# Patient Record
Sex: Female | Born: 1945
Health system: Southern US, Community
[De-identification: ages and names within clinical notes are randomized; demographics above are authoritative.]

## PROBLEM LIST (undated history)

## (undated) DIAGNOSIS — Z9889 Other specified postprocedural states: Secondary | ICD-10-CM

## (undated) DIAGNOSIS — I10 Essential (primary) hypertension: Secondary | ICD-10-CM

## (undated) DIAGNOSIS — M545 Low back pain, unspecified: Secondary | ICD-10-CM

## (undated) DIAGNOSIS — E7800 Pure hypercholesterolemia, unspecified: Secondary | ICD-10-CM

## (undated) DIAGNOSIS — D649 Anemia, unspecified: Secondary | ICD-10-CM

## (undated) DIAGNOSIS — T7840XA Allergy, unspecified, initial encounter: Secondary | ICD-10-CM

## (undated) DIAGNOSIS — R112 Nausea with vomiting, unspecified: Secondary | ICD-10-CM

## (undated) DIAGNOSIS — K635 Polyp of colon: Secondary | ICD-10-CM

## (undated) DIAGNOSIS — G8929 Other chronic pain: Secondary | ICD-10-CM

## (undated) DIAGNOSIS — M1990 Unspecified osteoarthritis, unspecified site: Secondary | ICD-10-CM

## (undated) DIAGNOSIS — W010XXA Fall on same level from slipping, tripping and stumbling without subsequent striking against object, initial encounter: Secondary | ICD-10-CM

## (undated) DIAGNOSIS — K219 Gastro-esophageal reflux disease without esophagitis: Secondary | ICD-10-CM

## (undated) DIAGNOSIS — Z8719 Personal history of other diseases of the digestive system: Secondary | ICD-10-CM

## (undated) HISTORY — DX: Allergy, unspecified, initial encounter: T78.40XA

## (undated) HISTORY — PX: RADIOFREQUENCY ABLATION NERVES: SUR1070

## (undated) HISTORY — PX: JOINT REPLACEMENT: SHX530

## (undated) HISTORY — DX: Essential (primary) hypertension: I10

## (undated) HISTORY — DX: Unspecified osteoarthritis, unspecified site: M19.90

## (undated) HISTORY — DX: Polyp of colon: K63.5

## (undated) HISTORY — PX: FRACTURE SURGERY: SHX138

---

## 1983-03-16 HISTORY — PX: ANKLE FRACTURE SURGERY: SHX122

## 1984-03-15 HISTORY — PX: ANKLE HARDWARE REMOVAL: SHX1149

## 1984-03-15 HISTORY — PX: FOOT FRACTURE SURGERY: SHX645

## 1997-03-15 HISTORY — PX: ABDOMINAL HYSTERECTOMY: SHX81

## 1997-05-08 ENCOUNTER — Inpatient Hospital Stay (HOSPITAL_COMMUNITY): Admission: RE | Admit: 1997-05-08 | Discharge: 1997-05-10 | Payer: Self-pay | Admitting: Gynecology

## 1997-06-07 ENCOUNTER — Ambulatory Visit (HOSPITAL_COMMUNITY): Admission: RE | Admit: 1997-06-07 | Discharge: 1997-06-07 | Payer: Self-pay | Admitting: Gynecology

## 1997-10-30 ENCOUNTER — Ambulatory Visit (HOSPITAL_COMMUNITY): Admission: RE | Admit: 1997-10-30 | Discharge: 1997-10-30 | Payer: Self-pay | Admitting: Gastroenterology

## 1998-06-13 ENCOUNTER — Other Ambulatory Visit: Admission: RE | Admit: 1998-06-13 | Discharge: 1998-06-13 | Payer: Self-pay | Admitting: Gynecology

## 1998-06-18 ENCOUNTER — Encounter: Payer: Self-pay | Admitting: Gynecology

## 1998-06-18 ENCOUNTER — Ambulatory Visit (HOSPITAL_COMMUNITY): Admission: RE | Admit: 1998-06-18 | Discharge: 1998-06-18 | Payer: Self-pay | Admitting: Gynecology

## 1999-03-16 HISTORY — PX: KNEE ARTHROSCOPY: SHX127

## 1999-11-03 ENCOUNTER — Ambulatory Visit (HOSPITAL_COMMUNITY): Admission: RE | Admit: 1999-11-03 | Discharge: 1999-11-03 | Payer: Self-pay | Admitting: Gynecology

## 1999-11-03 ENCOUNTER — Encounter: Payer: Self-pay | Admitting: Gynecology

## 2000-03-15 HISTORY — PX: HAMMER TOE SURGERY: SHX385

## 2000-11-15 ENCOUNTER — Encounter: Payer: Self-pay | Admitting: Gynecology

## 2000-11-15 ENCOUNTER — Ambulatory Visit (HOSPITAL_COMMUNITY): Admission: RE | Admit: 2000-11-15 | Discharge: 2000-11-15 | Payer: Self-pay | Admitting: Gynecology

## 2002-01-30 ENCOUNTER — Encounter: Payer: Self-pay | Admitting: Gynecology

## 2002-01-30 ENCOUNTER — Ambulatory Visit (HOSPITAL_COMMUNITY): Admission: RE | Admit: 2002-01-30 | Discharge: 2002-01-30 | Payer: Self-pay | Admitting: Gynecology

## 2002-10-05 ENCOUNTER — Other Ambulatory Visit: Admission: RE | Admit: 2002-10-05 | Discharge: 2002-10-05 | Payer: Self-pay | Admitting: Gynecology

## 2003-02-12 ENCOUNTER — Ambulatory Visit (HOSPITAL_COMMUNITY): Admission: RE | Admit: 2003-02-12 | Discharge: 2003-02-12 | Payer: Self-pay | Admitting: Gynecology

## 2004-02-18 ENCOUNTER — Ambulatory Visit (HOSPITAL_COMMUNITY): Admission: RE | Admit: 2004-02-18 | Discharge: 2004-02-18 | Payer: Self-pay | Admitting: Gynecology

## 2005-03-30 ENCOUNTER — Ambulatory Visit (HOSPITAL_COMMUNITY): Admission: RE | Admit: 2005-03-30 | Discharge: 2005-03-30 | Payer: Self-pay | Admitting: Gynecology

## 2006-04-25 ENCOUNTER — Ambulatory Visit (HOSPITAL_COMMUNITY): Admission: RE | Admit: 2006-04-25 | Discharge: 2006-04-25 | Payer: Self-pay | Admitting: Gynecology

## 2006-06-06 ENCOUNTER — Other Ambulatory Visit: Admission: RE | Admit: 2006-06-06 | Discharge: 2006-06-06 | Payer: Self-pay | Admitting: Gynecology

## 2007-05-02 ENCOUNTER — Ambulatory Visit (HOSPITAL_COMMUNITY): Admission: RE | Admit: 2007-05-02 | Discharge: 2007-05-02 | Payer: Self-pay | Admitting: Gynecology

## 2007-08-14 HISTORY — PX: COLONOSCOPY: SHX174

## 2008-05-16 ENCOUNTER — Ambulatory Visit (HOSPITAL_COMMUNITY): Admission: RE | Admit: 2008-05-16 | Discharge: 2008-05-16 | Payer: Self-pay | Admitting: Gynecology

## 2008-05-20 ENCOUNTER — Ambulatory Visit: Payer: Self-pay | Admitting: Gynecology

## 2008-05-20 ENCOUNTER — Other Ambulatory Visit: Admission: RE | Admit: 2008-05-20 | Discharge: 2008-05-20 | Payer: Self-pay | Admitting: Gynecology

## 2008-05-20 ENCOUNTER — Encounter: Payer: Self-pay | Admitting: Gynecology

## 2008-08-02 ENCOUNTER — Ambulatory Visit: Payer: Self-pay | Admitting: Gynecology

## 2009-06-12 ENCOUNTER — Ambulatory Visit (HOSPITAL_COMMUNITY): Admission: RE | Admit: 2009-06-12 | Discharge: 2009-06-12 | Payer: Self-pay | Admitting: Gynecology

## 2009-07-15 ENCOUNTER — Encounter: Admission: RE | Admit: 2009-07-15 | Discharge: 2009-07-15 | Payer: Self-pay | Admitting: Orthopedic Surgery

## 2009-08-06 ENCOUNTER — Encounter: Admission: RE | Admit: 2009-08-06 | Discharge: 2009-08-06 | Payer: Self-pay | Admitting: Orthopedic Surgery

## 2009-09-25 ENCOUNTER — Encounter: Admission: RE | Admit: 2009-09-25 | Discharge: 2009-09-25 | Payer: Self-pay | Admitting: Orthopedic Surgery

## 2009-10-13 DIAGNOSIS — K635 Polyp of colon: Secondary | ICD-10-CM | POA: Insufficient documentation

## 2009-10-13 HISTORY — DX: Polyp of colon: K63.5

## 2010-01-19 ENCOUNTER — Encounter: Admission: RE | Admit: 2010-01-19 | Discharge: 2010-01-19 | Payer: Self-pay | Admitting: Neurosurgery

## 2010-03-23 ENCOUNTER — Encounter
Admission: RE | Admit: 2010-03-23 | Discharge: 2010-03-23 | Payer: Self-pay | Source: Home / Self Care | Attending: Neurosurgery | Admitting: Neurosurgery

## 2010-06-05 ENCOUNTER — Other Ambulatory Visit: Payer: Self-pay | Admitting: Gynecology

## 2010-06-05 DIAGNOSIS — Z1231 Encounter for screening mammogram for malignant neoplasm of breast: Secondary | ICD-10-CM

## 2010-06-15 ENCOUNTER — Ambulatory Visit (HOSPITAL_COMMUNITY)
Admission: RE | Admit: 2010-06-15 | Discharge: 2010-06-15 | Disposition: A | Discharge status: Home / Self Care | Payer: Medicare Other | Source: Ambulatory Visit | Attending: Gynecology | Admitting: Gynecology

## 2010-06-15 DIAGNOSIS — Z1231 Encounter for screening mammogram for malignant neoplasm of breast: Secondary | ICD-10-CM

## 2010-06-17 ENCOUNTER — Other Ambulatory Visit: Payer: Self-pay | Admitting: Gynecology

## 2010-06-17 DIAGNOSIS — R928 Other abnormal and inconclusive findings on diagnostic imaging of breast: Secondary | ICD-10-CM

## 2010-06-19 ENCOUNTER — Ambulatory Visit
Admission: RE | Admit: 2010-06-19 | Discharge: 2010-06-19 | Disposition: A | Discharge status: Home / Self Care | Payer: Medicare Other | Source: Ambulatory Visit | Attending: Gynecology | Admitting: Gynecology

## 2010-06-19 DIAGNOSIS — R928 Other abnormal and inconclusive findings on diagnostic imaging of breast: Secondary | ICD-10-CM

## 2010-06-23 ENCOUNTER — Other Ambulatory Visit: Payer: Self-pay | Admitting: Orthopedic Surgery

## 2010-06-23 DIAGNOSIS — IMO0002 Reserved for concepts with insufficient information to code with codable children: Secondary | ICD-10-CM

## 2010-06-25 ENCOUNTER — Ambulatory Visit
Admission: RE | Admit: 2010-06-25 | Discharge: 2010-06-25 | Disposition: A | Discharge status: Home / Self Care | Payer: Medicare Other | Source: Ambulatory Visit | Attending: Orthopedic Surgery | Admitting: Orthopedic Surgery

## 2010-06-25 DIAGNOSIS — IMO0002 Reserved for concepts with insufficient information to code with codable children: Secondary | ICD-10-CM

## 2010-07-16 ENCOUNTER — Other Ambulatory Visit: Payer: Self-pay | Admitting: Orthopedic Surgery

## 2010-07-16 DIAGNOSIS — IMO0002 Reserved for concepts with insufficient information to code with codable children: Secondary | ICD-10-CM

## 2010-07-17 ENCOUNTER — Other Ambulatory Visit: Payer: Self-pay | Admitting: Orthopedic Surgery

## 2010-07-17 ENCOUNTER — Ambulatory Visit
Admission: RE | Admit: 2010-07-17 | Discharge: 2010-07-17 | Disposition: A | Discharge status: Home / Self Care | Payer: PRIVATE HEALTH INSURANCE | Source: Ambulatory Visit | Attending: Orthopedic Surgery | Admitting: Orthopedic Surgery

## 2010-07-17 DIAGNOSIS — M47816 Spondylosis without myelopathy or radiculopathy, lumbar region: Secondary | ICD-10-CM

## 2010-07-17 DIAGNOSIS — IMO0002 Reserved for concepts with insufficient information to code with codable children: Secondary | ICD-10-CM

## 2011-03-16 HISTORY — PX: POSTERIOR LUMBAR FUSION: SHX6036

## 2011-03-22 DIAGNOSIS — M545 Low back pain, unspecified: Secondary | ICD-10-CM | POA: Diagnosis not present

## 2011-03-22 DIAGNOSIS — IMO0002 Reserved for concepts with insufficient information to code with codable children: Secondary | ICD-10-CM | POA: Diagnosis not present

## 2011-03-23 ENCOUNTER — Ambulatory Visit (INDEPENDENT_AMBULATORY_CARE_PROVIDER_SITE_OTHER): Payer: Medicare Other

## 2011-03-23 DIAGNOSIS — L241 Irritant contact dermatitis due to oils and greases: Secondary | ICD-10-CM

## 2011-04-12 DIAGNOSIS — M545 Low back pain, unspecified: Secondary | ICD-10-CM | POA: Diagnosis not present

## 2011-04-12 DIAGNOSIS — IMO0002 Reserved for concepts with insufficient information to code with codable children: Secondary | ICD-10-CM | POA: Diagnosis not present

## 2011-05-03 DIAGNOSIS — M25569 Pain in unspecified knee: Secondary | ICD-10-CM | POA: Diagnosis not present

## 2011-05-03 DIAGNOSIS — M1710 Unilateral primary osteoarthritis, unspecified knee: Secondary | ICD-10-CM | POA: Diagnosis not present

## 2011-06-04 ENCOUNTER — Other Ambulatory Visit: Payer: Self-pay | Admitting: Gynecology

## 2011-06-04 DIAGNOSIS — Z1231 Encounter for screening mammogram for malignant neoplasm of breast: Secondary | ICD-10-CM

## 2011-06-07 DIAGNOSIS — H25019 Cortical age-related cataract, unspecified eye: Secondary | ICD-10-CM | POA: Diagnosis not present

## 2011-06-07 DIAGNOSIS — H0230 Blepharochalasis unspecified eye, unspecified eyelid: Secondary | ICD-10-CM | POA: Diagnosis not present

## 2011-06-10 DIAGNOSIS — M545 Low back pain, unspecified: Secondary | ICD-10-CM | POA: Diagnosis not present

## 2011-06-10 DIAGNOSIS — M4310 Spondylolisthesis, site unspecified: Secondary | ICD-10-CM | POA: Diagnosis not present

## 2011-06-10 DIAGNOSIS — M5137 Other intervertebral disc degeneration, lumbosacral region: Secondary | ICD-10-CM | POA: Diagnosis not present

## 2011-06-29 DIAGNOSIS — M25569 Pain in unspecified knee: Secondary | ICD-10-CM | POA: Diagnosis not present

## 2011-06-29 DIAGNOSIS — M1710 Unilateral primary osteoarthritis, unspecified knee: Secondary | ICD-10-CM | POA: Diagnosis not present

## 2011-06-30 ENCOUNTER — Ambulatory Visit (HOSPITAL_COMMUNITY)
Admission: RE | Admit: 2011-06-30 | Discharge: 2011-06-30 | Disposition: A | Discharge status: Home / Self Care | Payer: Medicare Other | Source: Ambulatory Visit | Attending: Gynecology | Admitting: Gynecology

## 2011-06-30 DIAGNOSIS — Z1231 Encounter for screening mammogram for malignant neoplasm of breast: Secondary | ICD-10-CM | POA: Insufficient documentation

## 2011-07-06 DIAGNOSIS — M1710 Unilateral primary osteoarthritis, unspecified knee: Secondary | ICD-10-CM | POA: Diagnosis not present

## 2011-07-13 DIAGNOSIS — M4310 Spondylolisthesis, site unspecified: Secondary | ICD-10-CM | POA: Diagnosis not present

## 2011-07-13 DIAGNOSIS — M545 Low back pain, unspecified: Secondary | ICD-10-CM | POA: Diagnosis not present

## 2011-07-13 DIAGNOSIS — M5137 Other intervertebral disc degeneration, lumbosacral region: Secondary | ICD-10-CM | POA: Diagnosis not present

## 2011-07-14 DIAGNOSIS — M1710 Unilateral primary osteoarthritis, unspecified knee: Secondary | ICD-10-CM | POA: Diagnosis not present

## 2011-07-16 ENCOUNTER — Ambulatory Visit (INDEPENDENT_AMBULATORY_CARE_PROVIDER_SITE_OTHER): Payer: Medicare Other | Admitting: Family Medicine

## 2011-07-16 VITALS — BP 150/75 | HR 62 | Temp 98.0°F | Resp 18 | Ht 69.0 in | Wt 214.0 lb

## 2011-07-16 DIAGNOSIS — G501 Atypical facial pain: Secondary | ICD-10-CM

## 2011-07-16 DIAGNOSIS — R519 Headache, unspecified: Secondary | ICD-10-CM

## 2011-07-21 DIAGNOSIS — M1710 Unilateral primary osteoarthritis, unspecified knee: Secondary | ICD-10-CM | POA: Diagnosis not present

## 2011-07-28 DIAGNOSIS — M1710 Unilateral primary osteoarthritis, unspecified knee: Secondary | ICD-10-CM | POA: Diagnosis not present

## 2011-08-11 DIAGNOSIS — IMO0002 Reserved for concepts with insufficient information to code with codable children: Secondary | ICD-10-CM | POA: Diagnosis not present

## 2011-08-11 DIAGNOSIS — M4310 Spondylolisthesis, site unspecified: Secondary | ICD-10-CM | POA: Diagnosis not present

## 2011-08-11 DIAGNOSIS — M5137 Other intervertebral disc degeneration, lumbosacral region: Secondary | ICD-10-CM | POA: Diagnosis not present

## 2011-08-16 DIAGNOSIS — M545 Low back pain, unspecified: Secondary | ICD-10-CM | POA: Diagnosis not present

## 2011-08-16 DIAGNOSIS — M4310 Spondylolisthesis, site unspecified: Secondary | ICD-10-CM | POA: Diagnosis not present

## 2011-08-16 DIAGNOSIS — IMO0002 Reserved for concepts with insufficient information to code with codable children: Secondary | ICD-10-CM | POA: Diagnosis not present

## 2011-09-13 DIAGNOSIS — M545 Low back pain, unspecified: Secondary | ICD-10-CM | POA: Diagnosis not present

## 2011-09-13 DIAGNOSIS — M4310 Spondylolisthesis, site unspecified: Secondary | ICD-10-CM | POA: Diagnosis not present

## 2011-09-13 DIAGNOSIS — IMO0002 Reserved for concepts with insufficient information to code with codable children: Secondary | ICD-10-CM | POA: Diagnosis not present

## 2011-09-23 DIAGNOSIS — M5137 Other intervertebral disc degeneration, lumbosacral region: Secondary | ICD-10-CM | POA: Diagnosis not present

## 2011-09-23 DIAGNOSIS — M1710 Unilateral primary osteoarthritis, unspecified knee: Secondary | ICD-10-CM | POA: Diagnosis not present

## 2011-10-15 ENCOUNTER — Encounter: Payer: Self-pay | Admitting: Family Medicine

## 2011-10-18 DIAGNOSIS — M5137 Other intervertebral disc degeneration, lumbosacral region: Secondary | ICD-10-CM | POA: Diagnosis not present

## 2011-10-18 DIAGNOSIS — M4310 Spondylolisthesis, site unspecified: Secondary | ICD-10-CM | POA: Diagnosis not present

## 2011-10-18 DIAGNOSIS — IMO0002 Reserved for concepts with insufficient information to code with codable children: Secondary | ICD-10-CM | POA: Diagnosis not present

## 2011-10-18 DIAGNOSIS — M545 Low back pain, unspecified: Secondary | ICD-10-CM | POA: Diagnosis not present

## 2011-11-03 DIAGNOSIS — M4310 Spondylolisthesis, site unspecified: Secondary | ICD-10-CM | POA: Diagnosis not present

## 2011-11-03 DIAGNOSIS — M5137 Other intervertebral disc degeneration, lumbosacral region: Secondary | ICD-10-CM | POA: Diagnosis not present

## 2011-11-03 DIAGNOSIS — M545 Low back pain, unspecified: Secondary | ICD-10-CM | POA: Diagnosis not present

## 2011-11-03 DIAGNOSIS — IMO0002 Reserved for concepts with insufficient information to code with codable children: Secondary | ICD-10-CM | POA: Diagnosis not present

## 2011-11-27 DIAGNOSIS — Z23 Encounter for immunization: Secondary | ICD-10-CM | POA: Diagnosis not present

## 2011-11-30 DIAGNOSIS — M1710 Unilateral primary osteoarthritis, unspecified knee: Secondary | ICD-10-CM | POA: Diagnosis not present

## 2011-11-30 DIAGNOSIS — M5137 Other intervertebral disc degeneration, lumbosacral region: Secondary | ICD-10-CM | POA: Diagnosis not present

## 2011-12-15 ENCOUNTER — Other Ambulatory Visit: Payer: Self-pay | Admitting: Rehabilitation

## 2011-12-15 ENCOUNTER — Ambulatory Visit
Admission: RE | Admit: 2011-12-15 | Discharge: 2011-12-15 | Disposition: A | Discharge status: Home / Self Care | Payer: Medicare Other | Source: Ambulatory Visit | Attending: Rehabilitation | Admitting: Rehabilitation

## 2011-12-15 ENCOUNTER — Other Ambulatory Visit: Payer: Medicare Other

## 2011-12-15 DIAGNOSIS — M545 Low back pain, unspecified: Secondary | ICD-10-CM | POA: Insufficient documentation

## 2011-12-15 DIAGNOSIS — M5137 Other intervertebral disc degeneration, lumbosacral region: Secondary | ICD-10-CM

## 2011-12-15 DIAGNOSIS — M48061 Spinal stenosis, lumbar region without neurogenic claudication: Secondary | ICD-10-CM | POA: Diagnosis not present

## 2011-12-15 DIAGNOSIS — M4310 Spondylolisthesis, site unspecified: Secondary | ICD-10-CM | POA: Diagnosis not present

## 2011-12-15 HISTORY — DX: Low back pain, unspecified: M54.50

## 2011-12-20 DIAGNOSIS — M4310 Spondylolisthesis, site unspecified: Secondary | ICD-10-CM | POA: Diagnosis not present

## 2011-12-20 DIAGNOSIS — M545 Low back pain, unspecified: Secondary | ICD-10-CM | POA: Diagnosis not present

## 2011-12-20 DIAGNOSIS — IMO0002 Reserved for concepts with insufficient information to code with codable children: Secondary | ICD-10-CM | POA: Diagnosis not present

## 2011-12-20 DIAGNOSIS — M5137 Other intervertebral disc degeneration, lumbosacral region: Secondary | ICD-10-CM | POA: Diagnosis not present

## 2011-12-24 ENCOUNTER — Encounter: Payer: Self-pay | Admitting: Women's Health

## 2011-12-24 ENCOUNTER — Ambulatory Visit (INDEPENDENT_AMBULATORY_CARE_PROVIDER_SITE_OTHER): Payer: Medicare Other | Admitting: Women's Health

## 2011-12-24 DIAGNOSIS — N898 Other specified noninflammatory disorders of vagina: Secondary | ICD-10-CM

## 2011-12-24 DIAGNOSIS — L293 Anogenital pruritus, unspecified: Secondary | ICD-10-CM

## 2011-12-24 LAB — WET PREP FOR TRICH, YEAST, CLUE
Clue Cells Wet Prep HPF POC: NONE SEEN
Trich, Wet Prep: NONE SEEN
Yeast Wet Prep HPF POC: NONE SEEN

## 2011-12-24 NOTE — Progress Notes (Signed)
Patient ID: Katherine Mcpherson, female   DOB: 03/31/1945, 66 y.o.   MRN: [id number] Presents with complaint of vaginal itching and burning with slight pelvic pressure for greater than one week. Burning its most problematic symptom. Used over-the-counter Monistat without relief. Denies any urinary symptoms, discharge or fever. History of a hysterectomy, on no ERT, not sexually active.  Exam: External genitalia erythematous at introitus, speculum exam vaginal walls atrophic with no noted discharge or erythema, wet prep negative. Bimanual no adnexal fullness or tenderness.  Atrophic vaginitis  Plan: Estrace vaginal cream half applicator twice weekly. Sample given with no prescription. Instructed to call or return if no relief of symptoms, has annual exam with Dr. Lily Peer next week and will evaluate.

## 2011-12-29 ENCOUNTER — Encounter: Payer: Self-pay | Admitting: Anesthesiology

## 2011-12-29 DIAGNOSIS — M1710 Unilateral primary osteoarthritis, unspecified knee: Secondary | ICD-10-CM | POA: Diagnosis not present

## 2011-12-30 ENCOUNTER — Encounter: Payer: Self-pay | Admitting: Gynecology

## 2011-12-30 ENCOUNTER — Ambulatory Visit (INDEPENDENT_AMBULATORY_CARE_PROVIDER_SITE_OTHER): Payer: Medicare Other | Admitting: Gynecology

## 2011-12-30 VITALS — BP 132/86 | Ht 67.25 in | Wt 212.0 lb

## 2011-12-30 DIAGNOSIS — E669 Obesity, unspecified: Secondary | ICD-10-CM

## 2011-12-30 DIAGNOSIS — I119 Hypertensive heart disease without heart failure: Secondary | ICD-10-CM

## 2011-12-30 DIAGNOSIS — E663 Overweight: Secondary | ICD-10-CM

## 2011-12-30 DIAGNOSIS — E66811 Obesity, class 1: Secondary | ICD-10-CM

## 2011-12-30 DIAGNOSIS — I10 Essential (primary) hypertension: Secondary | ICD-10-CM | POA: Diagnosis not present

## 2011-12-30 DIAGNOSIS — N951 Menopausal and female climacteric states: Secondary | ICD-10-CM

## 2011-12-30 DIAGNOSIS — N952 Postmenopausal atrophic vaginitis: Secondary | ICD-10-CM | POA: Diagnosis not present

## 2011-12-30 DIAGNOSIS — Z23 Encounter for immunization: Secondary | ICD-10-CM | POA: Diagnosis not present

## 2011-12-30 HISTORY — DX: Obesity, unspecified: E66.9

## 2011-12-30 HISTORY — DX: Postmenopausal atrophic vaginitis: N95.2

## 2011-12-30 HISTORY — DX: Hypertensive heart disease without heart failure: I11.9

## 2011-12-30 HISTORY — DX: Menopausal and female climacteric states: N95.1

## 2011-12-30 HISTORY — DX: Obesity, class 1: E66.811

## 2011-12-30 MED ORDER — ESTRADIOL 0.1 MG/GM VA CREA: TOPICAL_CREAM | VAGINAL | Status: DC

## 2011-12-30 NOTE — Progress Notes (Signed)
Katherine Mcpherson [birth year] [id number]   History:    66 y.o.  who is postmenopausal has been complaining of vaginal dryness and irritation. Review of her record indicated her last time she was seen in the office was in 2010 and had a normal Pap smear. Patient with no prior history of abnormal Pap smears. Patient had a colonoscopy 2011 whereby: Polyp was identified and was benign. Dr. Yong Channel is her primary physician will be drawn her lab work in the next couple weeks. Her last mammogram was this year which was normal. Patient does her monthly self breast examination. Patient with prior history of total abdominal hysterectomy with bilateral salpingo-oophorectomy. Patient has already received previously her flu vaccine. Mother with history of osteoporosis. Patient does not recall ever having received a Tdap vaccine. Review of her record indicates she was weighing 231 pounds down to 212. Blood pressure 132/88.  Past medical history,surgical history, family history and social history were all reviewed and documented in the EPIC chart.  Gynecologic History No LMP recorded. Patient has had a hysterectomy. Contraception: none and Prior hysterectomy Last Pap: 2010. Results were: normal Last mammogram: 2013. Results were: normal  Obstetric History OB History    Grav Para Term Preterm Abortions TAB SAB Ect Mult Living   0                ROS: A ROS was performed and pertinent positives and negatives are included in the history.  GENERAL: No fevers or chills. HEENT: No change in vision, no earache, sore throat or sinus congestion. NECK: No pain or stiffness. CARDIOVASCULAR: No chest pain or pressure. No palpitations. PULMONARY: No shortness of breath, cough or wheeze. GASTROINTESTINAL: No abdominal pain, nausea, vomiting or diarrhea, melena or bright red blood per rectum. GENITOURINARY: No urinary frequency, urgency, hesitancy or dysuria. MUSCULOSKELETAL: No joint or muscle pain, no back pain, no recent  trauma. DERMATOLOGIC: No rash, no itching, no lesions. ENDOCRINE: No polyuria, polydipsia, no heat or cold intolerance. No recent change in weight. HEMATOLOGICAL: No anemia or easy bruising or bleeding. NEUROLOGIC: No headache, seizures, numbness, tingling or weakness. PSYCHIATRIC: No depression, no loss of interest in normal activity or change in sleep pattern.     Exam: chaperone present  BP 132/86  Ht 5' 7.25" (1.708 m)  Wt 212 lb (96.163 kg)  BMI 32.96 kg/m2  Body mass index is 32.96 kg/(m^2).  General appearance : Well developed well nourished female. No acute distress HEENT: Neck supple, trachea midline, no carotid bruits, no thyroidmegaly Lungs: Clear to auscultation, no rhonchi or wheezes, or rib retractions  Heart: Regular rate and rhythm, no murmurs or gallops Breast:Examined in sitting and supine position were symmetrical in appearance, no palpable masses or tenderness,  no skin retraction, no nipple inversion, no nipple discharge, no skin discoloration, no axillary or supraclavicular lymphadenopathy Abdomen: no palpable masses or tenderness, no rebound or guarding Extremities: no edema or skin discoloration or tenderness  Pelvic:  Bartholin, Urethra, Skene Glands: Within normal limits             Vagina: No gross lesions or discharge  Cervix: Absent  Uterus  Absent  Adnexa  Without masses or tenderness  Anus and perineum  normal   Rectovaginal  normal sphincter tone without palpated masses or tenderness             Hemoccult cards provided     Assessment/Plan:  66 y.o. postmenopausal with evidence of vaginal atrophy. Patient will be placed on Estrace vaginal  cream to apply twice a week. She will schedule her bone density study. Her Tdap vaccine was administered today. She was reminded to do her monthly self breast examination. We discussed importance of calcium and vitamin D with regular exercise for osteoporosis prevention. Prescription for Estrace vaginal cream to apply  intravaginally twice a week was provided. Patient with no prior history of abnormal Pap smear. Patient with prior history of total abdominal hysterectomy and bilateral salpingo-oophorectomy for benign pathology and would know prior history of abnormal Pap smears will no longer need  Pap smears according to the new guidelines. Patient was reminded to submit to the office for Hemoccult cards for testing.    Ok Edwards MD, 4:00 PM 12/30/2011

## 2011-12-30 NOTE — Patient Instructions (Addendum)
Tetanus, Diphtheria, Pertussis (Tdap) Vaccine What You Need to Know WHY GET VACCINATED? Tetanus, diphtheria and pertussis can be very serious diseases, even for adolescents and adults. Tdap vaccine can protect us from these diseases. TETANUS (Lockjaw) causes painful muscle tightening and stiffness, usually all over the body.  It can lead to tightening of muscles in the head and neck so you can't open your mouth, swallow, or sometimes even breathe. Tetanus kills about 1 out of 5 people who are infected. DIPHTHERIA can cause a thick coating to form in the back of the throat.  It can lead to breathing problems, paralysis, heart failure, and death. PERTUSSIS (Whooping Cough) causes severe coughing spells, which can cause difficulty breathing, vomiting and disturbed sleep.  It can also lead to weight loss, incontinence, and rib fractures. Up to 2 in 100 adolescents and 5 in 100 adults with pertussis are hospitalized or have complications, which could include pneumonia and death. These diseases are caused by bacteria. Diphtheria and pertussis are spread from person to person through coughing or sneezing. Tetanus enters the body through cuts, scratches, or wounds. Before vaccines, the [place] saw as many as 200,000 cases a year of diphtheria and pertussis, and hundreds of cases of tetanus. Since vaccination began, tetanus and diphtheria have dropped by about 99% and pertussis by about 80%. TDAP VACCINE Tdap vaccine can protect adolescents and adults from tetanus, diphtheria, and pertussis. One dose of Tdap is routinely given at age 11 or 12. People who did not get Tdap at that age should get it as soon as possible. Tdap is especially important for health care professionals and anyone having close contact with a baby younger than 12 months. Pregnant women should get a dose of Tdap during every pregnancy, to protect the newborn from pertussis. Infants are most at risk for severe, life-threatening  complications from pertussis. A similar vaccine, called Td, protects from tetanus and diphtheria, but not pertussis. A Td booster should be given every 10 years. Tdap may be given as one of these boosters if you have not already gotten a dose. Tdap may also be given after a severe cut or burn to prevent tetanus infection. Your doctor can give you more information. Tdap may safely be given at the same time as other vaccines. SOME PEOPLE SHOULD NOT GET THIS VACCINE  If you ever had a life-threatening allergic reaction after a dose of any tetanus, diphtheria, or pertussis containing vaccine, OR if you have a severe allergy to any part of this vaccine, you should not get Tdap. Tell your doctor if you have any severe allergies.  If you had a coma, or long or multiple seizures within 7 days after a childhood dose of DTP or DTaP, you should not get Tdap, unless a cause other than the vaccine was found. You can still get Td.  Talk to your doctor if you:  have epilepsy or another nervous system problem,  had severe pain or swelling after any vaccine containing diphtheria, tetanus or pertussis,  ever had Guillain-Barr Syndrome (GBS),  aren't feeling well on the day the shot is scheduled. RISKS OF A VACCINE REACTION With any medicine, including vaccines, there is a chance of side effects. These are usually mild and go away on their own, but serious reactions are also possible. Brief fainting spells can follow a vaccination, leading to injuries from falling. Sitting or lying down for about 15 minutes can help prevent these. Tell your doctor if you feel dizzy or light-headed, or   have vision changes or ringing in the ears. Mild problems following Tdap (Did not interfere with activities)  Pain where the shot was given (about 3 in 4 adolescents or 2 in 3 adults)  Redness or swelling where the shot was given (about 1 person in 5)  Mild fever of at least 100.4F (up to about 1 in 25 adolescents or 1 in  100 adults)  Headache (about 3 or 4 people in 10)  Tiredness (about 1 person in 3 or 4)  Nausea, vomiting, diarrhea, stomach ache (up to 1 in 4 adolescents or 1 in 10 adults)  Chills, body aches, sore joints, rash, swollen glands (uncommon) Moderate problems following Tdap (Interfered with activities, but did not require medical attention)  Pain where the shot was given (about 1 in 5 adolescents or 1 in 100 adults)  Redness or swelling where the shot was given (up to about 1 in 16 adolescents or 1 in 25 adults)  Fever over 102F (about 1 in 100 adolescents or 1 in 250 adults)  Headache (about 3 in 20 adolescents or 1 in 10 adults)  Nausea, vomiting, diarrhea, stomach ache (up to 1 or 3 people in 100)  Swelling of the entire arm where the shot was given (up to about 3 in 100). Severe problems following Tdap (Unable to perform usual activities, required medical attention)  Swelling, severe pain, bleeding and redness in the arm where the shot was given (rare). A severe allergic reaction could occur after any vaccine (estimated less than 1 in a million doses). WHAT IF THERE IS A SERIOUS REACTION? What should I look for?  Look for anything that concerns you, such as signs of a severe allergic reaction, very high fever, or behavior changes. Signs of a severe allergic reaction can include hives, swelling of the face and throat, difficulty breathing, a fast heartbeat, dizziness, and weakness. These would start a few minutes to a few hours after the vaccination. What should I do?  If you think it is a severe allergic reaction or other emergency that can't wait, call 9-1-1 or get the person to the nearest hospital. Otherwise, call your doctor.  Afterward, the reaction should be reported to the "Vaccine Adverse Event Reporting System" (VAERS). Your doctor might file this report, or you can do it yourself through the VAERS web site at www.vaers.hhs.gov, or by calling 1-800-822-7967. VAERS is  only for reporting reactions. They do not give medical advice.  THE NATIONAL VACCINE INJURY COMPENSATION PROGRAM The National Vaccine Injury Compensation Program (VICP) is a federal program that was created to compensate people who may have been injured by certain vaccines. Persons who believe they may have been injured by a vaccine can learn about the program and about filing a claim by calling 1-800-338-2382 or visiting the VICP website at www.hrsa.gov/vaccinecompensation. HOW CAN I LEARN MORE?  Ask your doctor.  Call your local or state health department.  Contact the Centers for Disease Control and Prevention (CDC):  Call 1-800-232-4636 or visit CDC's website at www.cdc.gov/vaccines CDC Tdap Vaccine VIS (07/22/11) Document Released: 08/31/2011 Document Reviewed: 08/31/2011 [employer] Patient Information 2013 [employer].   Hormone Therapy At menopause, your body begins making less estrogen and progesterone hormones. This causes the body to stop having menstrual periods. This is because estrogen and progesterone hormones control your periods and menstrual cycle. A lack of estrogen may cause symptoms such as:  Hot flushes (or hot flashes).  Vaginal dryness.  Dry skin.  Loss of sex drive.    Risk of bone loss (osteoporosis). When this happens, you may choose to take hormone therapy to get back the estrogen lost during menopause. When the hormone estrogen is given alone, it is usually referred to as ET (Estrogen Therapy). When the hormone progestin is combined with estrogen, it is generally called HT (Hormone Therapy). This was formerly known as hormone replacement therapy (HRT). Your caregiver can help you make a decision on what will be best for you. The decision to use HT seems to change often as new studies are done. Many studies do not agree on the benefits of hormone replacement therapy. LIKELY BENEFITS OF HT INCLUDE PROTECTION FROM:  Hot Flushes (also called hot flashes) - A  hot flush is a sudden feeling of heat that spreads over the face and body. The skin may redden like a blush. It is connected with sweats and sleep disturbance. Women going through menopause may have hot flushes a few times a month or several times per day depending on the woman.  Osteoporosis (bone loss)- Estrogen helps guard against bone loss. After menopause, a woman's bones slowly lose calcium and become weak and brittle. As a result, bones are more likely to break. The hip, wrist, and spine are affected most often. Hormone therapy can help slow bone loss after menopause. Weight bearing exercise and taking calcium with vitamin D also can help prevent bone loss. There are also medications that your caregiver can prescribe that can help prevent osteoporosis.  Vaginal Dryness - Loss of estrogen causes changes in the vagina. Its lining may become thin and dry. These changes can cause pain and bleeding during sexual intercourse. Dryness can also lead to infections. This can cause burning and itching. (Vaginal estrogen treatment can help relieve pain, itching, and dryness.)  Urinary Tract Infections are more common after menopause because of lack of estrogen. Some women also develop urinary incontinence because of low estrogen levels in the vagina and bladder.  Possible other benefits of estrogen include a positive effect on mood and short-term memory in women. RISKS AND COMPLICATIONS  Using estrogen alone without progesterone causes the lining of the uterus to grow. This increases the risk of lining of the uterus (endometrial) cancer. Your caregiver should give another hormone called progestin if you have a uterus.  Women who take combined (estrogen and progestin) HT appear to have an increased risk of breast cancer. The risk appears to be small, but increases throughout the time that HT is taken.  Combined therapy also makes the breast tissue slightly denser which makes it harder to read mammograms  (breast X-rays).  Combined, estrogen and progesterone therapy can be taken together every day, in which case there may be spotting of blood. HT therapy can be taken cyclically in which case you will have menstrual periods. Cyclically means HT is taken for a set amount of days, then not taken, then this process is repeated.  HT may increase the risk of stroke, heart attack, breast cancer and forming blood clots in your leg.  Transdermal estrogen (estrogen that is absorbed through the skin with a patch or a cream) may have more positive results with:  Cholesterol.  Blood pressure.  Blood clots. Having the following conditions may indicate you should not have HT:  Endometrial cancer.  Liver disease.  Breast cancer.  Heart disease.  History of blood clots.  Stroke. TREATMENT   If you choose to take HT and have a uterus, usually estrogen and progestin are prescribed.  Your caregiver will   help you decide the best way to take the medications.  Possible ways to take estrogen include:  Pills.  Patches.  Gels.  Sprays.  Vaginal estrogen cream, rings and tablets.  It is best to take the lowest dose possible that will help your symptoms and take them for the shortest period of time that you can.  Hormone therapy can help relieve some of the problems (symptoms) that affect women at menopause. Before making a decision about HT, talk to your caregiver about what is best for you. Be well informed and comfortable with your decisions. HOME CARE INSTRUCTIONS   Follow your caregivers advice when taking the medications.  A Pap test is done to screen for cervical cancer.  The first Pap test should be done at age 21.  Between ages 21 and 29, Pap tests are repeated every 2 years.  Beginning at age 30, you are advised to have a Pap test every 3 years as long as your past 3 Pap tests have been normal.  Some women have medical problems that increase the chance of getting cervical  cancer. Talk to your caregiver about these problems. It is especially important to talk to your caregiver if a new problem develops soon after your last Pap test. In these cases, your caregiver may recommend more frequent screening and Pap tests.  The above recommendations are the same for women who have or have not gotten the vaccine for HPV (Human Papillomavirus).  If you had a hysterectomy for a problem that was not a cancer or a condition that could lead to cancer, then you no longer need Pap tests. However, even if you no longer need a Pap test, a regular exam is a good idea to make sure no other problems are starting.   If you are between ages 65 and 70, and you have had normal Pap tests going back 10 years, you no longer need Pap tests. However, even if you no longer need a Pap test, a regular exam is a good idea to make sure no other problems are starting.   If you have had past treatment for cervical cancer or a condition that could lead to cancer, you need Pap tests and screening for cancer for at least 20 years after your treatment.  If Pap tests have been discontinued, risk factors (such as a new sexual partner) need to be re-assessed to determine if screening should be resumed.  Some women may need screenings more often if they are at high risk for cervical cancer.  Get mammograms done as per the advice of your caregiver. SEEK IMMEDIATE MEDICAL CARE IF:  You develop abnormal vaginal bleeding.  You have pain or swelling in your legs, shortness of breath, or chest pain.  You develop dizziness or headaches.  You have lumps or changes in your breasts or armpits.  You have slurred speech.  You develop weakness or numbness of your arms or legs.  You have pain, burning, or bleeding when urinating.  You develop abdominal pain. Document Released: 11/28/2002 Document Revised: 05/24/2011 Document Reviewed: 03/18/2010 Exit[employer] Patient Information 2013 [employer].  

## 2012-01-04 ENCOUNTER — Encounter: Payer: Self-pay | Admitting: Family Medicine

## 2012-01-04 DIAGNOSIS — M545 Low back pain, unspecified: Secondary | ICD-10-CM

## 2012-01-05 ENCOUNTER — Encounter: Payer: Self-pay | Admitting: Gynecology

## 2012-01-05 DIAGNOSIS — M1710 Unilateral primary osteoarthritis, unspecified knee: Secondary | ICD-10-CM | POA: Diagnosis not present

## 2012-01-10 ENCOUNTER — Telehealth: Payer: Self-pay | Admitting: Family Medicine

## 2012-01-10 NOTE — Telephone Encounter (Signed)
This is an office note from 07/2011 see by Dr. Conley Rolls when epic went down.  HPI: 67 y/o female  with 1 day h/o  right facial pain described as aching, 3-8/10 , intermittent pain. Possible popping and clicking. + right ear pain, and sinus pressure and underneath eye pain. + sensitivity in area with drinking hot and cold foods. She had dental work done 6 weeks ago, she denies any HA or confusion. She has a steroid injection by Dr. Shawnee Knapp 4 days ago. She has had a h/p shingles in scalp in 2007/2008. She has tried Tylenol and Relafen without relief. So she took oxycodone which helped. Denies h/o temproal arteritis. Denies jaw pain with opening and closing of moth but feels a pop, click. Denies CVA sxs.   Please see paper chart for full ROS, PMH, SHX, and FHx.  Please see paper chart for VS PE: Full PE in paper chart GEN: NCAT HEENT: n+ slightly erythematous ext canal, dentition is good, + pop and click of TMJ bialterally, no swelling or redness, nontender along temporal artery CV-RRR, no MRG Lungs: CTAB Abd: soft NT, no masses, no hepato or splenomegaly, + BS Neuro: CN 2-12 grossly nl, + sensation face, nontender  A/P: ? Etiology right facial pain ( dental vs TMJ vs sinusitis related otlagia vs trigeminal neuralgia vs temporal arteritis). I think it is less likely that it is TN or TA.  Monitor sxs If worsen then f/u prn Continue with gabapentin she already has, will call me in AM with sxs  F/u with dentist, consider abx if sxs worsen with fevers, or s/sx of infection

## 2012-01-11 DIAGNOSIS — M1710 Unilateral primary osteoarthritis, unspecified knee: Secondary | ICD-10-CM | POA: Diagnosis not present

## 2012-01-11 NOTE — Progress Notes (Signed)
This is an office note from 07/2011 see by Dr. Aarion Metzgar when epic went down.  HPI: 65 y/o female  with 1 day h/o  right facial pain described as aching, 3-8/10 , intermittent pain. Possible popping and clicking. + right ear pain, and sinus pressure and underneath eye pain. + sensitivity in area with drinking hot and cold foods. She had dental work done 6 weeks ago, she denies any HA or confusion. She has a steroid injection by Dr. Solow 4 days ago. She has had a h/p shingles in scalp in 2007/2008. She has tried Tylenol and Relafen without relief. So she took oxycodone which helped. Denies h/o temproal arteritis. Denies jaw pain with opening and closing of moth but feels a pop, click. Denies CVA sxs.   Please see paper chart for full ROS, PMH, SHX, and FHx.  Please see paper chart for VS PE: Full PE in paper chart GEN: NCAT HEENT: n+ slightly erythematous ext canal, dentition is good, + pop and click of TMJ bialterally, no swelling or redness, nontender along temporal artery CV-RRR, no MRG Lungs: CTAB Abd: soft NT, no masses, no hepato or splenomegaly, + BS Neuro: CN 2-12 grossly nl, + sensation face, nontender  A/P: ? Etiology right facial pain ( dental vs TMJ vs sinusitis related otlagia vs trigeminal neuralgia vs temporal arteritis). I think it is less likely that it is TN or TA.  Monitor sxs If worsen then f/u prn Continue with gabapentin she already has, will call me in AM with sxs  F/u with dentist, consider abx if sxs worsen with fevers, or s/sx of infection    

## 2012-01-14 HISTORY — PX: SPINE SURGERY: SHX786

## 2012-01-18 DIAGNOSIS — M1710 Unilateral primary osteoarthritis, unspecified knee: Secondary | ICD-10-CM | POA: Diagnosis not present

## 2012-01-25 DIAGNOSIS — M1710 Unilateral primary osteoarthritis, unspecified knee: Secondary | ICD-10-CM | POA: Diagnosis not present

## 2012-01-28 DIAGNOSIS — M545 Low back pain, unspecified: Secondary | ICD-10-CM | POA: Diagnosis not present

## 2012-01-28 DIAGNOSIS — M51379 Other intervertebral disc degeneration, lumbosacral region without mention of lumbar back pain or lower extremity pain: Secondary | ICD-10-CM | POA: Diagnosis present

## 2012-01-28 DIAGNOSIS — Z4789 Encounter for other orthopedic aftercare: Secondary | ICD-10-CM | POA: Diagnosis not present

## 2012-01-28 DIAGNOSIS — M47817 Spondylosis without myelopathy or radiculopathy, lumbosacral region: Secondary | ICD-10-CM | POA: Diagnosis not present

## 2012-01-28 DIAGNOSIS — IMO0002 Reserved for concepts with insufficient information to code with codable children: Secondary | ICD-10-CM | POA: Diagnosis not present

## 2012-01-28 DIAGNOSIS — M4310 Spondylolisthesis, site unspecified: Secondary | ICD-10-CM | POA: Diagnosis not present

## 2012-01-28 DIAGNOSIS — K449 Diaphragmatic hernia without obstruction or gangrene: Secondary | ICD-10-CM | POA: Diagnosis not present

## 2012-01-28 DIAGNOSIS — M4120 Other idiopathic scoliosis, site unspecified: Secondary | ICD-10-CM | POA: Diagnosis not present

## 2012-01-28 DIAGNOSIS — R110 Nausea: Secondary | ICD-10-CM | POA: Diagnosis not present

## 2012-01-28 DIAGNOSIS — I10 Essential (primary) hypertension: Secondary | ICD-10-CM | POA: Diagnosis not present

## 2012-01-28 DIAGNOSIS — Z981 Arthrodesis status: Secondary | ICD-10-CM | POA: Diagnosis not present

## 2012-01-28 DIAGNOSIS — M5137 Other intervertebral disc degeneration, lumbosacral region: Secondary | ICD-10-CM | POA: Diagnosis not present

## 2012-01-28 DIAGNOSIS — M5106 Intervertebral disc disorders with myelopathy, lumbar region: Secondary | ICD-10-CM | POA: Diagnosis not present

## 2012-01-28 DIAGNOSIS — M48061 Spinal stenosis, lumbar region without neurogenic claudication: Secondary | ICD-10-CM | POA: Diagnosis not present

## 2012-02-07 ENCOUNTER — Other Ambulatory Visit: Payer: Self-pay | Admitting: Internal Medicine

## 2012-02-09 DIAGNOSIS — M4310 Spondylolisthesis, site unspecified: Secondary | ICD-10-CM | POA: Diagnosis not present

## 2012-02-09 DIAGNOSIS — M48061 Spinal stenosis, lumbar region without neurogenic claudication: Secondary | ICD-10-CM | POA: Diagnosis not present

## 2012-02-09 DIAGNOSIS — M5137 Other intervertebral disc degeneration, lumbosacral region: Secondary | ICD-10-CM | POA: Diagnosis not present

## 2012-02-09 DIAGNOSIS — M5106 Intervertebral disc disorders with myelopathy, lumbar region: Secondary | ICD-10-CM | POA: Diagnosis not present

## 2012-02-28 DIAGNOSIS — M545 Low back pain, unspecified: Secondary | ICD-10-CM | POA: Diagnosis not present

## 2012-02-28 DIAGNOSIS — M48061 Spinal stenosis, lumbar region without neurogenic claudication: Secondary | ICD-10-CM | POA: Diagnosis not present

## 2012-03-10 DIAGNOSIS — M1710 Unilateral primary osteoarthritis, unspecified knee: Secondary | ICD-10-CM | POA: Diagnosis not present

## 2012-03-24 DIAGNOSIS — M1710 Unilateral primary osteoarthritis, unspecified knee: Secondary | ICD-10-CM | POA: Diagnosis not present

## 2012-03-30 DIAGNOSIS — M1710 Unilateral primary osteoarthritis, unspecified knee: Secondary | ICD-10-CM | POA: Diagnosis not present

## 2012-04-11 ENCOUNTER — Encounter: Payer: Self-pay | Admitting: Internal Medicine

## 2012-04-11 ENCOUNTER — Ambulatory Visit (INDEPENDENT_AMBULATORY_CARE_PROVIDER_SITE_OTHER): Payer: Medicare Other | Admitting: Family Medicine

## 2012-04-11 VITALS — BP 136/75 | HR 71 | Temp 97.6°F | Resp 18 | Ht 68.0 in | Wt 215.8 lb

## 2012-04-11 DIAGNOSIS — M5380 Other specified dorsopathies, site unspecified: Secondary | ICD-10-CM

## 2012-04-11 DIAGNOSIS — M545 Low back pain, unspecified: Secondary | ICD-10-CM

## 2012-04-11 DIAGNOSIS — I10 Essential (primary) hypertension: Secondary | ICD-10-CM

## 2012-04-11 DIAGNOSIS — M62830 Muscle spasm of back: Secondary | ICD-10-CM

## 2012-04-11 LAB — COMPREHENSIVE METABOLIC PANEL
ALT: 11 U/L (ref 0–35)
CO2: 27 mEq/L (ref 19–32)
Calcium: 9.1 mg/dL (ref 8.4–10.5)
Chloride: 106 mEq/L (ref 96–112)
Creat: 0.83 mg/dL (ref 0.50–1.10)
Glucose, Bld: 96 mg/dL (ref 70–99)
Total Bilirubin: 0.3 mg/dL (ref 0.3–1.2)
Total Protein: 6.5 g/dL (ref 6.0–8.3)

## 2012-04-11 LAB — POCT CBC
HCT, POC: 42.5 % (ref 37.7–47.9)
Hemoglobin: 12.7 g/dL (ref 12.2–16.2)
Lymph, poc: 1.8 (ref 0.6–3.4)
MCH, POC: 27.1 pg (ref 27–31.2)
MCHC: 29.9 g/dL — AB (ref 31.8–35.4)
MPV: 9 fL (ref 0–99.8)
RBC: 4.68 M/uL (ref 4.04–5.48)
WBC: 9.8 10*3/uL (ref 4.6–10.2)

## 2012-04-11 MED ORDER — LISINOPRIL 20 MG PO TABS: 20.0000 mg | ORAL_TABLET | Freq: Every day | ORAL | Status: DC

## 2012-04-11 NOTE — Progress Notes (Signed)
Subjective:    Patient ID: Katherine Mcpherson, female    DOB: March 06, 1946, 67 y.o.   MRN: [id number] Chief Complaint  Patient presents with  . Back Pain    lumbar area- constant pain hx of back surgery 9 weeks ago- not in the surgery area    HPI  Had back surgery 9 wks ago but really don't think this is realated as her back pain is on the opposite side - left side below ribs - at least 5-6 d ago, progressively worsened and today woke her from sleep - is constant pressure.  If she tries to lye down it is much worse.  If she lies on left side she can as long as her left leg is straigt but if she tries to bend her left leg it is worse - sitting here right now it is painful. Worse with walking yesterday.  No traumas since surgery.  She is taking stool softeners as she is on oxycodone for her back - yesterday she had weaned down to 7.5 bid - she is trying to wean off but had to take one in the middle of the night because of this pain.   Still having some constipation but resolved w/ dulculax.  Nml urination - no odor or color.  No n/v. Normal appetite.   She is not as active as she normally is because she is still recovering from her back surgery but she is trying to walk more. Unfortunately, he knees are also bone on bone.  Past Medical History  Diagnosis Date  . Hypertension   . Arthritis   . Polyp of colon 10/2009  . Allergy    Past Surgical History  Procedure Date  . Colonoscopy 08/2007    TUBULAR ADENOMAWITH DYSPLASIA  . Foot surgery 2002  . Knee surgery 2002  . Abdominal hysterectomy     TAH/BSO  . Ankle surgery   . Back surgery- fusion l5-s1 2013    Current Outpatient Prescriptions on File Prior to Visit  Medication Sig Dispense Refill  . cholecalciferol (VITAMIN D) 1000 UNITS tablet Take 1,000 Units by mouth daily.      Marland Kitchen lisinopril (PRINIVIL,ZESTRIL) 20 MG tablet Take 1 tablet (20 mg total) by mouth daily.  90 tablet  1  . Multiple Vitamin (MULTIVITAMIN) tablet Take 1 tablet by  mouth daily.      Marland Kitchen oxyCODONE-acetaminophen (PERCOCET) 7.5-325 MG per tablet Take 1 tablet by mouth every 4 (four) hours as needed.      Marland Kitchen estradiol (ESTRACE) 0.1 MG/GM vaginal cream Apply twice a week  42.5 g  10  . nabumetone (RELAFEN) 750 MG tablet Take 750 mg by mouth 2 (two) times daily.      . vitamin E 400 UNIT capsule Take 400 Units by mouth daily.       Allergies  Allergen Reactions  . Sulfa Antibiotics Other (See Comments)    Makes tongue feel funny and makes her feel like she will pass  Out.  Last for days.  . Penicillins Itching    Review of Systems  Constitutional: Positive for activity change. Negative for fever, chills and unexpected weight change.  Gastrointestinal: Positive for constipation. Negative for nausea, vomiting, abdominal pain, diarrhea, blood in stool and abdominal distention.  Genitourinary: Positive for flank pain. Negative for dysuria, urgency, frequency, hematuria, decreased urine volume, enuresis and difficulty urinating.  Musculoskeletal: Positive for myalgias, back pain, arthralgias and gait problem. Negative for joint swelling.  Skin: Negative for color change, rash  and wound.  Neurological: Negative for weakness and numbness.  Hematological: Negative for adenopathy. Does not bruise/bleed easily.  Psychiatric/Behavioral: Positive for sleep disturbance.      BP 136/75  Pulse 71  Temp 97.6 F (36.4 C) (Oral)  Resp 18  Ht 5\' 8"  (1.727 m)  Wt 215 lb 12.8 oz (97.886 kg)  BMI 32.81 kg/m2  SpO2 97% Objective:   Physical Exam  Constitutional: She is oriented to person, place, and time. She appears well-developed and well-nourished.  HENT:  Head: Normocephalic.  Eyes: Conjunctivae normal are normal. No scleral icterus.  Neck: Normal range of motion. Neck supple.  Cardiovascular: Normal rate, regular rhythm and normal heart sounds.   Pulmonary/Chest: Effort normal and breath sounds normal. No respiratory distress.  Abdominal: Soft. Normal appearance  and bowel sounds are normal. There is no hepatosplenomegaly. There is no tenderness. There is no rigidity, no rebound, no guarding and no CVA tenderness. No hernia.  Musculoskeletal: Normal range of motion. She exhibits no edema.       Right hip: Normal.       Left hip: Normal.       Lumbar back: She exhibits tenderness and pain. She exhibits normal range of motion, no swelling, no edema, no deformity, no laceration and no spasm.       Back:       Pain to palpation along small area of left low thoracic rib and distally as highlighted in diagram  Neurological: She is alert and oriented to person, place, and time. She has normal strength. No sensory deficit. She exhibits normal muscle tone. Coordination and gait normal.  Skin: Skin is warm, dry and intact. No rash noted. She is not diaphoretic. No erythema.  Psychiatric: She has a normal mood and affect. Her behavior is normal.          Results for orders placed in visit on 04/11/12  POCT CBC      Component Value Range   WBC 9.8  4.6 - 10.2 K/uL   Lymph, poc 1.8  0.6 - 3.4   POC LYMPH PERCENT 18.7  10 - 50 %L   MID (cbc) 0.5  0 - 0.9   POC MID % 4.9  0 - 12 %M   POC Granulocyte 7.5 (*) 2 - 6.9   Granulocyte percent 76.4  37 - 80 %G   RBC 4.68  4.04 - 5.48 M/uL   Hemoglobin 12.7  12.2 - 16.2 g/dL   HCT, POC 46.9  62.9 - 47.9 %   MCV 90.8  80 - 97 fL   MCH, POC 27.1  27 - 31.2 pg   MCHC 29.9 (*) 31.8 - 35.4 g/dL   RDW, POC 52.8     Platelet Count, POC 363  142 - 424 K/uL   MPV 9.0  0 - 99.8 fL    Assessment & Plan:  Left low thoracic back pain - I suspect this is muscular in origin. She was methocarbamol 750mg  q6 hrs rx'd to her from her prev back injury so she will restart that in addition to frequent heat and try home thermal patch, topical diclofenac, capsaicin, massage.  If she needs a refill,  Ok to call in here and I will refill even though it was not initially rx'ed from this office. If no improvement, come back or call.  Will initially try switching pt to a different muscle relaxer - maybe something more potent. If not improvement with that, rec RTC for further eval inc  urine, cmp, esr, and consider imaging - perhaps xray and [place].  Will cc this note to pt's neurosurg Dr. Noel Gerold as pt plans to make a f/u appt with his office if no improvement after trial of treatment for muscle spasm   HTN - refill lisinopril - check bmp

## 2012-04-11 NOTE — Patient Instructions (Addendum)
Myalgia, Adult Myalgia is the medical term for muscle pain. It is a symptom of many things. Nearly everyone at some time in their life has this. The most common cause for muscle pain is overuse or straining and more so when you are not in shape. Injuries and muscle bruises cause myalgias. Muscle pain without a history of injury can also be caused by a virus. It frequently comes along with the flu. Myalgia not caused by muscle strain can be present in a large number of infectious diseases. Some autoimmune diseases like lupus and fibromyalgia can cause muscle pain. Myalgia may be mild, or severe. SYMPTOMS  The symptoms of myalgia are simply muscle pain. Most of the time this is short lived and the pain goes away without treatment. DIAGNOSIS  Myalgia is diagnosed by your caregiver by taking your history. This means you tell him when the problems began, what they are, and what has been happening. If this has not been a long term problem, your caregiver may want to watch for a while to see what will happen. If it has been long term, they may want to do additional testing. TREATMENT  The treatment depends on what the underlying cause of the muscle pain is. Often anti-inflammatory medications will help. HOME CARE INSTRUCTIONS  If the pain in your muscles came from overuse, slow down your activities until the problems go away.  Myalgia from overuse of a muscle can be treated with alternating hot and cold packs on the muscle affected or with cold for the first couple days. If either heat or cold seems to make things worse, stop their use.  Apply ice to the sore area for 15 to 20 minutes, 3 to 4 times per day, while awake for the first 2 days of muscle soreness, or as directed. Put the ice in a plastic bag and place a towel between the bag of ice and your skin.  Only take over-the-counter or prescription medicines for pain, discomfort, or fever as directed by your caregiver.  Regular gentle exercise may help  if you are not active.  Stretching before strenuous exercise can help lower the risk of myalgia. It is normal when beginning an exercise regimen to feel some muscle pain after exercising. Muscles that have not been used frequently will be sore at first. If the pain is extreme, this may mean injury to a muscle. SEEK MEDICAL CARE IF:  You have an increase in muscle pain that is not relieved with medication.  You begin to run a temperature.  You develop nausea and vomiting.  You develop a stiff and painful neck.  You develop a rash.  You develop muscle pain after a tick bite.  You have continued muscle pain while working out even after you are in good condition. SEEK IMMEDIATE MEDICAL CARE IF: Any of your problems are getting worse and medications are not helping. MAKE SURE YOU:   Understand these instructions.  Will watch your condition.  Will get help right away if you are not doing well or get worse. Document Released: 01/21/2006 Document Revised: 05/24/2011 Document Reviewed: 04/12/2006 [employer] Patient Information 2013 [employer].  

## 2012-04-13 ENCOUNTER — Other Ambulatory Visit: Payer: Self-pay | Admitting: Family Medicine

## 2012-04-13 ENCOUNTER — Encounter: Payer: Self-pay | Admitting: Family Medicine

## 2012-04-13 MED ORDER — CARISOPRODOL 350 MG PO TABS: 350.0000 mg | ORAL_TABLET | Freq: Four times a day (QID) | ORAL | Status: DC | PRN

## 2012-04-13 MED ORDER — DICLOFENAC SODIUM 75 MG PO TBEC: 75.0000 mg | DELAYED_RELEASE_TABLET | Freq: Every day | ORAL | Status: DC

## 2012-04-29 ENCOUNTER — Encounter: Payer: Self-pay | Admitting: Internal Medicine

## 2012-04-29 ENCOUNTER — Other Ambulatory Visit: Payer: Self-pay

## 2012-05-01 NOTE — Telephone Encounter (Signed)
You do not need another shingles vaccine. Please let us know if you need anything further.

## 2012-05-03 DIAGNOSIS — M545 Low back pain, unspecified: Secondary | ICD-10-CM | POA: Diagnosis not present

## 2012-05-03 DIAGNOSIS — M48061 Spinal stenosis, lumbar region without neurogenic claudication: Secondary | ICD-10-CM | POA: Diagnosis not present

## 2012-05-03 DIAGNOSIS — IMO0001 Reserved for inherently not codable concepts without codable children: Secondary | ICD-10-CM | POA: Diagnosis not present

## 2012-05-03 DIAGNOSIS — M5106 Intervertebral disc disorders with myelopathy, lumbar region: Secondary | ICD-10-CM | POA: Diagnosis not present

## 2012-05-03 DIAGNOSIS — M4310 Spondylolisthesis, site unspecified: Secondary | ICD-10-CM | POA: Diagnosis not present

## 2012-05-09 DIAGNOSIS — Q762 Congenital spondylolisthesis: Secondary | ICD-10-CM | POA: Diagnosis not present

## 2012-05-09 DIAGNOSIS — M48061 Spinal stenosis, lumbar region without neurogenic claudication: Secondary | ICD-10-CM | POA: Diagnosis not present

## 2012-05-09 DIAGNOSIS — M4120 Other idiopathic scoliosis, site unspecified: Secondary | ICD-10-CM | POA: Diagnosis not present

## 2012-05-11 DIAGNOSIS — M4120 Other idiopathic scoliosis, site unspecified: Secondary | ICD-10-CM | POA: Diagnosis not present

## 2012-05-11 DIAGNOSIS — M48061 Spinal stenosis, lumbar region without neurogenic claudication: Secondary | ICD-10-CM | POA: Diagnosis not present

## 2012-05-11 DIAGNOSIS — Q762 Congenital spondylolisthesis: Secondary | ICD-10-CM | POA: Diagnosis not present

## 2012-05-12 DIAGNOSIS — Q762 Congenital spondylolisthesis: Secondary | ICD-10-CM | POA: Diagnosis not present

## 2012-05-12 DIAGNOSIS — M4120 Other idiopathic scoliosis, site unspecified: Secondary | ICD-10-CM | POA: Diagnosis not present

## 2012-05-12 DIAGNOSIS — M48061 Spinal stenosis, lumbar region without neurogenic claudication: Secondary | ICD-10-CM | POA: Diagnosis not present

## 2012-05-15 DIAGNOSIS — Q762 Congenital spondylolisthesis: Secondary | ICD-10-CM | POA: Diagnosis not present

## 2012-05-15 DIAGNOSIS — M4120 Other idiopathic scoliosis, site unspecified: Secondary | ICD-10-CM | POA: Diagnosis not present

## 2012-05-15 DIAGNOSIS — M48061 Spinal stenosis, lumbar region without neurogenic claudication: Secondary | ICD-10-CM | POA: Diagnosis not present

## 2012-05-17 DIAGNOSIS — Q762 Congenital spondylolisthesis: Secondary | ICD-10-CM | POA: Diagnosis not present

## 2012-05-17 DIAGNOSIS — M48061 Spinal stenosis, lumbar region without neurogenic claudication: Secondary | ICD-10-CM | POA: Diagnosis not present

## 2012-05-17 DIAGNOSIS — M4120 Other idiopathic scoliosis, site unspecified: Secondary | ICD-10-CM | POA: Diagnosis not present

## 2012-05-22 DIAGNOSIS — Q762 Congenital spondylolisthesis: Secondary | ICD-10-CM | POA: Diagnosis not present

## 2012-05-22 DIAGNOSIS — M4120 Other idiopathic scoliosis, site unspecified: Secondary | ICD-10-CM | POA: Diagnosis not present

## 2012-05-22 DIAGNOSIS — M48061 Spinal stenosis, lumbar region without neurogenic claudication: Secondary | ICD-10-CM | POA: Diagnosis not present

## 2012-05-24 DIAGNOSIS — M4120 Other idiopathic scoliosis, site unspecified: Secondary | ICD-10-CM | POA: Diagnosis not present

## 2012-05-24 DIAGNOSIS — Q762 Congenital spondylolisthesis: Secondary | ICD-10-CM | POA: Diagnosis not present

## 2012-05-24 DIAGNOSIS — M48061 Spinal stenosis, lumbar region without neurogenic claudication: Secondary | ICD-10-CM | POA: Diagnosis not present

## 2012-05-26 DIAGNOSIS — M48061 Spinal stenosis, lumbar region without neurogenic claudication: Secondary | ICD-10-CM | POA: Diagnosis not present

## 2012-05-26 DIAGNOSIS — Q762 Congenital spondylolisthesis: Secondary | ICD-10-CM | POA: Diagnosis not present

## 2012-05-26 DIAGNOSIS — M4120 Other idiopathic scoliosis, site unspecified: Secondary | ICD-10-CM | POA: Diagnosis not present

## 2012-05-29 DIAGNOSIS — Q762 Congenital spondylolisthesis: Secondary | ICD-10-CM | POA: Diagnosis not present

## 2012-05-29 DIAGNOSIS — M4120 Other idiopathic scoliosis, site unspecified: Secondary | ICD-10-CM | POA: Diagnosis not present

## 2012-05-29 DIAGNOSIS — M48061 Spinal stenosis, lumbar region without neurogenic claudication: Secondary | ICD-10-CM | POA: Diagnosis not present

## 2012-05-29 DIAGNOSIS — M1710 Unilateral primary osteoarthritis, unspecified knee: Secondary | ICD-10-CM | POA: Diagnosis not present

## 2012-05-31 DIAGNOSIS — M48061 Spinal stenosis, lumbar region without neurogenic claudication: Secondary | ICD-10-CM | POA: Diagnosis not present

## 2012-05-31 DIAGNOSIS — M4120 Other idiopathic scoliosis, site unspecified: Secondary | ICD-10-CM | POA: Diagnosis not present

## 2012-05-31 DIAGNOSIS — Q762 Congenital spondylolisthesis: Secondary | ICD-10-CM | POA: Diagnosis not present

## 2012-06-02 DIAGNOSIS — M4120 Other idiopathic scoliosis, site unspecified: Secondary | ICD-10-CM | POA: Diagnosis not present

## 2012-06-02 DIAGNOSIS — Q762 Congenital spondylolisthesis: Secondary | ICD-10-CM | POA: Diagnosis not present

## 2012-06-02 DIAGNOSIS — M48061 Spinal stenosis, lumbar region without neurogenic claudication: Secondary | ICD-10-CM | POA: Diagnosis not present

## 2012-06-05 DIAGNOSIS — M48061 Spinal stenosis, lumbar region without neurogenic claudication: Secondary | ICD-10-CM | POA: Diagnosis not present

## 2012-06-05 DIAGNOSIS — M4120 Other idiopathic scoliosis, site unspecified: Secondary | ICD-10-CM | POA: Diagnosis not present

## 2012-06-05 DIAGNOSIS — Q762 Congenital spondylolisthesis: Secondary | ICD-10-CM | POA: Diagnosis not present

## 2012-06-06 DIAGNOSIS — IMO0002 Reserved for concepts with insufficient information to code with codable children: Secondary | ICD-10-CM | POA: Diagnosis not present

## 2012-06-06 DIAGNOSIS — M1710 Unilateral primary osteoarthritis, unspecified knee: Secondary | ICD-10-CM | POA: Diagnosis not present

## 2012-06-07 DIAGNOSIS — M48061 Spinal stenosis, lumbar region without neurogenic claudication: Secondary | ICD-10-CM | POA: Diagnosis not present

## 2012-06-07 DIAGNOSIS — M4120 Other idiopathic scoliosis, site unspecified: Secondary | ICD-10-CM | POA: Diagnosis not present

## 2012-06-07 DIAGNOSIS — Q762 Congenital spondylolisthesis: Secondary | ICD-10-CM | POA: Diagnosis not present

## 2012-06-08 DIAGNOSIS — M1710 Unilateral primary osteoarthritis, unspecified knee: Secondary | ICD-10-CM | POA: Diagnosis not present

## 2012-06-09 DIAGNOSIS — M4120 Other idiopathic scoliosis, site unspecified: Secondary | ICD-10-CM | POA: Diagnosis not present

## 2012-06-09 DIAGNOSIS — M48061 Spinal stenosis, lumbar region without neurogenic claudication: Secondary | ICD-10-CM | POA: Diagnosis not present

## 2012-06-09 DIAGNOSIS — Q762 Congenital spondylolisthesis: Secondary | ICD-10-CM | POA: Diagnosis not present

## 2012-06-13 DIAGNOSIS — M4310 Spondylolisthesis, site unspecified: Secondary | ICD-10-CM | POA: Diagnosis not present

## 2012-06-13 DIAGNOSIS — M545 Low back pain, unspecified: Secondary | ICD-10-CM | POA: Diagnosis not present

## 2012-06-13 DIAGNOSIS — M48061 Spinal stenosis, lumbar region without neurogenic claudication: Secondary | ICD-10-CM | POA: Diagnosis not present

## 2012-06-13 DIAGNOSIS — M5106 Intervertebral disc disorders with myelopathy, lumbar region: Secondary | ICD-10-CM | POA: Diagnosis not present

## 2012-06-14 ENCOUNTER — Encounter: Payer: Self-pay | Admitting: Internal Medicine

## 2012-06-14 ENCOUNTER — Encounter: Payer: Self-pay | Admitting: Family Medicine

## 2012-06-15 ENCOUNTER — Encounter: Payer: Self-pay | Admitting: Internal Medicine

## 2012-06-21 DIAGNOSIS — M25819 Other specified joint disorders, unspecified shoulder: Secondary | ICD-10-CM | POA: Diagnosis not present

## 2012-06-26 ENCOUNTER — Other Ambulatory Visit: Payer: Self-pay | Admitting: Orthopedic Surgery

## 2012-06-27 DIAGNOSIS — M1710 Unilateral primary osteoarthritis, unspecified knee: Secondary | ICD-10-CM | POA: Diagnosis not present

## 2012-07-21 NOTE — H&P (Signed)
TOTAL KNEE ADMISSION H&P  Patient is being admitted for right total knee arthroplasty.  Subjective:  Chief Complaint:right knee pain.  HPI: Katherine Mcpherson, 67 y.o. female, has a history of pain and functional disability in the right knee due to arthritis and has failed non-surgical conservative treatments for greater than 12 weeks to includeNSAID's and/or analgesics, corticosteriod injections and activity modification.  Onset of symptoms was gradual, starting >10 years ago with gradually worsening course since that time. The patient noted no past surgery on the right knee(s).  Patient currently rates pain in the right knee(s) at 7 out of 10 with activity. Patient has night pain, worsening of pain with activity and weight bearing and pain that interferes with activities of daily living.  Patient has evidence of subchondral cysts, periarticular osteophytes, joint subluxation and joint space narrowing by imaging studies. There is no active infection.  Patient Active Problem List   Diagnosis Date Noted  . HTN (hypertension) 12/30/2011  . Overweight 12/30/2011  . Vaginal atrophy 12/30/2011  . Menopausal state 12/30/2011  . Low back pain 12/15/2011   Past Medical History  Diagnosis Date  . Hypertension   . Arthritis   . Polyp of colon 10/2009  . Allergy     Past Surgical History  Procedure Laterality Date  . Colonoscopy  08/2007    TUBULAR ADENOMAWITH DYSPLASIA  . Foot surgery  2002  . Knee surgery  2002  . Abdominal hysterectomy      TAH/BSO  . Ankle surgery    . Back surgery- fusion l5-s1  2013    No prescriptions prior to admission   Allergies  Allergen Reactions  . Sulfa Antibiotics Other (See Comments)    Makes tongue feel funny and makes her feel like she will pass  Out.  Last for days.  . Penicillins Itching    Childhood allergy.   . Latex Rash    History  Substance Use Topics  . Smoking status: Never Smoker   . Smokeless tobacco: Never Used  . Alcohol Use: No     Family History  Problem Relation Age of Onset  . Osteoporosis Mother   . Hypertension Sister   . Hypertension Sister   . Cancer Paternal Uncle     PANCREATIC     Review of Systems  Constitutional: Negative.   HENT: Negative.   Eyes: Negative.   Respiratory: Negative.   Cardiovascular: Negative.   Gastrointestinal: Negative.   Genitourinary: Negative.   Musculoskeletal: Positive for joint pain.  Skin: Negative.   Neurological: Negative.   Endo/Heme/Allergies: Bruises/bleeds easily.  Psychiatric/Behavioral: Negative.     Objective:  Physical Exam  Constitutional: She is oriented to person, place, and time. She appears well-developed and well-nourished.  HENT:  Head: Normocephalic.  Eyes: Pupils are equal, round, and reactive to light.  Cardiovascular: Intact distal pulses.   Respiratory: Effort normal.  Musculoskeletal:       Right knee: She exhibits decreased range of motion and deformity. Tenderness found. Lateral joint line tenderness noted.  Neurological: She is alert and oriented to person, place, and time.  Psychiatric: She has a normal mood and affect.    Vital signs in last 24 hours:    Labs:   Estimated body mass index is 32.82 kg/(m^2) as calculated from the following:   Height as of 04/11/12: 5\' 8"  (1.727 m).   Weight as of 04/11/12: 97.886 kg (215 lb 12.8 oz).   Imaging Review Plain radiographs demonstrate severe degenerative joint disease of the  right knee(s). The overall alignment issignificant valgus. The bone quality appears to be adequate for age and reported activity level.  Assessment/Plan:  End stage arthritis, right knee   The patient history, physical examination, clinical judgment of the provider and imaging studies are consistent with end stage degenerative joint disease of the right knee(s) and total knee arthroplasty is deemed medically necessary. The treatment options including medical management, injection therapy arthroscopy and  arthroplasty were discussed at length. The risks and benefits of total knee arthroplasty were presented and reviewed. The risks due to aseptic loosening, infection, stiffness, patella tracking problems, thromboembolic complications and other imponderables were discussed. The patient acknowledged the explanation, agreed to proceed with the plan and consent was signed. Patient is being admitted for inpatient treatment for surgery, pain control, PT, OT, prophylactic antibiotics, VTE prophylaxis, progressive ambulation and ADL's and discharge planning. The patient is planning to be discharged home with home health services

## 2012-07-24 ENCOUNTER — Encounter (HOSPITAL_COMMUNITY)
Admission: RE | Admit: 2012-07-24 | Discharge: 2012-07-24 | Disposition: A | Discharge status: Home / Self Care | Payer: Medicare Other | Source: Ambulatory Visit | Attending: Orthopedic Surgery | Admitting: Orthopedic Surgery

## 2012-07-24 ENCOUNTER — Encounter (HOSPITAL_COMMUNITY): Payer: Self-pay

## 2012-07-24 DIAGNOSIS — K449 Diaphragmatic hernia without obstruction or gangrene: Secondary | ICD-10-CM | POA: Diagnosis not present

## 2012-07-24 DIAGNOSIS — Z01818 Encounter for other preprocedural examination: Secondary | ICD-10-CM | POA: Diagnosis not present

## 2012-07-24 DIAGNOSIS — Z8249 Family history of ischemic heart disease and other diseases of the circulatory system: Secondary | ICD-10-CM | POA: Diagnosis not present

## 2012-07-24 DIAGNOSIS — I10 Essential (primary) hypertension: Secondary | ICD-10-CM | POA: Diagnosis not present

## 2012-07-24 DIAGNOSIS — Z01812 Encounter for preprocedural laboratory examination: Secondary | ICD-10-CM | POA: Diagnosis not present

## 2012-07-24 DIAGNOSIS — Z981 Arthrodesis status: Secondary | ICD-10-CM | POA: Diagnosis not present

## 2012-07-24 DIAGNOSIS — Z880 Allergy status to penicillin: Secondary | ICD-10-CM | POA: Diagnosis not present

## 2012-07-24 DIAGNOSIS — Z7982 Long term (current) use of aspirin: Secondary | ICD-10-CM | POA: Diagnosis not present

## 2012-07-24 DIAGNOSIS — M1710 Unilateral primary osteoarthritis, unspecified knee: Secondary | ICD-10-CM | POA: Diagnosis not present

## 2012-07-24 DIAGNOSIS — Z91040 Latex allergy status: Secondary | ICD-10-CM | POA: Diagnosis not present

## 2012-07-24 DIAGNOSIS — M67919 Unspecified disorder of synovium and tendon, unspecified shoulder: Secondary | ICD-10-CM | POA: Diagnosis not present

## 2012-07-24 DIAGNOSIS — G8918 Other acute postprocedural pain: Secondary | ICD-10-CM | POA: Diagnosis not present

## 2012-07-24 DIAGNOSIS — IMO0002 Reserved for concepts with insufficient information to code with codable children: Secondary | ICD-10-CM | POA: Diagnosis not present

## 2012-07-24 DIAGNOSIS — M719 Bursopathy, unspecified: Secondary | ICD-10-CM | POA: Diagnosis not present

## 2012-07-24 DIAGNOSIS — Z882 Allergy status to sulfonamides status: Secondary | ICD-10-CM | POA: Diagnosis not present

## 2012-07-24 DIAGNOSIS — Z79899 Other long term (current) drug therapy: Secondary | ICD-10-CM | POA: Diagnosis not present

## 2012-07-24 DIAGNOSIS — K219 Gastro-esophageal reflux disease without esophagitis: Secondary | ICD-10-CM | POA: Diagnosis not present

## 2012-07-24 HISTORY — DX: Other specified postprocedural states: Z98.890

## 2012-07-24 HISTORY — DX: Gastro-esophageal reflux disease without esophagitis: K21.9

## 2012-07-24 HISTORY — DX: Nausea with vomiting, unspecified: R11.2

## 2012-07-24 HISTORY — DX: Personal history of other diseases of the digestive system: Z87.19

## 2012-07-24 LAB — CBC WITH DIFFERENTIAL/PLATELET
Basophils Relative: 1 % (ref 0–1)
HCT: 40.3 % (ref 36.0–46.0)
Hemoglobin: 13.4 g/dL (ref 12.0–15.0)
Lymphs Abs: 2.2 10*3/uL (ref 0.7–4.0)
MCH: 29.7 pg (ref 26.0–34.0)
MCHC: 33.3 g/dL (ref 30.0–36.0)
Monocytes Absolute: 0.6 10*3/uL (ref 0.1–1.0)
Monocytes Relative: 7 % (ref 3–12)
Neutro Abs: 5.1 10*3/uL (ref 1.7–7.7)
RBC: 4.51 MIL/uL (ref 3.87–5.11)

## 2012-07-24 LAB — URINALYSIS, ROUTINE W REFLEX MICROSCOPIC
Glucose, UA: NEGATIVE mg/dL
Ketones, ur: NEGATIVE mg/dL
Nitrite: NEGATIVE
Protein, ur: NEGATIVE mg/dL
Urobilinogen, UA: 0.2 mg/dL (ref 0.0–1.0)

## 2012-07-24 LAB — BASIC METABOLIC PANEL
BUN: 17 mg/dL (ref 6–23)
Chloride: 105 mEq/L (ref 96–112)
Glucose, Bld: 95 mg/dL (ref 70–99)
Potassium: 4.4 mEq/L (ref 3.5–5.1)

## 2012-07-24 LAB — URINE MICROSCOPIC-ADD ON

## 2012-07-24 LAB — ABO/RH: ABO/RH(D): A POS

## 2012-07-24 LAB — TYPE AND SCREEN: ABO/RH(D): A POS

## 2012-07-24 LAB — SURGICAL PCR SCREEN
MRSA, PCR: NEGATIVE
Staphylococcus aureus: NEGATIVE

## 2012-07-24 NOTE — Pre-Procedure Instructions (Addendum)
Katherine Mcpherson  07/24/2012   Your procedure is scheduled on:  07/31/12  Report to [facility] at 530 AM.  Call this number if you have problems the morning of surgery: [phone]   Remember:   Do not eat food or drink liquids after midnight.   Take these medicines the morning of surgery with A SIP OF WATER:prilosec, tylenol STOP voltaren, vit e, tumeric on 5/ 14/14   Do not wear jewelry, make-up or nail polish.  Do not wear lotions, powders, or perfumes. You may wear deodorant.  Do not shave 48 hours prior to surgery. Men may shave face and neck.  Do not bring valuables to the hospital.  Contacts, dentures or bridgework may not be worn into surgery.  Leave suitcase in the car. After surgery it may be brought to your room.  For patients admitted to the hospital, checkout time is 11:00 AM the day of  discharge.   Patients discharged the day of surgery will not be allowed to drive  home.  Name and phone number of your driver:   Special Instructions: Incentive Spirometry - Practice and bring it with you on the day of surgery. Shower using CHG 2 nights before surgery and the night before surgery.  If you shower the day of surgery use CHG.  Use special wash - you have one bottle of CHG for all showers.  You should use approximately 1/3 of the bottle for each shower.   Please read over the following fact sheets that you were given: Pain Booklet, Coughing and Deep Breathing, Blood Transfusion Information, Total Joint Packet, MRSA Information and Surgical Site Infection Prevention

## 2012-07-27 DIAGNOSIS — M67919 Unspecified disorder of synovium and tendon, unspecified shoulder: Secondary | ICD-10-CM | POA: Diagnosis not present

## 2012-07-30 MED ORDER — VANCOMYCIN HCL IN DEXTROSE 1-5 GM/200ML-% IV SOLN
1000.0000 mg | INTRAVENOUS | Status: AC
  Administered 2012-07-31: 1000 mg via INTRAVENOUS
  Filled 2012-07-30: qty 200

## 2012-07-31 ENCOUNTER — Inpatient Hospital Stay (HOSPITAL_COMMUNITY): Payer: Medicare Other | Admitting: Anesthesiology

## 2012-07-31 ENCOUNTER — Encounter (HOSPITAL_COMMUNITY): Payer: Self-pay | Admitting: *Deleted

## 2012-07-31 ENCOUNTER — Inpatient Hospital Stay (HOSPITAL_COMMUNITY)
Admission: RE | Admit: 2012-07-31 | Discharge: 2012-08-03 | DRG: 470 | Disposition: A | Discharge status: Home Health | Payer: Medicare Other | Source: Ambulatory Visit | Attending: Orthopedic Surgery | Admitting: Orthopedic Surgery

## 2012-07-31 ENCOUNTER — Encounter (HOSPITAL_COMMUNITY): Payer: Self-pay | Admitting: Anesthesiology

## 2012-07-31 ENCOUNTER — Encounter (HOSPITAL_COMMUNITY)
Admission: RE | Disposition: A | Discharge status: Home Health | Payer: Self-pay | Source: Ambulatory Visit | Attending: Orthopedic Surgery

## 2012-07-31 DIAGNOSIS — M1710 Unilateral primary osteoarthritis, unspecified knee: Principal | ICD-10-CM | POA: Diagnosis present

## 2012-07-31 DIAGNOSIS — Z882 Allergy status to sulfonamides status: Secondary | ICD-10-CM

## 2012-07-31 DIAGNOSIS — M1711 Unilateral primary osteoarthritis, right knee: Secondary | ICD-10-CM

## 2012-07-31 DIAGNOSIS — Z91040 Latex allergy status: Secondary | ICD-10-CM

## 2012-07-31 DIAGNOSIS — Z01812 Encounter for preprocedural laboratory examination: Secondary | ICD-10-CM

## 2012-07-31 DIAGNOSIS — Z981 Arthrodesis status: Secondary | ICD-10-CM

## 2012-07-31 DIAGNOSIS — K219 Gastro-esophageal reflux disease without esophagitis: Secondary | ICD-10-CM | POA: Diagnosis present

## 2012-07-31 DIAGNOSIS — Z8249 Family history of ischemic heart disease and other diseases of the circulatory system: Secondary | ICD-10-CM

## 2012-07-31 DIAGNOSIS — Z880 Allergy status to penicillin: Secondary | ICD-10-CM

## 2012-07-31 DIAGNOSIS — Z7982 Long term (current) use of aspirin: Secondary | ICD-10-CM

## 2012-07-31 DIAGNOSIS — I10 Essential (primary) hypertension: Secondary | ICD-10-CM | POA: Diagnosis present

## 2012-07-31 DIAGNOSIS — Z79899 Other long term (current) drug therapy: Secondary | ICD-10-CM

## 2012-07-31 DIAGNOSIS — K449 Diaphragmatic hernia without obstruction or gangrene: Secondary | ICD-10-CM | POA: Diagnosis present

## 2012-07-31 HISTORY — DX: Pure hypercholesterolemia, unspecified: E78.00

## 2012-07-31 HISTORY — DX: Low back pain: M54.5

## 2012-07-31 HISTORY — DX: Other chronic pain: G89.29

## 2012-07-31 HISTORY — DX: Low back pain, unspecified: M54.50

## 2012-07-31 HISTORY — PX: TOTAL KNEE ARTHROPLASTY: SHX125

## 2012-07-31 SURGERY — ARTHROPLASTY, KNEE, TOTAL
Anesthesia: Regional | Site: Knee | Laterality: Right | Wound class: Clean

## 2012-07-31 MED ORDER — LIDOCAINE HCL (CARDIAC) 20 MG/ML IV SOLN
INTRAVENOUS | Status: DC | PRN
  Administered 2012-07-31: 100 mg via INTRAVENOUS

## 2012-07-31 MED ORDER — ONDANSETRON HCL 4 MG PO TABS: 4.0000 mg | ORAL_TABLET | Freq: Four times a day (QID) | ORAL | Status: DC | PRN

## 2012-07-31 MED ORDER — OXYCODONE HCL 5 MG/5ML PO SOLN: 5.0000 mg | Freq: Once | ORAL | Status: AC | PRN

## 2012-07-31 MED ORDER — ACETAMINOPHEN 650 MG RE SUPP: 650.0000 mg | Freq: Four times a day (QID) | RECTAL | Status: DC | PRN

## 2012-07-31 MED ORDER — ACETAMINOPHEN 325 MG PO TABS: 650.0000 mg | ORAL_TABLET | Freq: Four times a day (QID) | ORAL | Status: DC | PRN

## 2012-07-31 MED ORDER — CEFUROXIME SODIUM 1.5 G IJ SOLR
INTRAMUSCULAR | Status: AC
  Filled 2012-07-31: qty 1.5

## 2012-07-31 MED ORDER — HYDROMORPHONE HCL PF 1 MG/ML IJ SOLN
INTRAMUSCULAR | Status: AC
  Filled 2012-07-31: qty 1

## 2012-07-31 MED ORDER — PANTOPRAZOLE SODIUM 40 MG PO TBEC
40.0000 mg | DELAYED_RELEASE_TABLET | Freq: Every day | ORAL | Status: DC | PRN
  Administered 2012-08-01 – 2012-08-02 (×2): 40 mg via ORAL
  Filled 2012-07-31 (×2): qty 1

## 2012-07-31 MED ORDER — ACETAMINOPHEN 10 MG/ML IV SOLN
1000.0000 mg | Freq: Four times a day (QID) | INTRAVENOUS | Status: AC
  Administered 2012-07-31 – 2012-08-01 (×4): 1000 mg via INTRAVENOUS
  Filled 2012-07-31 (×4): qty 100

## 2012-07-31 MED ORDER — DIPHENHYDRAMINE HCL 12.5 MG/5ML PO ELIX: 12.5000 mg | ORAL_SOLUTION | ORAL | Status: DC | PRN

## 2012-07-31 MED ORDER — METHOCARBAMOL 500 MG PO TABS
500.0000 mg | ORAL_TABLET | Freq: Four times a day (QID) | ORAL | Status: DC | PRN
  Administered 2012-07-31 – 2012-08-03 (×7): 500 mg via ORAL
  Filled 2012-07-31 (×7): qty 1

## 2012-07-31 MED ORDER — ACETAMINOPHEN 10 MG/ML IV SOLN
INTRAVENOUS | Status: AC
  Administered 2012-07-31: 1000 mg via INTRAVENOUS
  Filled 2012-07-31: qty 100

## 2012-07-31 MED ORDER — LORATADINE 10 MG PO TABS
10.0000 mg | ORAL_TABLET | Freq: Every day | ORAL | Status: DC | PRN
  Filled 2012-07-31: qty 1

## 2012-07-31 MED ORDER — ALUM & MAG HYDROXIDE-SIMETH 200-200-20 MG/5ML PO SUSP
30.0000 mL | ORAL | Status: DC | PRN
  Administered 2012-08-01 – 2012-08-03 (×4): 30 mL via ORAL
  Filled 2012-07-31 (×4): qty 30

## 2012-07-31 MED ORDER — LISINOPRIL 20 MG PO TABS
20.0000 mg | ORAL_TABLET | Freq: Every day | ORAL | Status: DC
  Administered 2012-07-31 – 2012-08-02 (×3): 20 mg via ORAL
  Filled 2012-07-31 (×5): qty 1

## 2012-07-31 MED ORDER — CHLORHEXIDINE GLUCONATE 4 % EX LIQD: 60.0000 mL | Freq: Once | CUTANEOUS | Status: DC

## 2012-07-31 MED ORDER — SCOPOLAMINE 1 MG/3DAYS TD PT72
MEDICATED_PATCH | TRANSDERMAL | Status: AC
  Filled 2012-07-31: qty 1

## 2012-07-31 MED ORDER — ARTIFICIAL TEARS OP OINT
TOPICAL_OINTMENT | OPHTHALMIC | Status: DC | PRN
  Administered 2012-07-31: 1 via OPHTHALMIC

## 2012-07-31 MED ORDER — MAGNESIUM HYDROXIDE 400 MG/5ML PO SUSP: 30.0000 mL | Freq: Every day | ORAL | Status: DC | PRN

## 2012-07-31 MED ORDER — DEXTROSE-NACL 5-0.45 % IV SOLN: INTRAVENOUS | Status: DC

## 2012-07-31 MED ORDER — BISACODYL 5 MG PO TBEC: 5.0000 mg | DELAYED_RELEASE_TABLET | Freq: Every day | ORAL | Status: DC | PRN

## 2012-07-31 MED ORDER — MENTHOL 3 MG MT LOZG: 1.0000 | LOZENGE | OROMUCOSAL | Status: DC | PRN

## 2012-07-31 MED ORDER — METOCLOPRAMIDE HCL 5 MG/ML IJ SOLN: 5.0000 mg | Freq: Three times a day (TID) | INTRAMUSCULAR | Status: DC | PRN

## 2012-07-31 MED ORDER — DEXAMETHASONE SODIUM PHOSPHATE 4 MG/ML IJ SOLN
INTRAMUSCULAR | Status: DC | PRN
  Administered 2012-07-31: 8 mg via INTRAVENOUS

## 2012-07-31 MED ORDER — OXYCODONE HCL 5 MG PO TABS
ORAL_TABLET | ORAL | Status: AC
  Filled 2012-07-31: qty 1

## 2012-07-31 MED ORDER — ONDANSETRON HCL 4 MG/2ML IJ SOLN: 4.0000 mg | Freq: Four times a day (QID) | INTRAMUSCULAR | Status: DC | PRN

## 2012-07-31 MED ORDER — CARISOPRODOL 350 MG PO TABS: 350.0000 mg | ORAL_TABLET | Freq: Three times a day (TID) | ORAL | Status: DC | PRN

## 2012-07-31 MED ORDER — BUPIVACAINE-EPINEPHRINE PF 0.5-1:200000 % IJ SOLN
INTRAMUSCULAR | Status: DC | PRN
  Administered 2012-07-31: 30 mL

## 2012-07-31 MED ORDER — ONDANSETRON HCL 4 MG/2ML IJ SOLN: 4.0000 mg | Freq: Once | INTRAMUSCULAR | Status: DC | PRN

## 2012-07-31 MED ORDER — FLEET ENEMA 7-19 GM/118ML RE ENEM: 1.0000 | ENEMA | Freq: Once | RECTAL | Status: AC | PRN

## 2012-07-31 MED ORDER — MEPERIDINE HCL 25 MG/ML IJ SOLN: 6.2500 mg | INTRAMUSCULAR | Status: DC | PRN

## 2012-07-31 MED ORDER — PHENOL 1.4 % MT LIQD: 1.0000 | OROMUCOSAL | Status: DC | PRN

## 2012-07-31 MED ORDER — SODIUM CHLORIDE 0.9 % IR SOLN
Status: DC | PRN
  Administered 2012-07-31: 3000 mL

## 2012-07-31 MED ORDER — PHENYLEPHRINE HCL 10 MG/ML IJ SOLN
INTRAMUSCULAR | Status: DC | PRN
  Administered 2012-07-31 (×3): 40 ug via INTRAVENOUS

## 2012-07-31 MED ORDER — ASPIRIN EC 325 MG PO TBEC
325.0000 mg | DELAYED_RELEASE_TABLET | Freq: Two times a day (BID) | ORAL | Status: DC
  Administered 2012-07-31 – 2012-08-03 (×6): 325 mg via ORAL
  Filled 2012-07-31 (×7): qty 1

## 2012-07-31 MED ORDER — LACTATED RINGERS IV SOLN
INTRAVENOUS | Status: DC | PRN
  Administered 2012-07-31 (×3): via INTRAVENOUS

## 2012-07-31 MED ORDER — MIDAZOLAM HCL 5 MG/5ML IJ SOLN
INTRAMUSCULAR | Status: DC | PRN
  Administered 2012-07-31: 2 mg via INTRAVENOUS

## 2012-07-31 MED ORDER — OXYCODONE HCL 5 MG PO TABS
5.0000 mg | ORAL_TABLET | Freq: Once | ORAL | Status: AC | PRN
  Administered 2012-07-31: 5 mg via ORAL

## 2012-07-31 MED ORDER — OMEPRAZOLE MAGNESIUM 20 MG PO TBEC: 20.0000 mg | DELAYED_RELEASE_TABLET | Freq: Every day | ORAL | Status: DC | PRN

## 2012-07-31 MED ORDER — HYDROMORPHONE HCL PF 1 MG/ML IJ SOLN
INTRAMUSCULAR | Status: DC | PRN
  Administered 2012-07-31 (×2): 0.5 mg via INTRAVENOUS

## 2012-07-31 MED ORDER — CEFUROXIME SODIUM 1.5 G IJ SOLR
INTRAMUSCULAR | Status: DC | PRN
  Administered 2012-07-31: 1.5 g

## 2012-07-31 MED ORDER — HYDROMORPHONE HCL PF 1 MG/ML IJ SOLN
1.0000 mg | INTRAMUSCULAR | Status: DC | PRN
  Administered 2012-07-31 – 2012-08-02 (×5): 1 mg via INTRAVENOUS
  Filled 2012-07-31 (×5): qty 1

## 2012-07-31 MED ORDER — OXYCODONE HCL 5 MG PO TABS
5.0000 mg | ORAL_TABLET | ORAL | Status: DC | PRN
  Administered 2012-07-31 – 2012-08-03 (×14): 10 mg via ORAL
  Filled 2012-07-31 (×14): qty 2

## 2012-07-31 MED ORDER — PROPOFOL 10 MG/ML IV BOLUS
INTRAVENOUS | Status: DC | PRN
  Administered 2012-07-31: 300 mg via INTRAVENOUS

## 2012-07-31 MED ORDER — METOCLOPRAMIDE HCL 10 MG PO TABS: 5.0000 mg | ORAL_TABLET | Freq: Three times a day (TID) | ORAL | Status: DC | PRN

## 2012-07-31 MED ORDER — KCL IN DEXTROSE-NACL 20-5-0.45 MEQ/L-%-% IV SOLN
INTRAVENOUS | Status: DC
  Administered 2012-07-31 – 2012-08-01 (×2): via INTRAVENOUS
  Filled 2012-07-31 (×11): qty 1000

## 2012-07-31 MED ORDER — VITAMIN E 180 MG (400 UNIT) PO CAPS
400.0000 [IU] | ORAL_CAPSULE | Freq: Every day | ORAL | Status: DC
  Administered 2012-08-03: 400 [IU] via ORAL
  Filled 2012-07-31 (×4): qty 1

## 2012-07-31 MED ORDER — METHOCARBAMOL 100 MG/ML IJ SOLN
500.0000 mg | Freq: Four times a day (QID) | INTRAVENOUS | Status: DC | PRN
  Filled 2012-07-31: qty 5

## 2012-07-31 MED ORDER — VITAMIN D3 25 MCG (1000 UNIT) PO TABS
2000.0000 [IU] | ORAL_TABLET | Freq: Every day | ORAL | Status: DC
  Administered 2012-07-31 – 2012-08-03 (×4): 2000 [IU] via ORAL
  Filled 2012-07-31 (×4): qty 2

## 2012-07-31 MED ORDER — SODIUM CHLORIDE 0.9 % IR SOLN
Status: DC | PRN
  Administered 2012-07-31: 1

## 2012-07-31 MED ORDER — SCOPOLAMINE 1 MG/3DAYS TD PT72
MEDICATED_PATCH | TRANSDERMAL | Status: DC | PRN
  Administered 2012-07-31: 1 via TRANSDERMAL

## 2012-07-31 MED ORDER — VITAMIN D 50 MCG (2000 UT) PO CAPS
2000.0000 [IU] | ORAL_CAPSULE | Freq: Every day | ORAL | Status: DC
Start: 2012-07-31 — End: 2012-07-31

## 2012-07-31 MED ORDER — FENTANYL CITRATE 0.05 MG/ML IJ SOLN
INTRAMUSCULAR | Status: DC | PRN
  Administered 2012-07-31 (×5): 50 ug via INTRAVENOUS

## 2012-07-31 MED ORDER — HYDROMORPHONE HCL PF 1 MG/ML IJ SOLN
0.5000 mg | Freq: Once | INTRAMUSCULAR | Status: AC
  Administered 2012-07-31: 0.5 mg via INTRAVENOUS

## 2012-07-31 MED ORDER — ONDANSETRON HCL 4 MG/2ML IJ SOLN
INTRAMUSCULAR | Status: DC | PRN
  Administered 2012-07-31 (×2): 4 mg via INTRAVENOUS

## 2012-07-31 MED ORDER — ZOLPIDEM TARTRATE 5 MG PO TABS: 5.0000 mg | ORAL_TABLET | Freq: Every evening | ORAL | Status: DC | PRN

## 2012-07-31 MED ORDER — HYDROMORPHONE HCL PF 1 MG/ML IJ SOLN
0.2500 mg | INTRAMUSCULAR | Status: DC | PRN
  Administered 2012-07-31 (×4): 0.5 mg via INTRAVENOUS

## 2012-07-31 SURGICAL SUPPLY — 60 items
BANDAGE ESMARK 6X9 LF (GAUZE/BANDAGES/DRESSINGS) ×1 IMPLANT
BLADE SAG 18X100X1.27 (BLADE) ×2 IMPLANT
BLADE SAW SGTL 13X75X1.27 (BLADE) ×2 IMPLANT
BLADE SURG ROTATE 9660 (MISCELLANEOUS) IMPLANT
BNDG CMPR 9X6 STRL LF SNTH (GAUZE/BANDAGES/DRESSINGS) ×1
BNDG CMPR MED 10X6 ELC LF (GAUZE/BANDAGES/DRESSINGS) ×1
BNDG ELASTIC 6X10 VLCR STRL LF (GAUZE/BANDAGES/DRESSINGS) ×2 IMPLANT
BNDG ESMARK 6X9 LF (GAUZE/BANDAGES/DRESSINGS) ×2
BOWL SMART MIX CTS (DISPOSABLE) ×2 IMPLANT
CEMENT HV SMART SET (Cement) ×4 IMPLANT
CLOTH BEACON ORANGE TIMEOUT ST (SAFETY) ×2 IMPLANT
COVER SURGICAL LIGHT HANDLE (MISCELLANEOUS) ×2 IMPLANT
CUFF TOURNIQUET SINGLE 34IN LL (TOURNIQUET CUFF) ×1 IMPLANT
CUFF TOURNIQUET SINGLE 44IN (TOURNIQUET CUFF) IMPLANT
DRAPE EXTREMITY T 121X128X90 (DRAPE) ×2 IMPLANT
DRAPE U-SHAPE 47X51 STRL (DRAPES) ×2 IMPLANT
DURAPREP 26ML APPLICATOR (WOUND CARE) ×2 IMPLANT
ELECT REM PT RETURN 9FT ADLT (ELECTROSURGICAL) ×2
ELECTRODE REM PT RTRN 9FT ADLT (ELECTROSURGICAL) ×1 IMPLANT
EVACUATOR 1/8 PVC DRAIN (DRAIN) ×2 IMPLANT
FACESHIELD LNG OPTICON STERILE (SAFETY) ×1 IMPLANT
GAUZE XEROFORM 1X8 LF (GAUZE/BANDAGES/DRESSINGS) ×2 IMPLANT
GLOVE BIO SURGEON STRL SZ7 (GLOVE) ×1 IMPLANT
GLOVE BIO SURGEON STRL SZ7.5 (GLOVE) ×1 IMPLANT
GLOVE BIOGEL PI IND STRL 6.5 (GLOVE) IMPLANT
GLOVE BIOGEL PI IND STRL 7.0 (GLOVE) ×1 IMPLANT
GLOVE BIOGEL PI IND STRL 8 (GLOVE) ×1 IMPLANT
GLOVE BIOGEL PI INDICATOR 6.5 (GLOVE) ×1
GLOVE BIOGEL PI INDICATOR 7.0 (GLOVE) ×1
GLOVE BIOGEL PI INDICATOR 8 (GLOVE) ×1
GLOVE SURG SS PI 6.5 STRL IVOR (GLOVE) ×1 IMPLANT
GLOVE SURG SS PI 7.0 STRL IVOR (GLOVE) ×3 IMPLANT
GLOVE SURG SS PI 7.5 STRL IVOR (GLOVE) ×1 IMPLANT
GOWN PREVENTION PLUS XLARGE (GOWN DISPOSABLE) ×3 IMPLANT
GOWN STRL NON-REIN LRG LVL3 (GOWN DISPOSABLE) ×4 IMPLANT
HANDPIECE INTERPULSE COAX TIP (DISPOSABLE) ×2
HOOD PEEL AWAY FACE SHEILD DIS (HOOD) ×5 IMPLANT
KIT BASIN OR (CUSTOM PROCEDURE TRAY) ×2 IMPLANT
KIT ROOM TURNOVER OR (KITS) ×2 IMPLANT
MANIFOLD NEPTUNE II (INSTRUMENTS) ×2 IMPLANT
NS IRRIG 1000ML POUR BTL (IV SOLUTION) ×2 IMPLANT
PACK TOTAL JOINT (CUSTOM PROCEDURE TRAY) ×2 IMPLANT
PAD ARMBOARD 7.5X6 YLW CONV (MISCELLANEOUS) ×4 IMPLANT
PADDING CAST COTTON 6X4 STRL (CAST SUPPLIES) ×2 IMPLANT
SET HNDPC FAN SPRY TIP SCT (DISPOSABLE) ×1 IMPLANT
SPONGE GAUZE 4X4 12PLY (GAUZE/BANDAGES/DRESSINGS) ×4 IMPLANT
STAPLER VISISTAT 35W (STAPLE) ×2 IMPLANT
SUCTION FRAZIER TIP 10 FR DISP (SUCTIONS) ×1 IMPLANT
SUT VIC AB 0 CTX 36 (SUTURE) ×2
SUT VIC AB 0 CTX36XBRD ANTBCTR (SUTURE) ×1 IMPLANT
SUT VIC AB 1 CTX 36 (SUTURE) ×2
SUT VIC AB 1 CTX36XBRD ANBCTR (SUTURE) ×1 IMPLANT
SUT VIC AB 2-0 CT1 27 (SUTURE) ×2
SUT VIC AB 2-0 CT1 TAPERPNT 27 (SUTURE) ×1 IMPLANT
TAPE HY-TAPE 1X5Y PINK NS LF (GAUZE/BANDAGES/DRESSINGS) ×1 IMPLANT
TOWEL OR 17X24 6PK STRL BLUE (TOWEL DISPOSABLE) ×2 IMPLANT
TOWEL OR 17X26 10 PK STRL BLUE (TOWEL DISPOSABLE) ×2 IMPLANT
TRAY FOLEY BAG SILVER LF 14FR (CATHETERS) ×1 IMPLANT
TRAY FOLEY CATH 14FR (SET/KITS/TRAYS/PACK) IMPLANT
WATER STERILE IRR 1000ML POUR (IV SOLUTION) ×4 IMPLANT

## 2012-07-31 NOTE — Progress Notes (Signed)
PT is recommending home with HH and not SNF. CSW will make CM aware. Clinical Social Worker will sign off for now as social work intervention is no longer needed. Please consult us again if new need arises.   Leonard Feigel, MSW [phone] 

## 2012-07-31 NOTE — Anesthesia Postprocedure Evaluation (Signed)
Anesthesia Post Note  Patient: Katherine Mcpherson  Procedure(s) Performed: Procedure(s) (LRB): TOTAL KNEE ARTHROPLASTY (Right)  Anesthesia type: general  Patient location: PACU  Post pain: Pain level controlled  Post assessment: Patient's Cardiovascular Status Stable  Last Vitals:  Filed Vitals:   07/31/12 1150  BP:   Pulse: 79  Temp: 36.4 C  Resp: 18    Post vital signs: Reviewed and stable  Level of consciousness: sedated  Complications: No apparent anesthesia complications

## 2012-07-31 NOTE — Evaluation (Signed)
Physical Therapy Evaluation Patient Details Name: Katherine Mcpherson MRN: [id number] DOB: August 20, 1945 Today's Date: 07/31/2012 Time: 4696-2952 PT Time Calculation (min): 23 min  PT Assessment / Plan / Recommendation Clinical Impression  Patient is a 67 yo female s/p Rt. TKA.  Will benefit from acute PT to maximize independence prior to return home with assist of sister.  Recommend HHPT for f/u PT.    PT Assessment  Patient needs continued PT services    Follow Up Recommendations  Home health PT;Supervision/Assistance - 24 hour    Does the patient have the potential to tolerate intense rehabilitation      Barriers to Discharge None      Equipment Recommendations  None recommended by PT    Recommendations for Other Services     Frequency 7X/week    Precautions / Restrictions Precautions Precautions: Knee Precaution Booklet Issued: Yes (comment) Precaution Comments: Provided education on precautions to patient. Restrictions Weight Bearing Restrictions: Yes RLE Weight Bearing: Weight bearing as tolerated   Pertinent Vitals/Pain       Mobility  Bed Mobility Bed Mobility: Supine to Sit;Sitting - Scoot to Edge of Bed Supine to Sit: 4: Min assist;HOB flat Sitting - Scoot to [place] of Bed: 4: Min assist Details for Bed Mobility Assistance: Verbal cues for technique.  Assist to move RLE off of bed. Transfers Transfers: Sit to Stand;Stand to Sit Sit to Stand: 3: Mod assist;From elevated surface;With upper extremity assist;From bed Stand to Sit: 4: Min assist;With upper extremity assist;With armrests;To chair/3-in-1 Stand Pivot Transfers: 3: Mod assist Details for Transfer Assistance: Verbal cues for hand placement and technique.  Required assist and elevated surface to rise to standing.  Able to take several steps to pivot to chair. Ambulation/Gait Ambulation/Gait Assistance: Not tested (comment)    Exercises Total Joint Exercises Ankle Circles/Pumps: AROM;Both;10 reps;Seated    PT Diagnosis: Difficulty walking;Acute pain  PT Problem List: Decreased strength;Decreased range of motion;Decreased activity tolerance;Decreased balance;Decreased mobility;Decreased knowledge of use of DME;Decreased knowledge of precautions;Pain PT Treatment Interventions: DME instruction;Gait training;Stair training;Functional mobility training;Therapeutic exercise;Patient/family education   PT Goals Acute Rehab PT Goals PT Goal Formulation: With patient Time For Goal Achievement: 08/07/12 Potential to Achieve Goals: Good Pt will go Supine/Side to Sit: with supervision;with HOB 0 degrees PT Goal: Supine/Side to Sit - Progress: Goal set today Pt will go Sit to Supine/Side: with supervision;with HOB 0 degrees PT Goal: Sit to Supine/Side - Progress: Goal set today Pt will go Sit to Stand: with supervision;with upper extremity assist PT Goal: Sit to Stand - Progress: Goal set today Pt will Ambulate: 51 - 150 feet;with supervision;with rolling walker PT Goal: Ambulate - Progress: Goal set today Pt will Go Up / Down Stairs: 3-5 stairs;with supervision;with rail(s);with least restrictive assistive device PT Goal: Up/Down Stairs - Progress: Goal set today Pt will Perform Home Exercise Program: with supervision, verbal cues required/provided PT Goal: Perform Home Exercise Program - Progress: Goal set today  Visit Information  Last PT Received On: 07/31/12 Assistance Needed: +1    Subjective Data  Subjective: "I'm doing OK" Patient Stated Goal: To go home soon   Prior Functioning  Home Living Lives With: Alone Available Help at Discharge: Family;Available 24 hours/day (Sister staying with patient) Type of Home: House Home Access: Stairs to enter [employer] of Steps: 4 Entrance Stairs-Rails: Right;Left (Posts) Home Layout: One level Bathroom Shower/Tub: Engineer, manufacturing systems: Standard Bathroom Accessibility: Yes How Accessible: Accessible via walker Home  Adaptive Equipment: Bedside commode/3-in-1;Shower chair with back;Quad  cane;Walker - rolling;Straight cane Prior Function Level of Independence: Independent Able to Take Stairs?: Yes Driving: Yes Vocation: Retired Musician: No difficulties    Copywriter, advertising Arousal/Alertness: Awake/alert Behavior During Therapy: WFL for tasks assessed/performed Overall Cognitive Status: Within Functional Limits for tasks assessed    Extremity/Trunk Assessment Right Upper Extremity Assessment RUE ROM/Strength/Tone: WFL for tasks assessed (Reports tendonitis) RUE Sensation: WFL - Light Touch Left Upper Extremity Assessment LUE ROM/Strength/Tone: WFL for tasks assessed LUE Sensation: WFL - Light Touch Right Lower Extremity Assessment RLE ROM/Strength/Tone: Deficits;Unable to fully assess;Due to pain RLE ROM/Strength/Tone Deficits: Able to assist with moving RLE off of bed Left Lower Extremity Assessment LLE ROM/Strength/Tone: WFL for tasks assessed LLE Sensation: WFL - Light Touch Trunk Assessment Trunk Assessment: Normal   Balance Balance Balance Assessed: Yes Static Sitting Balance Static Sitting - Balance Support: No upper extremity supported;Feet supported Static Sitting - Level of Assistance: 5: Stand by assistance Static Sitting - Comment/# of Minutes: 6 minutes with good balance  End of Session PT - End of Session Equipment Utilized During Treatment: Gait belt;Oxygen Activity Tolerance: Patient limited by pain;Patient limited by fatigue Patient left: in chair;with call bell/phone within reach;with family/visitor present Nurse Communication: Mobility status;Patient requests pain meds CPM Right Knee CPM Right Knee: Off (CPM off at 13:25)   GP     Vena Austria 07/31/2012, 1:53 PM  Durenda Hurt. Renaldo Fiddler, [facility] Acute Rehab Services Pager [phone]

## 2012-07-31 NOTE — Anesthesia Preprocedure Evaluation (Addendum)
Anesthesia Evaluation  Patient identified by MRN, date of birth, ID band Patient awake    Reviewed: Allergy & Precautions, H&P , NPO status , Patient's Chart, lab work & pertinent test results  History of Anesthesia Complications (+) PONV  Airway Mallampati: II TM Distance: >3 FB Neck ROM: Full    Dental  (+) Teeth Intact and Dental Advisory Given   Pulmonary          Cardiovascular hypertension, Pt. on medications Rhythm:Irregular     Neuro/Psych    GI/Hepatic hiatal hernia, GERD-  Medicated and Controlled,  Endo/Other    Renal/GU      Musculoskeletal   Abdominal (+) + obese,   Peds  Hematology   Anesthesia Other Findings   Reproductive/Obstetrics                           Anesthesia Physical Anesthesia Plan  ASA: II  Anesthesia Plan: General   Post-op Pain Management:    Induction: Intravenous  Airway Management Planned: LMA  Additional Equipment:   Intra-op Plan:   Post-operative Plan: Extubation in OR  Informed Consent: I have reviewed the patients History and Physical, chart, labs and discussed the procedure including the risks, benefits and alternatives for the proposed anesthesia with the patient or authorized representative who has indicated his/her understanding and acceptance.   Dental advisory given  Plan Discussed with: CRNA and Surgeon  Anesthesia Plan Comments: (H/O PONV will add Scop Patch)       Anesthesia Quick Evaluation

## 2012-07-31 NOTE — Op Note (Signed)
PATIENT ID:      Katherine Mcpherson  MRN:     [id number] DOB/AGE:    69/18/1947 / 69 y.o.       OPERATIVE REPORT    DATE OF PROCEDURE:  07/31/2012       PREOPERATIVE DIAGNOSIS:   OSTEOARTHRITIS RIGHT KNEE VALGUS      Estimated body mass index is 32.82 kg/(m^2) as calculated from the following:   Height as of 07/24/12: 5\' 8"  (1.727 m).   Weight as of 04/11/12: 97.886 kg (215 lb 12.8 oz).                                                        POSTOPERATIVE DIAGNOSIS:   OSTEOARTHRITIS RIGHT KNEE VALGUS                                                                      PROCEDURE:  Procedure(s): TOTAL KNEE ARTHROPLASTY Using Depuy Sigma RP implants #4L Femur, #4Tibia, 10mm sigma RP bearing, 38 Patella     SURGEON: Carleta Woodrow Mcpherson    ASSISTANT:   Shirl Harris PA-C   (Present and scrubbed throughout the case, critical for assistance with exposure, retraction, instrumentation, and closure.)         ANESTHESIA: GET left femoral nerve block  DRAINS: foley, 2 medium hemovac in knee   TOURNIQUET TIME:   COMPLICATIONS:  None     SPECIMENS: None   INDICATIONS FOR PROCEDURE: The patient has  OSTEOARTHRITIS RIGHT KNEE VALGUS, varus deformities, XR shows bone on bone arthritis. Patient has failed all conservative measures including anti-inflammatory medicines, narcotics, attempts at  exercise and weight loss, cortisone injections and viscosupplementation.  Risks and benefits of surgery have been discussed, questions answered.   DESCRIPTION OF PROCEDURE: The patient identified by armband, received  IV antibiotics, in the holding area at [facility] Patient taken to the operating room, appropriate anesthetic  monitors were attached, and general endotracheal anesthesia induced with  the patient in supine position, Foley catheter was inserted. Tourniquet  applied high to the operative thigh. Lateral post and foot positioner  applied to the table, the lower extremity was then prepped and  draped  in usual sterile fashion from the ankle to the tourniquet. Time-out procedure was performed. The limb was wrapped with an Esmarch bandage and the tourniquet inflated to 350 mmHg. We began the operation by making the anterior midline incision starting at handbreadth above the patella going over the patella 1 cm medial to and  4 cm distal to the tibial tubercle. Small bleeders in the skin and the  subcutaneous tissue identified and cauterized. Transverse retinaculum was incised and reflected medially and a medial parapatellar arthrotomy was accomplished. the patella was everted and theprepatellar fat pad resected. The superficial medial collateral  ligament was then elevated from anterior to posterior along the proximal  flare of the tibia and anterior half of the menisci resected. The knee was hyperflexed exposing bone on bone arthritis. Peripheral and notch osteophytes as well as the cruciate ligaments were then resected. We continued to  work our way around posteriorly along the proximal tibia, and externally  rotated the tibia subluxing it out from underneath the femur. A McHale  retractor was placed through the notch and a lateral Hohmann retractor  placed, and we then drilled through the proximal tibia in line with the  axis of the tibia followed by an intramedullary guide rod and 2-degree  posterior slope cutting guide. The tibial cutting guide was pinned into place  allowing resection of 8 mm of bone medially and about 4 mm of bone  laterally because of her varus deformity. Satisfied with the tibial resection, we then  entered the distal femur 2 mm anterior to the PCL origin with the  intramedullary guide rod and applied the distal femoral cutting guide  set at 11mm, with 6 degrees of valgus. This was pinned along the  epicondylar axis. At this point, the distal femoral cut was accomplished without difficulty. We then sized for a #4L femoral component and pinned the guide in 0 degrees  of external rotation.The chamfer cutting guide was pinned into place. The anterior, posterior, and chamfer cuts were accomplished without difficulty followed by  the Sigma RP box cutting guide and the box cut. We also removed posterior osteophytes from the posterior femoral condyles. At this  time, the knee was brought into full extension. We checked our  extension and flexion gaps and found them symmetric at 10mm.  The patella thickness measured at 24 mm. We set the cutting guide at 15 and removed the posterior 10 mm  of the patella, sized for a 38 button and drilled the lollipop. The knee  was then once again hyperflexed exposing the proximal tibia. We sized for a #4 tibial base plate, applied the smokestack and the conical reamer followed by the the Delta fin keel punch. We then hammered into place the Sigma RP trial femoral component, inserted a 10-mm trial bearing, trial patellar button, and took the knee through range of motion from 0-130 degrees. No thumb pressure was required for patellar  tracking. At this point, all trial components were removed, a double batch of DePuy HV cement with 1500 mg of Zinacef was mixed and applied to all bony metallic mating surfaces except for the posterior condyles of the femur itself. In order, we  hammered into place the tibial tray and removed excess cement, the femoral component and removed excess cement, a 10-mm Sigma RP bearing  was inserted, and the knee brought to full extension with compression.  The patellar button was clamped into place, and excess cement  removed. While the cement cured the wound was irrigated out with normal saline solution pulse lavage, and medium Hemovac drains were placed from an anterolateral  approach. Ligament stability and patellar tracking were checked and found to be excellent. The parapatellar arthrotomy was closed with  running #1 Vicryl suture. The subcutaneous tissue with 0 and 2-0 undyed  Vicryl suture, and the skin  with skin staples. A dressing of Xeroform,  4 x 4, dressing sponges, Webril, and Ace wrap applied. The patient  awakened, extubated, and taken to recovery room without difficulty.   Katherine Mcpherson 07/31/2012, 9:00 AM

## 2012-07-31 NOTE — Progress Notes (Signed)
Orthopedic Tech Progress Note Patient Details:  Katherine Mcpherson 09-09-1945 [id number] Applied CPM to RLE, left Footsie Roll with pt's nurse. CPM Right Knee CPM Right Knee: On Right Knee Flexion (Degrees): 60 Right Knee Extension (Degrees): 0   Lesle Chris 07/31/2012, 11:22 AM

## 2012-07-31 NOTE — Interval H&P Note (Signed)
History and Physical Interval Note:  07/31/2012 7:12 AM  Katherine Mcpherson  has presented today for surgery, with the diagnosis of OSTEOARTHRITIS RIGHT KNEE VALGUS  The various methods of treatment have been discussed with the patient and family. After consideration of risks, benefits and other options for treatment, the patient has consented to  Procedure(s) with comments: TOTAL KNEE ARTHROPLASTY (Right) - DEPUY/SIGMA as a surgical intervention .  The patient's history has been reviewed, patient examined, no change in status, stable for surgery.  I have reviewed the patient's chart and labs.  Questions were answered to the patient's satisfaction.     [name]

## 2012-07-31 NOTE — Interval H&P Note (Signed)
History and Physical Interval Note:  07/31/2012 7:09 AM  Katherine Mcpherson  has presented today for surgery, with the diagnosis of OSTEOARTHRITIS RIGHT KNEE VALGUS  The various methods of treatment have been discussed with the patient and family. After consideration of risks, benefits and other options for treatment, the patient has consented to  Procedure(s) with comments: TOTAL KNEE ARTHROPLASTY (Right) - DEPUY/SIGMA as a surgical intervention .  The patient's history has been reviewed, patient examined, no change in status, stable for surgery.  I have reviewed the patient's chart and labs.  Questions were answered to the patient's satisfaction.     [name]

## 2012-07-31 NOTE — Progress Notes (Signed)
UR COMPLETED  

## 2012-07-31 NOTE — Transfer of Care (Signed)
Immediate Anesthesia Transfer of Care Note  Patient: Katherine Mcpherson  Procedure(s) Performed: Procedure(s) with comments: TOTAL KNEE ARTHROPLASTY (Right) - DEPUY/SIGMA  Patient Location: PACU  Anesthesia Type:General  Level of Consciousness: awake, alert  and oriented  Airway & Oxygen Therapy: Patient Spontanous Breathing and Patient connected to nasal cannula oxygen  Post-op Assessment: Report given to PACU RN, Post -op Vital signs reviewed and stable, Patient moving all extremities and Patient able to stick tongue midline  Post vital signs: Reviewed and stable  Complications: No apparent anesthesia complications

## 2012-07-31 NOTE — Anesthesia Procedure Notes (Addendum)
Anesthesia Regional Block:  Femoral nerve block  Pre-Anesthetic Checklist: ,, timeout performed, Correct Patient, Correct Site, Correct Laterality, Correct Procedure, Correct Position, site marked, Risks and benefits discussed,  Surgical consent,  Pre-op evaluation,  At surgeon's request and post-op pain management  Laterality: Right  Prep: chloraprep       Needles:  Injection technique: Single-shot  Needle Type: Echogenic Stimulator Needle     Needle Length: 10cm 10 cm     Additional Needles:  Procedures: ultrasound guided (picture in chart) and nerve stimulator Femoral nerve block  Nerve Stimulator or Paresthesia:  Response: 0.4 mA,   Additional Responses:   Narrative:  Start time: 07/31/2012 7:05 AM End time: 07/31/2012 7:20 AM Injection made incrementally with aspirations every 5 mL.  Performed by: Personally  Anesthesiologist: Arta Bruce MD  Additional Notes: Monitors applied. Patient sedated. Sterile prep and drape,hand hygiene and sterile gloves were used. Relevant anatomy identified.Needle position confirmed.Local anesthetic injected incrementally after negative aspiration. Local anesthetic spread visualized around nerve(s). Vascular puncture avoided. No complications. Image printed for medical record.The patient tolerated the procedure well.       Femoral nerve block Procedure Name: LMA Insertion Date/Time: 07/31/2012 7:43 AM Performed by: Elizbeth Squires R Pre-anesthesia Checklist: Patient identified, Emergency Drugs available, Suction available and Patient being monitored Patient Re-evaluated:Patient Re-evaluated prior to inductionOxygen Delivery Method: Circle system utilized Preoxygenation: Pre-oxygenation with 100% oxygen Intubation Type: IV induction LMA: LMA inserted LMA Size: 4.0 and 5.0 Number of attempts: 3 Placement Confirmation: positive ETCO2 and breath sounds checked- equal and bilateral Tube secured with: Tape Dental Injury: Teeth and  Oropharynx as per pre-operative assessment  Comments: LMA 4 inserted x 1 by CRNA with leak around cuff.  Attempted to reseat with continued leak. 5 LMA placed.  +/- BS and + ETCO2

## 2012-08-01 ENCOUNTER — Encounter (HOSPITAL_COMMUNITY): Payer: Self-pay | Admitting: Orthopedic Surgery

## 2012-08-01 LAB — CBC
HCT: 30.4 % — ABNORMAL LOW (ref 36.0–46.0)
Hemoglobin: 10 g/dL — ABNORMAL LOW (ref 12.0–15.0)
MCV: 90.2 fL (ref 78.0–100.0)
RBC: 3.37 MIL/uL — ABNORMAL LOW (ref 3.87–5.11)
WBC: 10 10*3/uL (ref 4.0–10.5)

## 2012-08-01 LAB — BASIC METABOLIC PANEL
BUN: 10 mg/dL (ref 6–23)
CO2: 28 mEq/L (ref 19–32)
Chloride: 103 mEq/L (ref 96–112)
Creatinine, Ser: 0.78 mg/dL (ref 0.50–1.10)
GFR calc Af Amer: >90 mL/min (ref >90)
Glucose, Bld: 219 mg/dL — ABNORMAL HIGH (ref 70–99)
Potassium: 5.5 mEq/L — ABNORMAL HIGH (ref 3.5–5.1)

## 2012-08-01 NOTE — Progress Notes (Signed)
Patient ID: Katherine Mcpherson, female   DOB: 12/30/1945, 67 y.o.   MRN: [id number] PATIENT ID: Katherine Mcpherson  MRN: [id number]  DOB/AGE:  1945-10-16 / 67 y.o.  1 Day Post-Op Procedure(s) (LRB): TOTAL KNEE ARTHROPLASTY (Right)    PROGRESS NOTE Subjective: Patient is alert, oriented, no Nausea, no Vomiting, yes passing gas, no Bowel Movement. Taking PO well. Denies SOB, Chest or Calf Pain. Using Incentive Spirometer, PAS in place. Ambulate in room weight bearing as tolerated yesterday, CPM 0-60 Patient reports pain as 4 on 0-10 scale  .    Objective: Vital signs in last 24 hours: Filed Vitals:   07/31/12 1153 07/31/12 2100 08/01/12 0226 08/01/12 0500  BP: 143/60 118/50 119/66 102/40  Pulse: 79 64 64 67  Temp: 97.6 F (36.4 C) 98.3 F (36.8 C) 98.8 F (37.1 C) 97.1 F (36.2 C)  TempSrc:  Oral Oral Oral  Resp: 18 18 18 18   SpO2: 100% 100% 100% 98%      Intake/Output from previous day: I/O last 3 completed shifts: In: 2200 [I.V.:2200] Out: 3225 [Urine:2350; Drains:675; Blood:200]   Intake/Output this shift:     LABORATORY DATA:  Recent Labs  08/01/12 0430  WBC 10.0  HGB 10.0*  HCT 30.4*  PLT 274  NA 136  K 5.5*  CL 103  CO2 28  BUN 10  CREATININE 0.78  GLUCOSE 219*  CALCIUM 8.7    Examination: Neurologically intact ABD soft Neurovascular intact Sensation intact distally Intact pulses distally Dorsiflexion/Plantar flexion intact Incision: scant drainage No cellulitis present Compartment soft}  Assessment:   1 Day Post-Op Procedure(s) (LRB): TOTAL KNEE ARTHROPLASTY (Right) ADDITIONAL DIAGNOSIS:  Hypertension  Plan: PT/OT WBAT, CPM 5/hrs day until ROM 0-90 degrees, then D/C CPM DVT Prophylaxis:  SCDx72hrs, ASA 325 mg BID x 2 weeks DISCHARGE PLAN: Home, anticipate discharge tomorrow after passing PT DISCHARGE NEEDS: HHPT, HHRN, CPM, Walker and 3-in-1 comode seat     Euell Schiff J 08/01/2012, 7:06 AM

## 2012-08-01 NOTE — Progress Notes (Signed)
Occupational Therapy Evaluation Patient Details Name: Katherine Mcpherson MRN: [id number] DOB: 03/10/1946 Today's Date: 08/01/2012 Time: 2440-1027 OT Time Calculation (min): 19 min  OT Assessment / Plan / Recommendation Clinical Impression  Pt is s/p R TKR, recent back surgery (6 months ago), presents to OT with decreased ADL independence and safety. Will benefit from skilled OT to maximize independence prior to d/c home.    OT Assessment  Patient needs continued OT Services    Follow Up Recommendations  No OT follow up;Supervision/Assistance - 24 hour    Equipment Recommendations  None recommended by OT;Other (comment) (pt has all needed equipment)    Frequency  Min 2X/week    Precautions / Restrictions Precautions Precautions: Knee Restrictions Weight Bearing Restrictions: Yes RLE Weight Bearing: Weight bearing as tolerated   Pertinent Vitals/Pain 6/10 R knee    ADL  Eating/Feeding: Performed;Independent Where Assessed - Eating/Feeding: Bed level Grooming: Performed;Wash/dry hands;Min guard Where Assessed - Grooming: Supported standing Upper Body Bathing: Simulated;Set up Where Assessed - Upper Body Bathing: Supported standing Lower Body Bathing: Simulated;Minimal assistance Where Assessed - Lower Body Bathing: Supported sitting Upper Body Dressing: Simulated;Set up Where Assessed - Upper Body Dressing: Supported sitting Lower Body Dressing: Simulated;Moderate assistance Where Assessed - Lower Body Dressing: Supported sitting Toilet Transfer: Performed;Min guard Statistician Method: Sit to Barista: Comfort height toilet;Grab bars Toileting - Clothing Manipulation and Hygiene: Performed;Min guard Where Assessed - Engineer, mining and Hygiene: Sit to stand from 3-in-1 or toilet Transfers/Ambulation Related to ADLs: Patient c/o back pain with mobility. She demonstrates good technique with RW ambulating to/from bathroom and safe  hand placement during transfers. ADL Comments: PAtient reports family/friends to assist as needed with ADLs.    OT Diagnosis: Generalized weakness  OT Problem List: Decreased strength;Decreased knowledge of use of DME or AE OT Treatment Interventions: Self-care/ADL training;DME and/or AE instruction;Therapeutic activities;Patient/family education   OT Goals Acute Rehab OT Goals OT Goal Formulation: With patient Time For Goal Achievement: 08/15/12 Potential to Achieve Goals: Good ADL Goals Pt Will Perform Lower Body Bathing: Sit to stand from chair;Sit to stand from bed;with supervision ADL Goal: Lower Body Bathing - Progress: Goal set today Pt Will Perform Lower Body Dressing: with min assist;Sit to stand from chair;Sit to stand from bed ADL Goal: Lower Body Dressing - Progress: Goal set today Pt Will Transfer to Toilet: with supervision;with DME;3-in-1 ADL Goal: Toilet Transfer - Progress: Goal set today Pt Will Perform Toileting - Clothing Manipulation: with supervision ADL Goal: Toileting - Clothing Manipulation - Progress: Goal set today Pt Will Perform Toileting - Hygiene: with supervision ADL Goal: Toileting - Hygiene - Progress: Goal set today Pt Will Perform Tub/Shower Transfer: Tub transfer;Shower transfer;with min assist;with DME;Shower seat without back ADL Goal: Web designer - Progress: Goal set today  Visit Information  Last OT Received On: 08/01/12 Assistance Needed: +1    Subjective Data      Prior Functioning     Home Living Lives With: Alone Available Help at Discharge: Family;Available 24 hours/day Type of Home: House Home Access: Stairs to enter [employer] of Steps: 4 Entrance Stairs-Rails: Right;Left Home Layout: One level Bathroom Shower/Tub: Engineer, manufacturing systems: Standard Bathroom Accessibility: Yes How Accessible: Accessible via walker Home Adaptive Equipment: Bedside commode/3-in-1;Shower chair with back;Quad  cane;Walker - rolling;Straight cane Prior Function Level of Independence: Independent Able to Take Stairs?: Yes Driving: Yes Vocation: Retired Musician: No difficulties Dominant Hand: Right         Cognition  Cognition Arousal/Alertness: Awake/alert Behavior During Therapy: WFL for tasks assessed/performed Overall Cognitive Status: Within Functional Limits for tasks assessed    Extremity/Trunk Assessment Right Upper Extremity Assessment RUE ROM/Strength/Tone: WFL for tasks assessed Left Upper Extremity Assessment LUE ROM/Strength/Tone: WFL for tasks assessed     Mobility Bed Mobility Supine to Sit: 4: Min guard Sitting - Scoot to Edge of Bed: 4: Min guard Details for Bed Mobility Assistance: Verbal cues for technique.  Patient able to move her RLE off of bed without assistance Transfers Sit to Stand: From elevated surface;With upper extremity assist;From bed;4: Min assist;From chair/3-in-1 Stand to Sit: 4: Min assist;With upper extremity assist;With armrests;To chair/3-in-1 Details for Transfer Assistance: Patient stood x2 with assistance to initate stand and to ensure balance. Cues for safe hand placement and to control sit into recliner     Exercise Total Joint Exercises Ankle Circles/Pumps: AROM;Both;10 reps;Seated Quad Sets: AROM;Right;10 reps Heel Slides: AAROM;Right;10 reps Hip ABduction/ADduction: AAROM;Right;10 reps Straight Leg Raises: AAROM;Right;10 reps   End of Session OT - End of Session Equipment Utilized During Treatment: Gait belt Activity Tolerance: Patient tolerated treatment well;Patient limited by pain Patient left: in chair;with call bell/phone within reach  GO     Katherine Mcpherson A 08/01/2012, 2:42 PM

## 2012-08-01 NOTE — Progress Notes (Signed)
Physical Therapy Treatment Patient Details Name: Katherine Mcpherson MRN: [id number] DOB: [birth year] Today's Date: 08/01/2012 Time: 0940-1007 PT Time Calculation (min): 27 min  PT Assessment / Plan / Recommendation Comments on Treatment Session  Patient s/p R TKA. Able to tolerate small ambulation this morning prior to feeling dizzy. RN made aware and patient returned to sitting. Will attempt increased ambulation this afternoon    Follow Up Recommendations  Home health PT;Supervision/Assistance - 24 hour     Does the patient have the potential to tolerate intense rehabilitation     Barriers to Discharge        Equipment Recommendations  None recommended by PT    Recommendations for Other Services    Frequency 7X/week   Plan Discharge plan remains appropriate;Frequency remains appropriate    Precautions / Restrictions Precautions Precautions: Knee Restrictions Weight Bearing Restrictions: Yes RLE Weight Bearing: Weight bearing as tolerated   Pertinent Vitals/Pain 7/10 knee pain. Premedicated. Patient with complaints of dizziness as well.     Mobility  Bed Mobility Supine to Sit: 4: Min guard Sitting - Scoot to Edge of Bed: 4: Min guard Details for Bed Mobility Assistance: Verbal cues for technique.  Patient able to move her RLE off of bed without assistance Transfers Sit to Stand: From elevated surface;With upper extremity assist;From bed;4: Min assist;From chair/3-in-1 Stand to Sit: 4: Min assist;With upper extremity assist;With armrests;To chair/3-in-1 Details for Transfer Assistance: Patient stood x2 with assistance to initate stand and to ensure balance. Cues for safe hand placement and to control sit into recliner Ambulation/Gait Ambulation/Gait Assistance: 4: Min guard Ambulation Distance (Feet): 20 Feet Assistive device: Rolling walker Ambulation/Gait Assistance Details: Patient able to ambulate about 15 ft and felt dizzy so took a rest break and ambulated 5 more ft  before needing to sit again due to dizziness and tight feeling in her back. Cues for gait sequence and posture Gait Pattern: Step-to pattern;Trunk flexed Gait velocity: decreased    Exercises Total Joint Exercises Ankle Circles/Pumps: AROM;Both;10 reps;Seated Quad Sets: AROM;Right;10 reps Heel Slides: AAROM;Right;10 reps Hip ABduction/ADduction: AAROM;Right;10 reps Straight Leg Raises: AAROM;Right;10 reps   PT Diagnosis:    PT Problem List:   PT Treatment Interventions:     PT Goals Acute Rehab PT Goals PT Goal: Supine/Side to Sit - Progress: Progressing toward goal PT Goal: Sit to Stand - Progress: Progressing toward goal PT Goal: Ambulate - Progress: Progressing toward goal PT Goal: Perform Home Exercise Program - Progress: Progressing toward goal  Visit Information  Last PT Received On: 08/01/12 Assistance Needed: +1    Subjective Data      Cognition  Cognition Arousal/Alertness: Awake/alert Behavior During Therapy: WFL for tasks assessed/performed Overall Cognitive Status: Within Functional Limits for tasks assessed    Balance     End of Session PT - End of Session Equipment Utilized During Treatment: Gait belt Activity Tolerance: Patient limited by fatigue;Patient tolerated treatment well Patient left: in chair;with call bell/phone within reach Nurse Communication: Mobility status CPM Right Knee CPM Right Knee: Off Right Knee Flexion (Degrees): 40 Right Knee Extension (Degrees): 0   GP     Fredrich Birks 08/01/2012, 11:49 AM  08/01/2012 Fredrich Birks PTA [phone] pager [phone] office

## 2012-08-01 NOTE — Progress Notes (Signed)
Physical Therapy Treatment Patient Details Name: Katherine Mcpherson MRN: [id number] DOB: 26-Mar-1945 Today's Date: 08/01/2012 Time: 4098-1191 PT Time Calculation (min): 18 min  PT Assessment / Plan / Recommendation Comments on Treatment Session  pt slowly progressing. no complaints of dizziniess this afternoon. requires max encouragement to participates; demo increased anxiety with activity. Pt hopes to D/C home. WIll cont to f/u with pt to ensure safest D/C plan possibl.e    Follow Up Recommendations  Home health PT;Supervision/Assistance - 24 hour     Does the patient have the potential to tolerate intense rehabilitation     Barriers to Discharge        Equipment Recommendations  None recommended by PT    Recommendations for Other Services    Frequency 7X/week   Plan Discharge plan remains appropriate;Frequency remains appropriate    Precautions / Restrictions Precautions Precautions: Knee Restrictions Weight Bearing Restrictions: Yes RLE Weight Bearing: Weight bearing as tolerated   Pertinent Vitals/Pain 8/10; pt in CPM states it is tolerable. Pt premedicated.     Mobility  Bed Mobility Bed Mobility: Sit to Supine Supine to Sit: 4: Min guard Sitting - Scoot to Edge of Bed: 4: Min guard Sit to Supine: 4: Min assist;HOB flat Details for Bed Mobility Assistance: assistance to advance R LE onto bed ; verbal cues for hand placement and sequencing  Transfers Transfers: Sit to Stand;Stand to Sit Sit to Stand: 4: Min assist;From elevated surface;With upper extremity assist;From chair/3-in-1 Stand to Sit: 4: Min assist;To bed;With upper extremity assist Details for Transfer Assistance: requires assistance to initiate sit to stand; requires assistance to steady; requires verbal cues to stand upright  Ambulation/Gait Ambulation/Gait Assistance: 4: Min guard Ambulation Distance (Feet): 40 Feet Assistive device: Rolling walker Ambulation/Gait Assistance Details: No c/o dizziness  this afternoon. requires max encouragemnt to increase amb. vc's for gt sequencing and to increase step length on Left. amb with fwd flex posture; when fatigued demo decreased safety awareness by resting on RW  Gait Pattern: Step-to pattern;Trunk flexed Gait velocity: decreased Stairs: No Wheelchair Mobility Wheelchair Mobility: No    Exercises Total Joint Exercises Ankle Circles/Pumps: AROM;Both;10 reps;Supine Quad Sets: AROM;Right;10 reps Heel Slides: AAROM;Right;10 reps Hip ABduction/ADduction: AAROM;Right;10 reps Straight Leg Raises: AROM;Right;5 reps Long Arc Quad: AAROM;Right;10 reps;Seated Goniometric ROM: -2 to 50 degrees AROM in sitting   PT Diagnosis:    PT Problem List:   PT Treatment Interventions:     PT Goals Acute Rehab PT Goals PT Goal Formulation: With patient Time For Goal Achievement: 08/07/12 Potential to Achieve Goals: Good PT Goal: Supine/Side to Sit - Progress: Progressing toward goal PT Goal: Sit to Supine/Side - Progress: Progressing toward goal PT Goal: Sit to Stand - Progress: Progressing toward goal PT Goal: Ambulate - Progress: Progressing toward goal PT Goal: Perform Home Exercise Program - Progress: Progressing toward goal  Visit Information  Last PT Received On: 08/01/12 Assistance Needed: +1    Subjective Data  Subjective: "i really just want to get back in bed"  Patient Stated Goal: to get in bed   Cognition  Cognition Arousal/Alertness: Awake/alert Behavior During Therapy: WFL for tasks assessed/performed Overall Cognitive Status: Within Functional Limits for tasks assessed    Balance  Balance Balance Assessed: No  End of Session PT - End of Session Equipment Utilized During Treatment: Gait belt Activity Tolerance: Patient limited by fatigue;Patient limited by pain Patient left: in bed;with call bell/phone within reach;in CPM;Other (comment) (CPM set -1 to 50; tolerable ) Nurse Communication: Mobility status  GP     Shelva Majestic  [address], [place] [phone] 08/01/2012, 3:28 PM

## 2012-08-02 LAB — CBC
HCT: 28.2 % — ABNORMAL LOW (ref 36.0–46.0)
Hemoglobin: 9.4 g/dL — ABNORMAL LOW (ref 12.0–15.0)
MCH: 29.8 pg (ref 26.0–34.0)
MCHC: 33.3 g/dL (ref 30.0–36.0)
MCV: 89.5 fL (ref 78.0–100.0)

## 2012-08-02 LAB — BASIC METABOLIC PANEL
BUN: 10 mg/dL (ref 6–23)
Creatinine, Ser: 0.66 mg/dL (ref 0.50–1.10)
GFR calc non Af Amer: 89 mL/min — ABNORMAL LOW (ref >90)
Glucose, Bld: 113 mg/dL — ABNORMAL HIGH (ref 70–99)
Potassium: 4.1 mEq/L (ref 3.5–5.1)

## 2012-08-02 MED ORDER — ASPIRIN 325 MG PO TBEC: 325.0000 mg | DELAYED_RELEASE_TABLET | Freq: Two times a day (BID) | ORAL | Status: DC

## 2012-08-02 MED ORDER — OXYCODONE-ACETAMINOPHEN 5-325 MG PO TABS: 1.0000 | ORAL_TABLET | ORAL | Status: DC | PRN

## 2012-08-02 NOTE — Progress Notes (Signed)
PATIENT ID: Katherine Mcpherson  MRN: [id number]  DOB/AGE:  69-Jul-1947 / 69 y.o.  2 Days Post-Op Procedure(s) (LRB): TOTAL KNEE ARTHROPLASTY (Right)    PROGRESS NOTE Subjective: Patient is alert, oriented, no Nausea, no Vomiting, yes passing gas, no Bowel Movement. Taking PO well. Denies SOB, Chest or Calf Pain. Using Incentive Spirometer, PAS in place. Ambulating slowly with PT. Patient reports pain as moderate  .    Objective: Vital signs in last 24 hours: Filed Vitals:   08/02/12 0000 08/02/12 0100 08/02/12 0400 08/02/12 0700  BP:  142/68  146/75  Pulse:  85  82  Temp:  98.3 F (36.8 C)  98.5 F (36.9 C)  TempSrc:      Resp: 18 20 18 20   SpO2:  99%  98%      Intake/Output from previous day: I/O last 3 completed shifts: In: 900 [P.O.:900] Out: 1625 [Urine:1450; Drains:175]   Intake/Output this shift:     LABORATORY DATA:  Recent Labs  08/01/12 0430 08/02/12 0610  WBC 10.0 8.7  HGB 10.0* 9.4*  HCT 30.4* 28.2*  PLT 274 231  NA 136 138  K 5.5* 4.1  CL 103 104  CO2 28 26  BUN 10 10  CREATININE 0.78 0.66  GLUCOSE 219* 113*  CALCIUM 8.7 8.5    Examination: Neurologically intact ABD soft Neurovascular intact Sensation intact distally Intact pulses distally Dorsiflexion/Plantar flexion intact Incision: dressing C/D/I}  Assessment:   2 Days Post-Op Procedure(s) (LRB): TOTAL KNEE ARTHROPLASTY (Right) ADDITIONAL DIAGNOSIS:  none  Plan: PT/OT WBAT, CPM 5/hrs day until ROM 0-90 degrees, then D/C CPM DVT Prophylaxis:  SCDx72hrs, ASA 325 mg BID x 2 weeks DISCHARGE PLAN: Home today if PT goals met. DISCHARGE NEEDS: HHPT, HHRN, CPM, Walker and 3-in-1 comode seat     Katherine Mcpherson M. 08/02/2012, 7:57 AM

## 2012-08-02 NOTE — Progress Notes (Signed)
Physical Therapy Treatment Patient Details Name: Katherine Mcpherson MRN: [id number] DOB: January 21, 1946 Today's Date: 08/02/2012 Time: 0802-0826 PT Time Calculation (min): 24 min  PT Assessment / Plan / Recommendation Comments on Treatment Session  Patient continues to make slow progress with ambulation but requires max encouragement to participate. Patient more than likely will not be ready for DC today but hopefully tomorrow    Follow Up Recommendations  Home health PT;Supervision/Assistance - 24 hour     Does the patient have the potential to tolerate intense rehabilitation     Barriers to Discharge        Equipment Recommendations  None recommended by PT    Recommendations for Other Services    Frequency 7X/week   Plan Discharge plan remains appropriate;Frequency remains appropriate    Precautions / Restrictions Precautions Precautions: Knee Restrictions RLE Weight Bearing: Weight bearing as tolerated   Pertinent Vitals/Pain     Mobility  Bed Mobility Supine to Sit: 5: Supervision Sitting - Scoot to Edge of Bed: 5: Supervision Transfers Sit to Stand: From elevated surface;With upper extremity assist;From chair/3-in-1;4: Min guard;From bed Stand to Sit: 4: Min assist;With upper extremity assist;To chair/3-in-1 Details for Transfer Assistance: A to control descent onto recliner. Cues for safe technique to turn all the way around prior to sitting Ambulation/Gait Ambulation/Gait Assistance: 4: Min guard Ambulation Distance (Feet): 60 Feet Assistive device: Rolling walker Ambulation/Gait Assistance Details: Patient taking several standing breaks and leaning over walker due to complaints of back spasm. Encouragement required to increase ambulation.  Gait Pattern: Step-to pattern;Trunk flexed Gait velocity: decreased    Exercises Total Joint Exercises Quad Sets: AROM;Right;10 reps Heel Slides: AAROM;Right;10 reps Hip ABduction/ADduction: AAROM;Right;10 reps Straight Leg  Raises: AROM;Right;10 reps   PT Diagnosis:    PT Problem List:   PT Treatment Interventions:     PT Goals Acute Rehab PT Goals PT Goal: Supine/Side to Sit - Progress: Met PT Goal: Sit to Stand - Progress: Progressing toward goal PT Goal: Ambulate - Progress: Progressing toward goal PT Goal: Perform Home Exercise Program - Progress: Progressing toward goal  Visit Information  Last PT Received On: 08/02/12 Assistance Needed: +1    Subjective Data      Cognition  Cognition Arousal/Alertness: Awake/alert Behavior During Therapy: WFL for tasks assessed/performed Overall Cognitive Status: Within Functional Limits for tasks assessed    Balance     End of Session PT - End of Session Equipment Utilized During Treatment: Gait belt Activity Tolerance: Patient limited by fatigue;Patient limited by pain Patient left: in bed;with call bell/phone within reach;Other (comment) Nurse Communication: Mobility status   GP     Fredrich Birks 08/02/2012, 11:41 AM 08/02/2012 Fredrich Birks PTA [phone] pager [phone] office

## 2012-08-02 NOTE — Progress Notes (Signed)
Occupational Therapy Treatment Patient Details Name: Katherine Mcpherson MRN: [id number] DOB: Jul 27, 1945 Today's Date: 08/02/2012 Time: 4098-1191 OT Time Calculation (min): 40 min  OT Assessment / Plan / Recommendation Comments on Treatment Session Pt progressing towards goals. Practiced tub transfer and pt had difficulty for sit to stand from shower seat...recommended 3 in 1 for this. Pt at Minguard level for LB ADLs.     Follow Up Recommendations  No OT follow up;Supervision/Assistance - 24 hour    Barriers to Discharge       Equipment Recommendations  3 in 1 bedside comode    Recommendations for Other Services    Frequency Min 2X/week   Plan Discharge plan remains appropriate    Precautions / Restrictions Precautions Precautions: Knee Precaution Comments: Provided education on precautions to patient. Restrictions Weight Bearing Restrictions: Yes RLE Weight Bearing: Weight bearing as tolerated   Pertinent Vitals/Pain Pain 7/10 in back and 6/10 in knee. Nurse brought pain meds.     ADL  Grooming: Performed;Wash/dry hands;Min guard Where Assessed - Grooming: Supported standing Lower Body Dressing: Performed;Min guard Where Assessed - Lower Body Dressing: Supported sit to Pharmacist, hospital: Performed;Minimal assistance;Min guard (Minguard-sit to stand Min A-stand to sit) Statistician Method: Sit to Barista: Comfort height toilet;Grab bars (heavily relied on grab bars) Toileting - Clothing Manipulation and Hygiene: Performed;Min guard;Supervision/safety (minguard-clothing and supervision-hygiene) Where Assessed - Toileting Clothing Manipulation and Hygiene: Sit on 3-in-1 or toilet;Sit to stand from 3-in-1 or toilet (hygiene-sitting and clothing-sit to stand) Tub/Shower Transfer: Performed;Moderate assistance Tub/Shower Transfer Method: Science writer: Shower seat with back Equipment Used: Gait belt;Rolling  walker Transfers/Ambulation Related to ADLs: Pt at minguard level for ambulation. Min A for sit to stand from chair and Mod A from shower chair without armrests. Pt at minguard level from 3 in 1.  ADL Comments: Pt able to don/doff socks while sitting in chair. Also, donned underwear with supported sit to stand transfer at Minguard assist. Pt had difficulty standing from shower seat during tub transfer due to it not having armrests requiring Mod A to stand. OT then practiced sit to stand from 3 in 1 and pt at minguard level. Recommended pt to use 3 in 1 for tub transfer.     OT Diagnosis:    OT Problem List:   OT Treatment Interventions:     OT Goals Acute Rehab OT Goals OT Goal Formulation: With patient Time For Goal Achievement: 08/15/12 Potential to Achieve Goals: Good ADL Goals Pt Will Perform Lower Body Bathing: Sit to stand from chair;Sit to stand from bed;with supervision Pt Will Perform Lower Body Dressing: with min assist;Sit to stand from chair;Sit to stand from bed ADL Goal: Lower Body Dressing - Progress: Met Pt Will Transfer to Toilet: with supervision;with DME;3-in-1 ADL Goal: Toilet Transfer - Progress: Progressing toward goals Pt Will Perform Toileting - Clothing Manipulation: with supervision ADL Goal: Toileting - Clothing Manipulation - Progress: Progressing toward goals Pt Will Perform Toileting - Hygiene: with supervision ADL Goal: Toileting - Hygiene - Progress: Met Pt Will Perform Tub/Shower Transfer: Tub transfer;Shower transfer;with min assist;with DME;Shower seat without back ADL Goal: Web designer - Progress: Progressing toward goals  Visit Information  Last OT Received On: 08/02/12 Assistance Needed: +1    Subjective Data      Prior Functioning       Cognition  Cognition Arousal/Alertness: Awake/alert Behavior During Therapy: WFL for tasks assessed/performed Overall Cognitive Status: Within Functional Limits for tasks assessed  Mobility   Bed Mobility Bed Mobility: Sit to Supine Sit to Supine: 4: Min assist Details for Bed Mobility Assistance: assistance to move RLE on to bed.  Transfers Transfers: Sit to Stand;Stand to Sit Sit to Stand: 4: Min guard;4: Min assist;3: Mod assist;From chair/3-in-1 Stand to Sit: 4: Min assist;To chair/3-in-1;To toilet Details for Transfer Assistance: cues for hand placement and positioning of RLE when sitting. Pt had difficulty for sit to stand from shower chair without armrests requiring Mod A. Pt requiring Min A from recliner chair and Minguard from raised 3 in 1.  Min A to control decent when sitting. Recommended getting 3 in 1 to use in tub.       Balance     End of Session OT - End of Session Equipment Utilized During Treatment: Gait belt Activity Tolerance: Patient tolerated treatment well Patient left: in bed;with call bell/phone within reach Nurse Communication: Patient requests pain meds  GO     Earlie Raveling OTR/L [phone] 08/02/2012, 3:49 PM

## 2012-08-02 NOTE — Care Management Note (Signed)
    Page 1 of 1   08/02/2012     11:40:30 AM   CARE MANAGEMENT NOTE 08/02/2012  Patient:  Katherine Mcpherson, Katherine Mcpherson   Account Number:  [id number]  Date Initiated:  08/02/2012  Documentation initiated by:  Elmer Bales  Subjective/Objective Assessment:   Pt admitted with right total knee.     Action/Plan:   Anticiapte discharge home with home health PT   Anticipated DC Date:  08/02/2012   Anticipated DC Plan:  HOME W HOME HEALTH SERVICES      DC Planning Services  CM consult      Choice offered to / List presented to:  C-1 Patient        HH arranged  HH-2 PT      [facility] agency  [facility]   Status of service:  Completed, signed off Medicare Important Message given?   (If response is "NO", the following Medicare IM given date fields will be blank) Date Medicare IM given:   Date Additional Medicare IM given:    Discharge Disposition:  HOME W HOME HEALTH SERVICES  Per UR Regulation:    If discussed at Long Length of Stay Meetings, dates discussed:    Comments:  Pt declined ordered DME.  States she already has equipment at home.

## 2012-08-02 NOTE — Progress Notes (Signed)
Physical Therapy Treatment Patient Details Name: Katherine Mcpherson MRN: [id number] DOB: 1945-06-10 Today's Date: 08/02/2012 Time: 4098-1191 PT Time Calculation (min): 23 min  PT Assessment / Plan / Recommendation Comments on Treatment Session  Patient making progress with ambulation this afternoon. will practice steps in AM    Follow Up Recommendations  Home health PT;Supervision/Assistance - 24 hour     Does the patient have the potential to tolerate intense rehabilitation     Barriers to Discharge        Equipment Recommendations  None recommended by PT    Recommendations for Other Services    Frequency 7X/week   Plan Discharge plan remains appropriate;Frequency remains appropriate    Precautions / Restrictions Precautions Precautions: Knee Precaution Comments: Provided education on precautions to patient. Restrictions RLE Weight Bearing: Weight bearing as tolerated   Pertinent Vitals/Pain     Mobility  Bed Mobility Bed Mobility: Not assessed Supine to Sit: 5: Supervision Sitting - Scoot to Edge of Bed: 5: Supervision Transfers Sit to Stand: From elevated surface;With upper extremity assist;From chair/3-in-1;4: Min guard;From bed Stand to Sit: With upper extremity assist;To chair/3-in-1;4: Min guard Details for Transfer Assistance: Cues for technique. Requires increased time Ambulation/Gait Ambulation/Gait Assistance: 4: Min guard Ambulation Distance (Feet): 100 Feet Assistive device: Rolling walker Ambulation/Gait Assistance Details: Patient with better quality of gait this session. ABle to keep erect posture and increase ambulation Gait Pattern: Step-to pattern Gait velocity: decreased    Exercises Total Joint Exercises Quad Sets: AROM;Right;10 reps Heel Slides: AAROM;Right;10 reps Hip ABduction/ADduction: AAROM;Right;10 reps Straight Leg Raises: AROM;Right;10 reps   PT Diagnosis:    PT Problem List:   PT Treatment Interventions:     PT Goals Acute  Rehab PT Goals PT Goal: Supine/Side to Sit - Progress: Met PT Goal: Sit to Stand - Progress: Progressing toward goal PT Goal: Ambulate - Progress: Progressing toward goal PT Goal: Perform Home Exercise Program - Progress: Progressing toward goal  Visit Information  Last PT Received On: 08/02/12 Assistance Needed: +1    Subjective Data      Cognition  Cognition Arousal/Alertness: Awake/alert Behavior During Therapy: WFL for tasks assessed/performed Overall Cognitive Status: Within Functional Limits for tasks assessed    Balance     End of Session PT - End of Session Equipment Utilized During Treatment: Gait belt Activity Tolerance: Patient tolerated treatment well Patient left: in bed;with call bell/phone within reach;Other (comment) Nurse Communication: Mobility status   GP     Fredrich Birks 08/02/2012, 2:50 PM 08/02/2012 Fredrich Birks PTA [phone] pager [phone] office

## 2012-08-02 NOTE — Discharge Summary (Signed)
Patient ID: Katherine Mcpherson MRN: [id number] DOB/AGE: 04/13/1945 67 y.o.  Admit date: 07/31/2012 Discharge date: 08/02/2012  Admission Diagnoses:  Principal Problem:   Osteoarthritis of right knee   Discharge Diagnoses:  Same  Past Medical History  Diagnosis Date  . Hypertension   . Arthritis   . Polyp of colon 10/2009  . Allergy   . PONV (postoperative nausea and vomiting)   . GERD (gastroesophageal reflux disease)   . H/O hiatal hernia     Surgeries: Procedure(s): TOTAL KNEE ARTHROPLASTY on 07/31/2012   Consultants:    Discharged Condition: Improved  Hospital Course: Katherine Mcpherson is an 67 y.o. female who was admitted 07/31/2012 for operative treatment ofOsteoarthritis of right knee. Patient has severe unremitting pain that affects sleep, daily activities, and work/hobbies. After pre-op clearance the patient was taken to the operating room on 07/31/2012 and underwent  Procedure(s): TOTAL KNEE ARTHROPLASTY.    Patient was given perioperative antibiotics: Anti-infectives   Start     Dose/Rate Route Frequency Ordered Stop   07/31/12 0831  cefUROXime (ZINACEF) injection  Status:  Discontinued       As needed 07/31/12 0831 07/31/12 0917   07/31/12 0600  vancomycin (VANCOCIN) IVPB 1000 mg/200 mL premix     1,000 mg 200 mL/hr over 60 Minutes Intravenous On call to O.R. 07/30/12 1318 07/31/12 0736       Patient was given sequential compression devices, early ambulation, and chemoprophylaxis to prevent DVT.  Patient benefited maximally from hospital stay and there were no complications.    Recent vital signs: Patient Vitals for the past 24 hrs:  BP Temp Temp src Pulse Resp SpO2  08/02/12 0700 146/75 mmHg 98.5 F (36.9 C) - 82 20 98 %  08/02/12 0400 - - - - 18 -  08/02/12 0100 142/68 mmHg 98.3 F (36.8 C) - 85 20 99 %  08/02/12 0000 - - - - 18 -  08/01/12 2224 147/52 mmHg 98.4 F (36.9 C) - 88 20 97 %  08/01/12 2000 - - - - 18 -  08/01/12 1400 128/53 mmHg 99.4 F  (37.4 C) Oral 65 20 100 %     Recent laboratory studies:  Recent Labs  08/01/12 0430 08/02/12 0610  WBC 10.0 8.7  HGB 10.0* 9.4*  HCT 30.4* 28.2*  PLT 274 231  NA 136 138  K 5.5* 4.1  CL 103 104  CO2 28 26  BUN 10 10  CREATININE 0.78 0.66  GLUCOSE 219* 113*  CALCIUM 8.7 8.5     Discharge Medications:     Medication List    STOP taking these medications       diclofenac 75 MG EC tablet  Commonly known as:  VOLTAREN     oxyCODONE 5 MG immediate release tablet  Commonly known as:  Oxy IR/ROXICODONE      TAKE these medications       acetaminophen 650 MG CR tablet  Commonly known as:  TYLENOL  Take 650 mg by mouth 2 (two) times daily as needed for pain.     aspirin 325 MG EC tablet  Take 1 tablet (325 mg total) by mouth 2 (two) times daily.     bisacodyl 5 MG EC tablet  Commonly known as:  DULCOLAX  Take 5 mg by mouth daily as needed for constipation.     carisoprodol 350 MG tablet  Commonly known as:  SOMA  Take 350 mg by mouth 3 (three) times daily as needed for muscle spasms.  CLARITIN 10 MG tablet  Generic drug:  loratadine  Take 10 mg by mouth daily as needed for allergies.     lisinopril 20 MG tablet  Commonly known as:  PRINIVIL,ZESTRIL  Take 1 tablet (20 mg total) by mouth daily.     multivitamin tablet  Take 1 tablet by mouth daily.     omeprazole 20 MG tablet  Commonly known as:  PRILOSEC OTC  Take 20 mg by mouth daily as needed (hiatal hernia).     OVER THE COUNTER MEDICATION  Take 2 capsules by mouth daily. CholestOff     oxyCODONE-acetaminophen 5-325 MG per tablet  Commonly known as:  ROXICET  Take 1-2 tablets by mouth every 4 (four) hours as needed for pain.     TURMERIC PO  Take 1 tablet by mouth daily as needed (feet and ankle cramps.).     Vitamin D 2000 UNITS Caps  Take 2,000 Units by mouth daily.     vitamin E 400 UNIT capsule  Take 400 Units by mouth daily.        Diagnostic Studies: Dg Chest 2 View  07/24/2012    *RADIOLOGY REPORT*  Clinical Data: Preop right total knee arthroplasty  CHEST - 2 VIEW  Comparison: None.  Findings: Lungs are clear. No pleural effusion or pneumothorax.  Cardiomediastinal silhouette is within normal limits.  Mild degenerative changes of the visualized thoracolumbar spine with lower thoracic levoscoliosis.  IMPRESSION: No evidence of acute cardiopulmonary disease.   Original Report Authenticated By: Charline Bills, M.D.    Disposition:       Discharge Orders   Future Orders Complete By Expires     Call MD for:  redness, tenderness, or signs of infection (pain, swelling, redness, odor or green/yellow discharge around incision site)  As directed     Call MD for:  severe uncontrolled pain  As directed     Call MD for:  temperature >100.4  As directed     Change dressing (specify)  As directed     Comments:      Dressing change as needed.    Discharge instructions  As directed     Comments:      F/U with Dr. Turner Daniels as scheduled (14 days post-op)    Driving Restrictions  As directed     Comments:      No driving for 2 weeks.    Increase activity slowly  As directed     May shower / Bathe  As directed     Walker   As directed           Signed: Akeira Lahm M. 08/02/2012, 8:00 AM

## 2012-08-03 ENCOUNTER — Encounter (HOSPITAL_COMMUNITY): Payer: Self-pay | Admitting: General Practice

## 2012-08-03 LAB — CBC
HCT: 26.2 % — ABNORMAL LOW (ref 36.0–46.0)
Hemoglobin: 8.7 g/dL — ABNORMAL LOW (ref 12.0–15.0)
MCH: 29.8 pg (ref 26.0–34.0)
MCHC: 33.2 g/dL (ref 30.0–36.0)
MCV: 89.7 fL (ref 78.0–100.0)

## 2012-08-03 NOTE — Progress Notes (Signed)
Patient ID: Katherine Mcpherson, female   DOB: 1945-12-27, 67 y.o.   MRN: [id number] PATIENT ID: Katherine Mcpherson  MRN: [id number]  DOB/AGE:  Dec 14, 1945 / 67 y.o.  3 Days Post-Op Procedure(s) (LRB): TOTAL KNEE ARTHROPLASTY (Right)    PROGRESS NOTE Subjective: Patient is alert, oriented, no Nausea, no Vomiting, yes passing gas, no Bowel Movement. Taking PO well. Denies SOB, Chest or Calf Pain. Using Incentive Spirometer, PAS in place. Ambulate weight bearing as tolerated in the hallway, CPM 0-70 Patient reports pain as 4 on 0-10 scale  .    Objective: Vital signs in last 24 hours: Filed Vitals:   08/02/12 0700 08/02/12 1401 08/02/12 2025 08/03/12 0549  BP: 146/75 124/49 144/54 137/55  Pulse: 82 89 75 75  Temp: 98.5 F (36.9 C) 98.2 F (36.8 C) 98.8 F (37.1 C) 98.8 F (37.1 C)  TempSrc:   Oral Oral  Resp: 20 18 18 18   SpO2: 98% 100% 97% 98%      Intake/Output from previous day: I/O last 3 completed shifts: In: 900 [P.O.:900] Out: -    Intake/Output this shift:     LABORATORY DATA:  Recent Labs  08/01/12 0430 08/02/12 0610 08/03/12 0435  WBC 10.0 8.7 8.6  HGB 10.0* 9.4* 8.7*  HCT 30.4* 28.2* 26.2*  PLT 274 231 235  NA 136 138  --   K 5.5* 4.1  --   CL 103 104  --   CO2 28 26  --   BUN 10 10  --   CREATININE 0.78 0.66  --   GLUCOSE 219* 113*  --   CALCIUM 8.7 8.5  --     Examination: Neurologically intact ABD soft Neurovascular intact Sensation intact distally Intact pulses distally Dorsiflexion/Plantar flexion intact Incision: no drainage No cellulitis present Compartment soft}  Assessment:   3 Days Post-Op Procedure(s) (LRB): TOTAL KNEE ARTHROPLASTY (Right) ADDITIONAL DIAGNOSIS:  Hypertension  Plan: PT/OT WBAT, CPM 5/hrs day until ROM 0-90 degrees, then D/C CPM DVT Prophylaxis:  SCDx72hrs, ASA 325 mg BID x 2 weeks DISCHARGE PLAN: Home, probably today when patient passes physical therapy DISCHARGE NEEDS: HHPT, HHRN, CPM, Walker and 3-in-1 comode  seat     Lan Entsminger J 08/03/2012, 6:59 AM

## 2012-08-03 NOTE — Progress Notes (Signed)
Physical Therapy Treatment Patient Details Name: Katherine Mcpherson MRN: [id number] DOB: 1945/09/20 Today's Date: 08/03/2012 Time: 1191-4782 PT Time Calculation (min): 24 min  PT Assessment / Plan / Recommendation Comments on Treatment Session  Patient continuing to make awesome progress and able to tolerate stair training well. Planning to DC home today with her sister.     Follow Up Recommendations  Home health PT;Supervision/Assistance - 24 hour     Does the patient have the potential to tolerate intense rehabilitation     Barriers to Discharge        Equipment Recommendations  None recommended by PT    Recommendations for Other Services    Frequency 7X/week   Plan Discharge plan remains appropriate;Frequency remains appropriate    Precautions / Restrictions Precautions Precautions: Knee Restrictions RLE Weight Bearing: Weight bearing as tolerated   Pertinent Vitals/Pain 5/10 R knee pain. Patient premedicated    Mobility  Bed Mobility Sitting - Scoot to Edge of Bed: 5: Supervision Transfers Sit to Stand: 5: Supervision Stand to Sit: 5: Supervision Ambulation/Gait Ambulation/Gait Assistance: 5: Supervision Ambulation Distance (Feet): 150 Feet Assistive device: Rolling walker Ambulation/Gait Assistance Details: Cues for posture and RW positioning Gait Pattern: Step-to pattern Stairs: Yes Stairs Assistance: 4: Min guard Stairs Assistance Details (indicate cue type and reason): Cues for technique and sequency Stair Management Technique: Step to pattern;Forwards;Two rails Number of Stairs: 4    Exercises Total Joint Exercises Ankle Circles/Pumps: AROM;Both;10 reps;Supine Quad Sets: AROM;Right;10 reps Heel Slides: AAROM;Right;10 reps Hip ABduction/ADduction: AAROM;Right;10 reps Straight Leg Raises: AROM;Right;10 reps Long Arc Quad: AAROM;Right;Seated;5 reps   PT Diagnosis:    PT Problem List:   PT Treatment Interventions:     PT Goals Acute Rehab PT  Goals PT Goal: Supine/Side to Sit - Progress: Met PT Goal: Sit to Stand - Progress: Met PT Goal: Ambulate - Progress: Met PT Goal: Up/Down Stairs - Progress: Progressing toward goal PT Goal: Perform Home Exercise Program - Progress: Progressing toward goal  Visit Information  Last PT Received On: 08/03/12 Assistance Needed: +1    Subjective Data      Cognition  Cognition Arousal/Alertness: Awake/alert Behavior During Therapy: WFL for tasks assessed/performed Overall Cognitive Status: Within Functional Limits for tasks assessed    Balance     End of Session PT - End of Session Equipment Utilized During Treatment: Gait belt Activity Tolerance: Patient tolerated treatment well Patient left: in chair;with call bell/phone within reach Nurse Communication: Mobility status   GP     Fredrich Birks 08/03/2012, 8:15 AM  08/03/2012 Fredrich Birks PTA [phone] pager [phone] office

## 2012-08-05 DIAGNOSIS — R269 Unspecified abnormalities of gait and mobility: Secondary | ICD-10-CM | POA: Diagnosis not present

## 2012-08-05 DIAGNOSIS — Z471 Aftercare following joint replacement surgery: Secondary | ICD-10-CM | POA: Diagnosis not present

## 2012-08-05 DIAGNOSIS — M545 Low back pain, unspecified: Secondary | ICD-10-CM | POA: Diagnosis not present

## 2012-08-05 DIAGNOSIS — Z96659 Presence of unspecified artificial knee joint: Secondary | ICD-10-CM | POA: Diagnosis not present

## 2012-08-05 DIAGNOSIS — IMO0001 Reserved for inherently not codable concepts without codable children: Secondary | ICD-10-CM | POA: Diagnosis not present

## 2012-08-07 DIAGNOSIS — Z96659 Presence of unspecified artificial knee joint: Secondary | ICD-10-CM | POA: Diagnosis not present

## 2012-08-07 DIAGNOSIS — Z471 Aftercare following joint replacement surgery: Secondary | ICD-10-CM | POA: Diagnosis not present

## 2012-08-07 DIAGNOSIS — M545 Low back pain, unspecified: Secondary | ICD-10-CM | POA: Diagnosis not present

## 2012-08-07 DIAGNOSIS — R269 Unspecified abnormalities of gait and mobility: Secondary | ICD-10-CM | POA: Diagnosis not present

## 2012-08-07 DIAGNOSIS — IMO0001 Reserved for inherently not codable concepts without codable children: Secondary | ICD-10-CM | POA: Diagnosis not present

## 2012-08-09 DIAGNOSIS — Z471 Aftercare following joint replacement surgery: Secondary | ICD-10-CM | POA: Diagnosis not present

## 2012-08-09 DIAGNOSIS — IMO0001 Reserved for inherently not codable concepts without codable children: Secondary | ICD-10-CM | POA: Diagnosis not present

## 2012-08-09 DIAGNOSIS — R269 Unspecified abnormalities of gait and mobility: Secondary | ICD-10-CM | POA: Diagnosis not present

## 2012-08-09 DIAGNOSIS — M545 Low back pain, unspecified: Secondary | ICD-10-CM | POA: Diagnosis not present

## 2012-08-09 DIAGNOSIS — Z96659 Presence of unspecified artificial knee joint: Secondary | ICD-10-CM | POA: Diagnosis not present

## 2012-08-10 DIAGNOSIS — M545 Low back pain, unspecified: Secondary | ICD-10-CM | POA: Diagnosis not present

## 2012-08-10 DIAGNOSIS — R269 Unspecified abnormalities of gait and mobility: Secondary | ICD-10-CM | POA: Diagnosis not present

## 2012-08-10 DIAGNOSIS — Z471 Aftercare following joint replacement surgery: Secondary | ICD-10-CM | POA: Diagnosis not present

## 2012-08-10 DIAGNOSIS — IMO0001 Reserved for inherently not codable concepts without codable children: Secondary | ICD-10-CM | POA: Diagnosis not present

## 2012-08-10 DIAGNOSIS — Z96659 Presence of unspecified artificial knee joint: Secondary | ICD-10-CM | POA: Diagnosis not present

## 2012-08-11 DIAGNOSIS — Z471 Aftercare following joint replacement surgery: Secondary | ICD-10-CM | POA: Diagnosis not present

## 2012-08-11 DIAGNOSIS — Z96659 Presence of unspecified artificial knee joint: Secondary | ICD-10-CM | POA: Diagnosis not present

## 2012-08-11 DIAGNOSIS — M545 Low back pain, unspecified: Secondary | ICD-10-CM | POA: Diagnosis not present

## 2012-08-11 DIAGNOSIS — R269 Unspecified abnormalities of gait and mobility: Secondary | ICD-10-CM | POA: Diagnosis not present

## 2012-08-11 DIAGNOSIS — IMO0001 Reserved for inherently not codable concepts without codable children: Secondary | ICD-10-CM | POA: Diagnosis not present

## 2012-08-14 DIAGNOSIS — Z96659 Presence of unspecified artificial knee joint: Secondary | ICD-10-CM | POA: Diagnosis not present

## 2012-08-14 DIAGNOSIS — M545 Low back pain, unspecified: Secondary | ICD-10-CM | POA: Diagnosis not present

## 2012-08-14 DIAGNOSIS — IMO0001 Reserved for inherently not codable concepts without codable children: Secondary | ICD-10-CM | POA: Diagnosis not present

## 2012-08-14 DIAGNOSIS — R269 Unspecified abnormalities of gait and mobility: Secondary | ICD-10-CM | POA: Diagnosis not present

## 2012-08-14 DIAGNOSIS — Z471 Aftercare following joint replacement surgery: Secondary | ICD-10-CM | POA: Diagnosis not present

## 2012-08-15 DIAGNOSIS — M545 Low back pain, unspecified: Secondary | ICD-10-CM | POA: Diagnosis not present

## 2012-08-15 DIAGNOSIS — R269 Unspecified abnormalities of gait and mobility: Secondary | ICD-10-CM | POA: Diagnosis not present

## 2012-08-15 DIAGNOSIS — Z96659 Presence of unspecified artificial knee joint: Secondary | ICD-10-CM | POA: Diagnosis not present

## 2012-08-15 DIAGNOSIS — M1710 Unilateral primary osteoarthritis, unspecified knee: Secondary | ICD-10-CM | POA: Diagnosis not present

## 2012-08-15 DIAGNOSIS — Z471 Aftercare following joint replacement surgery: Secondary | ICD-10-CM | POA: Diagnosis not present

## 2012-08-15 DIAGNOSIS — IMO0001 Reserved for inherently not codable concepts without codable children: Secondary | ICD-10-CM | POA: Diagnosis not present

## 2012-08-16 DIAGNOSIS — M545 Low back pain, unspecified: Secondary | ICD-10-CM | POA: Diagnosis not present

## 2012-08-16 DIAGNOSIS — R269 Unspecified abnormalities of gait and mobility: Secondary | ICD-10-CM | POA: Diagnosis not present

## 2012-08-16 DIAGNOSIS — Z471 Aftercare following joint replacement surgery: Secondary | ICD-10-CM | POA: Diagnosis not present

## 2012-08-16 DIAGNOSIS — IMO0001 Reserved for inherently not codable concepts without codable children: Secondary | ICD-10-CM | POA: Diagnosis not present

## 2012-08-16 DIAGNOSIS — Z96659 Presence of unspecified artificial knee joint: Secondary | ICD-10-CM | POA: Diagnosis not present

## 2012-08-18 DIAGNOSIS — Z471 Aftercare following joint replacement surgery: Secondary | ICD-10-CM | POA: Diagnosis not present

## 2012-08-18 DIAGNOSIS — M545 Low back pain, unspecified: Secondary | ICD-10-CM | POA: Diagnosis not present

## 2012-08-18 DIAGNOSIS — R269 Unspecified abnormalities of gait and mobility: Secondary | ICD-10-CM | POA: Diagnosis not present

## 2012-08-18 DIAGNOSIS — Z96659 Presence of unspecified artificial knee joint: Secondary | ICD-10-CM | POA: Diagnosis not present

## 2012-08-18 DIAGNOSIS — IMO0001 Reserved for inherently not codable concepts without codable children: Secondary | ICD-10-CM | POA: Diagnosis not present

## 2012-08-21 DIAGNOSIS — M48061 Spinal stenosis, lumbar region without neurogenic claudication: Secondary | ICD-10-CM | POA: Diagnosis not present

## 2012-08-21 DIAGNOSIS — M545 Low back pain, unspecified: Secondary | ICD-10-CM | POA: Diagnosis not present

## 2012-08-21 DIAGNOSIS — M4310 Spondylolisthesis, site unspecified: Secondary | ICD-10-CM | POA: Diagnosis not present

## 2012-08-21 DIAGNOSIS — M5106 Intervertebral disc disorders with myelopathy, lumbar region: Secondary | ICD-10-CM | POA: Diagnosis not present

## 2012-09-01 DIAGNOSIS — M47817 Spondylosis without myelopathy or radiculopathy, lumbosacral region: Secondary | ICD-10-CM | POA: Diagnosis not present

## 2012-09-01 DIAGNOSIS — M5137 Other intervertebral disc degeneration, lumbosacral region: Secondary | ICD-10-CM | POA: Diagnosis not present

## 2012-09-01 DIAGNOSIS — M545 Low back pain, unspecified: Secondary | ICD-10-CM | POA: Diagnosis not present

## 2012-09-01 DIAGNOSIS — IMO0001 Reserved for inherently not codable concepts without codable children: Secondary | ICD-10-CM | POA: Diagnosis not present

## 2012-09-14 DIAGNOSIS — M461 Sacroiliitis, not elsewhere classified: Secondary | ICD-10-CM | POA: Diagnosis not present

## 2012-09-14 DIAGNOSIS — M961 Postlaminectomy syndrome, not elsewhere classified: Secondary | ICD-10-CM | POA: Diagnosis not present

## 2012-09-14 DIAGNOSIS — M545 Low back pain, unspecified: Secondary | ICD-10-CM | POA: Diagnosis not present

## 2012-09-26 DIAGNOSIS — M461 Sacroiliitis, not elsewhere classified: Secondary | ICD-10-CM | POA: Diagnosis not present

## 2012-09-26 DIAGNOSIS — M961 Postlaminectomy syndrome, not elsewhere classified: Secondary | ICD-10-CM | POA: Diagnosis not present

## 2012-10-02 DIAGNOSIS — M25569 Pain in unspecified knee: Secondary | ICD-10-CM | POA: Diagnosis not present

## 2012-10-16 DIAGNOSIS — M5430 Sciatica, unspecified side: Secondary | ICD-10-CM | POA: Diagnosis not present

## 2012-10-16 DIAGNOSIS — M545 Low back pain, unspecified: Secondary | ICD-10-CM | POA: Diagnosis not present

## 2012-10-16 DIAGNOSIS — M961 Postlaminectomy syndrome, not elsewhere classified: Secondary | ICD-10-CM | POA: Diagnosis not present

## 2012-10-18 ENCOUNTER — Other Ambulatory Visit: Payer: Self-pay

## 2012-10-18 ENCOUNTER — Encounter: Payer: Self-pay | Admitting: Internal Medicine

## 2012-10-31 DIAGNOSIS — M545 Low back pain, unspecified: Secondary | ICD-10-CM | POA: Diagnosis not present

## 2012-10-31 DIAGNOSIS — M5430 Sciatica, unspecified side: Secondary | ICD-10-CM | POA: Diagnosis not present

## 2012-10-31 DIAGNOSIS — M961 Postlaminectomy syndrome, not elsewhere classified: Secondary | ICD-10-CM | POA: Diagnosis not present

## 2012-11-12 ENCOUNTER — Other Ambulatory Visit: Payer: Self-pay | Admitting: Family Medicine

## 2012-11-28 DIAGNOSIS — Z23 Encounter for immunization: Secondary | ICD-10-CM | POA: Diagnosis not present

## 2012-11-30 ENCOUNTER — Other Ambulatory Visit: Payer: Self-pay | Admitting: Gynecology

## 2012-11-30 DIAGNOSIS — M545 Low back pain, unspecified: Secondary | ICD-10-CM | POA: Diagnosis not present

## 2012-11-30 DIAGNOSIS — Z1231 Encounter for screening mammogram for malignant neoplasm of breast: Secondary | ICD-10-CM

## 2012-11-30 DIAGNOSIS — M4120 Other idiopathic scoliosis, site unspecified: Secondary | ICD-10-CM | POA: Diagnosis not present

## 2012-11-30 DIAGNOSIS — M47817 Spondylosis without myelopathy or radiculopathy, lumbosacral region: Secondary | ICD-10-CM | POA: Diagnosis not present

## 2012-12-01 ENCOUNTER — Ambulatory Visit (HOSPITAL_COMMUNITY)
Admission: RE | Admit: 2012-12-01 | Discharge: 2012-12-01 | Disposition: A | Discharge status: Home / Self Care | Payer: Medicare Other | Source: Ambulatory Visit | Attending: Gynecology | Admitting: Gynecology

## 2012-12-01 DIAGNOSIS — Z1231 Encounter for screening mammogram for malignant neoplasm of breast: Secondary | ICD-10-CM | POA: Insufficient documentation

## 2012-12-12 DIAGNOSIS — M545 Low back pain, unspecified: Secondary | ICD-10-CM | POA: Diagnosis not present

## 2012-12-12 DIAGNOSIS — M47817 Spondylosis without myelopathy or radiculopathy, lumbosacral region: Secondary | ICD-10-CM | POA: Diagnosis not present

## 2012-12-21 DIAGNOSIS — M1710 Unilateral primary osteoarthritis, unspecified knee: Secondary | ICD-10-CM | POA: Diagnosis not present

## 2012-12-23 ENCOUNTER — Encounter: Payer: Self-pay | Admitting: Internal Medicine

## 2012-12-24 MED ORDER — LISINOPRIL 20 MG PO TABS: 20.0000 mg | ORAL_TABLET | Freq: Every day | ORAL | Status: DC

## 2012-12-24 NOTE — Telephone Encounter (Signed)
Ok to refill w/ f/u after surgery

## 2012-12-25 ENCOUNTER — Encounter: Payer: Self-pay | Admitting: Family Medicine

## 2012-12-26 NOTE — Telephone Encounter (Signed)
error 

## 2013-01-02 DIAGNOSIS — M545 Low back pain, unspecified: Secondary | ICD-10-CM | POA: Diagnosis not present

## 2013-01-02 DIAGNOSIS — M4120 Other idiopathic scoliosis, site unspecified: Secondary | ICD-10-CM | POA: Diagnosis not present

## 2013-01-03 ENCOUNTER — Other Ambulatory Visit: Payer: Self-pay | Admitting: Orthopedic Surgery

## 2013-01-05 ENCOUNTER — Encounter: Payer: Self-pay | Admitting: Internal Medicine

## 2013-01-08 ENCOUNTER — Encounter: Payer: Self-pay | Admitting: Family Medicine

## 2013-01-10 ENCOUNTER — Encounter (HOSPITAL_COMMUNITY): Payer: Self-pay | Admitting: Pharmacy Technician

## 2013-01-10 ENCOUNTER — Encounter: Payer: Self-pay | Admitting: Internal Medicine

## 2013-01-11 DIAGNOSIS — W010XXA Fall on same level from slipping, tripping and stumbling without subsequent striking against object, initial encounter: Secondary | ICD-10-CM

## 2013-01-11 HISTORY — DX: Fall on same level from slipping, tripping and stumbling without subsequent striking against object, initial encounter: W01.0XXA

## 2013-01-12 ENCOUNTER — Encounter (HOSPITAL_COMMUNITY)
Admission: RE | Admit: 2013-01-12 | Discharge: 2013-01-12 | Disposition: A | Discharge status: Home / Self Care | Payer: Medicare Other | Source: Ambulatory Visit | Attending: Orthopedic Surgery | Admitting: Orthopedic Surgery

## 2013-01-12 ENCOUNTER — Encounter (HOSPITAL_COMMUNITY): Payer: Self-pay

## 2013-01-12 DIAGNOSIS — Z01812 Encounter for preprocedural laboratory examination: Secondary | ICD-10-CM | POA: Diagnosis not present

## 2013-01-12 DIAGNOSIS — Z01818 Encounter for other preprocedural examination: Secondary | ICD-10-CM | POA: Diagnosis not present

## 2013-01-12 LAB — CBC WITH DIFFERENTIAL/PLATELET
Basophils Absolute: 0 10*3/uL (ref 0.0–0.1)
Eosinophils Absolute: 0.1 10*3/uL (ref 0.0–0.7)
HCT: 36.9 % (ref 36.0–46.0)
Hemoglobin: 12.4 g/dL (ref 12.0–15.0)
Lymphocytes Relative: 27 % (ref 12–46)
MCH: 30.2 pg (ref 26.0–34.0)
MCHC: 33.6 g/dL (ref 30.0–36.0)
MCV: 90 fL (ref 78.0–100.0)
Monocytes Absolute: 0.5 10*3/uL (ref 0.1–1.0)
Monocytes Relative: 6 % (ref 3–12)
Neutro Abs: 5.2 10*3/uL (ref 1.7–7.7)
Platelets: 306 10*3/uL (ref 150–400)
RDW: 13.8 % (ref 11.5–15.5)
WBC: 8 10*3/uL (ref 4.0–10.5)

## 2013-01-12 LAB — URINALYSIS, ROUTINE W REFLEX MICROSCOPIC
Protein, ur: NEGATIVE mg/dL
Urobilinogen, UA: 0.2 mg/dL (ref 0.0–1.0)

## 2013-01-12 LAB — BASIC METABOLIC PANEL
BUN: 21 mg/dL (ref 6–23)
Chloride: 105 mEq/L (ref 96–112)
GFR calc Af Amer: 81 mL/min — ABNORMAL LOW (ref >90)
Potassium: 4.1 mEq/L (ref 3.5–5.1)

## 2013-01-12 LAB — APTT: aPTT: 30 seconds (ref 24–37)

## 2013-01-12 LAB — TYPE AND SCREEN
ABO/RH(D): A POS
Antibody Screen: NEGATIVE

## 2013-01-12 LAB — URINE MICROSCOPIC-ADD ON

## 2013-01-12 LAB — SURGICAL PCR SCREEN: MRSA, PCR: NEGATIVE

## 2013-01-12 MED ORDER — CHLORHEXIDINE GLUCONATE 4 % EX LIQD: 60.0000 mL | Freq: Once | CUTANEOUS | Status: DC

## 2013-01-12 NOTE — Pre-Procedure Instructions (Signed)
Katherine Mcpherson  01/12/2013   Your procedure is scheduled on:  Monday January 22, 2013 at 0730 AM  Report to [facility] Short Stay Main Entrance "A" at 0530 AM.  Call this number if you have problems the morning of surgery: [phone]   Remember:   Do not eat food or drink liquids after midnight Sunday.   Take these medicines the morning of surgery with A SIP OF WATER: Oxycodone or Tylenol if needed for pain, Claritin if needed   Stop Diclofenac and Vitamins 5 days prior to surgery 01/16/13  Do not wear jewelry, make-up or nail polish.  Do not wear lotions, powders, or perfumes. You may wear deodorant.  Do not shave 48 hours prior to surgery.   Do not bring valuables to the hospital.  [facility] is not responsible for any belongings or valuables.               Contacts, dentures or bridgework may not be worn into surgery.  Leave suitcase in the car. After surgery it may be brought to your room.  For patients admitted to the hospital, discharge time is determined by your treatment team.               Patients discharged the day of surgery will not be allowed to drive home.    Special Instructions: Incentive Spirometry - Practice and bring it with you on the day of surgery. Shower using CHG 2 nights before surgery and the night before surgery.  If you shower the day of surgery use CHG.  Use special wash - you have one bottle of CHG for all showers.  You should use approximately 1/3 of the bottle for each shower.   Please read over the following fact sheets that you were given: Pain Booklet, Coughing and Deep Breathing, Blood Transfusion Information, Total Joint Packet, MRSA Information and Surgical Site Infection Prevention

## 2013-01-12 NOTE — Telephone Encounter (Signed)
Do you have the documentation? I will abstract them, can you put in my box?

## 2013-01-18 ENCOUNTER — Other Ambulatory Visit: Payer: Self-pay

## 2013-01-18 DIAGNOSIS — M545 Low back pain, unspecified: Secondary | ICD-10-CM | POA: Diagnosis not present

## 2013-01-18 DIAGNOSIS — M4120 Other idiopathic scoliosis, site unspecified: Secondary | ICD-10-CM | POA: Diagnosis not present

## 2013-01-18 NOTE — H&P (Signed)
TOTAL KNEE ADMISSION H&P  Patient is being admitted for left total knee arthroplasty.  Subjective:  Chief Complaint:left knee pain.  HPI: Katherine Mcpherson, 67 y.o. female, has a history of pain and functional disability in the left knee due to arthritis and has failed non-surgical conservative treatments for greater than 12 weeks to includeNSAID's and/or analgesics and activity modification.  Onset of symptoms was gradual, starting many years ago with gradually worsening course since that time. The patient noted no past surgery on the left knee(s).  Patient currently rates pain in the left knee(s) at 10 out of 10 with activity. Patient has night pain, worsening of pain with activity and weight bearing, pain that interferes with activities of daily living and crepitus.  Patient has evidence of joint space narrowing by imaging studies. There is no active infection.  Patient Active Problem List   Diagnosis Date Noted  . Osteoarthritis of right knee 07/31/2012  . HTN (hypertension) 12/30/2011  . Overweight 12/30/2011  . Vaginal atrophy 12/30/2011  . Menopausal state 12/30/2011  . Low back pain 12/15/2011   Past Medical History  Diagnosis Date  . Hypertension   . Polyp of colon 10/2009  . Allergy   . PONV (postoperative nausea and vomiting)   . GERD (gastroesophageal reflux disease)   . H/O hiatal hernia   . High cholesterol   . Arthritis     "qwhere" (08/03/2012)  . Chronic lower back pain     "for the time being" (08/03/2012)    Past Surgical History  Procedure Laterality Date  . Colonoscopy  08/2007    TUBULAR ADENOMAWITH DYSPLASIA  . Hammer toe surgery Right 2002  . Knee arthroscopy Left 2001  . Ankle fracture surgery Right 1985  . Posterior lumbar fusion  2013    L 5-S1  . Total knee arthroplasty Right 07/31/2012    Procedure: TOTAL KNEE ARTHROPLASTY;  Surgeon: [name], MD;  Location: [facility] OR;  Service: Orthopedics;  Laterality: Right;  DEPUY/SIGMA  . Foot fracture surgery  Left 1986    "piece of bone removed" (08/03/2012)  . Ankle hardware removal Right 1986  . Abdominal hysterectomy  1999    TAH/BSO    No prescriptions prior to admission   Allergies  Allergen Reactions  . Sulfa Antibiotics Other (See Comments)    Makes tongue feel funny and makes her feel like she will pass  Out.  Last for days.  . Penicillins Itching    Childhood allergy.   . Latex Rash    Had rash on lft side only after back surgery    History  Substance Use Topics  . Smoking status: Former Smoker -- 0.50 packs/day for 8 years    Types: Cigarettes    Quit date: 03/15/1972  . Smokeless tobacco: Never Used  . Alcohol Use: Yes     Comment: 08/03/2012 "maybe 2 glasses of wine/month"    Family History  Problem Relation Age of Onset  . Osteoporosis Mother   . Hypertension Sister   . Hypertension Sister   . Cancer Paternal Uncle     PANCREATIC     Review of Systems  Constitutional: Negative.   HENT: Negative.   Eyes: Negative.   Respiratory: Negative.   Cardiovascular: Negative.   Gastrointestinal: Negative.   Genitourinary: Negative.   Musculoskeletal: Positive for joint pain.  Skin: Negative.   Neurological: Negative.   Endo/Heme/Allergies: Negative.   Psychiatric/Behavioral: Negative.     Objective:  Physical Exam  Constitutional: She is  oriented to person, place, and time. She appears well-developed and well-nourished.  HENT:  Head: Normocephalic and atraumatic.  Eyes: Pupils are equal, round, and reactive to light.  Neck: Normal range of motion.  Cardiovascular: Intact distal pulses.   Respiratory: Effort normal.  Musculoskeletal: She exhibits tenderness.  Neurological: She is alert and oriented to person, place, and time.  Skin: Skin is warm and dry.  Psychiatric: She has a normal mood and affect. Her behavior is normal. Judgment normal.    Vital signs in last 24 hours:    Labs:   Estimated body mass index is 32.93 kg/(m^2) as calculated from the  following:   Height as of 07/24/12: 5\' 8"  (1.727 m).   Weight as of 07/24/12: 98.2 kg (216 lb 7.9 oz).   Imaging Review Prior x-rays reviewed and do show bone-on-bone arthritic changes valgus deformity to the knee with peripheral osteophytes.  Assessment/Plan:  End stage arthritis, left knee   The patient history, physical examination, clinical judgment of the provider and imaging studies are consistent with end stage degenerative joint disease of the left knee(s) and total knee arthroplasty is deemed medically necessary. The treatment options including medical management, injection therapy arthroscopy and arthroplasty were discussed at length. The risks and benefits of total knee arthroplasty were presented and reviewed. The risks due to aseptic loosening, infection, stiffness, patella tracking problems, thromboembolic complications and other imponderables were discussed. The patient acknowledged the explanation, agreed to proceed with the plan and consent was signed. Patient is being admitted for inpatient treatment for surgery, pain control, PT, OT, prophylactic antibiotics, VTE prophylaxis, progressive ambulation and ADL's and discharge planning.

## 2013-01-21 MED ORDER — VANCOMYCIN HCL 10 G IV SOLR
1500.0000 mg | INTRAVENOUS | Status: AC
  Administered 2013-01-22: 1500 mg via INTRAVENOUS
  Filled 2013-01-21: qty 1500

## 2013-01-22 ENCOUNTER — Inpatient Hospital Stay (HOSPITAL_COMMUNITY)
Admission: RE | Admit: 2013-01-22 | Discharge: 2013-01-25 | DRG: 470 | Disposition: A | Discharge status: Home Health | Payer: Medicare Other | Source: Ambulatory Visit | Attending: Orthopedic Surgery | Admitting: Orthopedic Surgery

## 2013-01-22 ENCOUNTER — Inpatient Hospital Stay (HOSPITAL_COMMUNITY): Payer: Medicare Other | Admitting: Anesthesiology

## 2013-01-22 ENCOUNTER — Encounter (HOSPITAL_COMMUNITY): Payer: Self-pay | Admitting: Certified Registered Nurse Anesthetist

## 2013-01-22 ENCOUNTER — Encounter (HOSPITAL_COMMUNITY)
Admission: RE | Disposition: A | Discharge status: Home Health | Payer: Self-pay | Source: Ambulatory Visit | Attending: Orthopedic Surgery

## 2013-01-22 ENCOUNTER — Encounter (HOSPITAL_COMMUNITY): Payer: Medicare Other | Admitting: Anesthesiology

## 2013-01-22 DIAGNOSIS — M1710 Unilateral primary osteoarthritis, unspecified knee: Secondary | ICD-10-CM | POA: Diagnosis not present

## 2013-01-22 DIAGNOSIS — M1712 Unilateral primary osteoarthritis, left knee: Secondary | ICD-10-CM | POA: Diagnosis present

## 2013-01-22 DIAGNOSIS — K449 Diaphragmatic hernia without obstruction or gangrene: Secondary | ICD-10-CM | POA: Diagnosis present

## 2013-01-22 DIAGNOSIS — IMO0002 Reserved for concepts with insufficient information to code with codable children: Secondary | ICD-10-CM | POA: Diagnosis not present

## 2013-01-22 DIAGNOSIS — E663 Overweight: Secondary | ICD-10-CM | POA: Diagnosis present

## 2013-01-22 DIAGNOSIS — Z6833 Body mass index (BMI) 33.0-33.9, adult: Secondary | ICD-10-CM

## 2013-01-22 DIAGNOSIS — M545 Low back pain, unspecified: Secondary | ICD-10-CM | POA: Diagnosis present

## 2013-01-22 DIAGNOSIS — M549 Dorsalgia, unspecified: Secondary | ICD-10-CM | POA: Diagnosis not present

## 2013-01-22 DIAGNOSIS — K219 Gastro-esophageal reflux disease without esophagitis: Secondary | ICD-10-CM | POA: Diagnosis present

## 2013-01-22 DIAGNOSIS — G8918 Other acute postprocedural pain: Secondary | ICD-10-CM | POA: Diagnosis not present

## 2013-01-22 DIAGNOSIS — I10 Essential (primary) hypertension: Secondary | ICD-10-CM | POA: Diagnosis not present

## 2013-01-22 DIAGNOSIS — M25569 Pain in unspecified knee: Secondary | ICD-10-CM | POA: Diagnosis not present

## 2013-01-22 HISTORY — DX: Anemia, unspecified: D64.9

## 2013-01-22 HISTORY — DX: Fall on same level from slipping, tripping and stumbling without subsequent striking against object, initial encounter: W01.0XXA

## 2013-01-22 HISTORY — PX: TOTAL KNEE ARTHROPLASTY: SHX125

## 2013-01-22 SURGERY — ARTHROPLASTY, KNEE, TOTAL
Anesthesia: General | Site: Knee | Laterality: Left | Wound class: Clean

## 2013-01-22 MED ORDER — BUPIVACAINE LIPOSOME 1.3 % IJ SUSP
20.0000 mL | Freq: Once | INTRAMUSCULAR | Status: DC
  Filled 2013-01-22: qty 20

## 2013-01-22 MED ORDER — CEFUROXIME SODIUM 1.5 G IJ SOLR
INTRAMUSCULAR | Status: AC
  Filled 2013-01-22: qty 1.5

## 2013-01-22 MED ORDER — CARISOPRODOL 350 MG PO TABS: 350.0000 mg | ORAL_TABLET | Freq: Three times a day (TID) | ORAL | Status: DC | PRN

## 2013-01-22 MED ORDER — ACETAMINOPHEN 650 MG RE SUPP: 650.0000 mg | Freq: Four times a day (QID) | RECTAL | Status: DC | PRN

## 2013-01-22 MED ORDER — METOCLOPRAMIDE HCL 10 MG PO TABS: 5.0000 mg | ORAL_TABLET | Freq: Three times a day (TID) | ORAL | Status: DC | PRN

## 2013-01-22 MED ORDER — FENTANYL CITRATE 0.05 MG/ML IJ SOLN
INTRAMUSCULAR | Status: DC | PRN
  Administered 2013-01-22 (×3): 25 ug via INTRAVENOUS
  Administered 2013-01-22: 100 ug via INTRAVENOUS
  Administered 2013-01-22: 50 ug via INTRAVENOUS
  Administered 2013-01-22 (×4): 25 ug via INTRAVENOUS
  Administered 2013-01-22 (×2): 50 ug via INTRAVENOUS
  Administered 2013-01-22 (×7): 25 ug via INTRAVENOUS

## 2013-01-22 MED ORDER — WHITE PETROLATUM GEL
Status: AC
  Administered 2013-01-22: 14:00:00
  Filled 2013-01-22: qty 5

## 2013-01-22 MED ORDER — SODIUM CHLORIDE 0.9 % IR SOLN
Status: DC | PRN
  Administered 2013-01-22: 3000 mL

## 2013-01-22 MED ORDER — LISINOPRIL 20 MG PO TABS
20.0000 mg | ORAL_TABLET | Freq: Every day | ORAL | Status: DC
  Administered 2013-01-24 – 2013-01-25 (×2): 20 mg via ORAL
  Filled 2013-01-22 (×4): qty 1

## 2013-01-22 MED ORDER — ONDANSETRON HCL 4 MG/2ML IJ SOLN: 4.0000 mg | Freq: Four times a day (QID) | INTRAMUSCULAR | Status: DC | PRN

## 2013-01-22 MED ORDER — ALUM & MAG HYDROXIDE-SIMETH 200-200-20 MG/5ML PO SUSP
30.0000 mL | ORAL | Status: DC | PRN
  Administered 2013-01-22: 30 mL via ORAL
  Filled 2013-01-22: qty 30

## 2013-01-22 MED ORDER — TRANEXAMIC ACID 100 MG/ML IV SOLN
1000.0000 mg | INTRAVENOUS | Status: AC
  Filled 2013-01-22: qty 10

## 2013-01-22 MED ORDER — PROPOFOL 10 MG/ML IV BOLUS
INTRAVENOUS | Status: DC | PRN
  Administered 2013-01-22: 200 mg via INTRAVENOUS

## 2013-01-22 MED ORDER — 0.9 % SODIUM CHLORIDE (POUR BTL) OPTIME
TOPICAL | Status: DC | PRN
  Administered 2013-01-22: 1000 mL

## 2013-01-22 MED ORDER — DEXAMETHASONE SODIUM PHOSPHATE 4 MG/ML IJ SOLN
INTRAMUSCULAR | Status: DC | PRN
  Administered 2013-01-22: 8 mg via INTRAVENOUS

## 2013-01-22 MED ORDER — EPHEDRINE SULFATE 50 MG/ML IJ SOLN
INTRAMUSCULAR | Status: DC | PRN
  Administered 2013-01-22 (×3): 5 mg via INTRAVENOUS

## 2013-01-22 MED ORDER — DIPHENHYDRAMINE HCL 12.5 MG/5ML PO ELIX: 12.5000 mg | ORAL_SOLUTION | ORAL | Status: DC | PRN

## 2013-01-22 MED ORDER — METOCLOPRAMIDE HCL 5 MG/ML IJ SOLN: 5.0000 mg | Freq: Three times a day (TID) | INTRAMUSCULAR | Status: DC | PRN

## 2013-01-22 MED ORDER — SCOPOLAMINE 1 MG/3DAYS TD PT72
1.0000 | MEDICATED_PATCH | TRANSDERMAL | Status: AC
  Administered 2013-01-22: 1 via TRANSDERMAL

## 2013-01-22 MED ORDER — OXYCODONE HCL 5 MG PO TABS: 5.0000 mg | ORAL_TABLET | Freq: Once | ORAL | Status: DC | PRN

## 2013-01-22 MED ORDER — HYDROMORPHONE HCL PF 1 MG/ML IJ SOLN
0.5000 mg | INTRAMUSCULAR | Status: DC | PRN
  Administered 2013-01-22: 0.5 mg via INTRAVENOUS

## 2013-01-22 MED ORDER — OXYCODONE-ACETAMINOPHEN 5-325 MG PO TABS: 1.0000 | ORAL_TABLET | ORAL | Status: DC | PRN

## 2013-01-22 MED ORDER — BUPIVACAINE-EPINEPHRINE PF 0.5-1:200000 % IJ SOLN
INTRAMUSCULAR | Status: DC | PRN
  Administered 2013-01-22: 30 mL

## 2013-01-22 MED ORDER — OMEPRAZOLE MAGNESIUM 20 MG PO TBEC
20.0000 mg | DELAYED_RELEASE_TABLET | Freq: Every day | ORAL | Status: DC | PRN
Start: 2013-01-22 — End: 2013-01-22

## 2013-01-22 MED ORDER — BUPIVACAINE LIPOSOME 1.3 % IJ SUSP
INTRAMUSCULAR | Status: DC | PRN
  Administered 2013-01-22: 08:00:00

## 2013-01-22 MED ORDER — LORATADINE 10 MG PO TABS
10.0000 mg | ORAL_TABLET | Freq: Every day | ORAL | Status: DC | PRN
  Filled 2013-01-22: qty 1

## 2013-01-22 MED ORDER — ONDANSETRON HCL 4 MG PO TABS: 4.0000 mg | ORAL_TABLET | Freq: Four times a day (QID) | ORAL | Status: DC | PRN

## 2013-01-22 MED ORDER — LIDOCAINE HCL (CARDIAC) 20 MG/ML IV SOLN
INTRAVENOUS | Status: DC | PRN
  Administered 2013-01-22: 80 mg via INTRAVENOUS

## 2013-01-22 MED ORDER — KCL IN DEXTROSE-NACL 20-5-0.45 MEQ/L-%-% IV SOLN
INTRAVENOUS | Status: DC
  Administered 2013-01-22 – 2013-01-23 (×3): via INTRAVENOUS
  Filled 2013-01-22 (×11): qty 1000

## 2013-01-22 MED ORDER — ASPIRIN EC 325 MG PO TBEC: 325.0000 mg | DELAYED_RELEASE_TABLET | Freq: Two times a day (BID) | ORAL | Status: DC

## 2013-01-22 MED ORDER — MIDAZOLAM HCL 5 MG/5ML IJ SOLN
INTRAMUSCULAR | Status: DC | PRN
  Administered 2013-01-22: 2 mg via INTRAVENOUS

## 2013-01-22 MED ORDER — BISACODYL 5 MG PO TBEC: 5.0000 mg | DELAYED_RELEASE_TABLET | Freq: Every day | ORAL | Status: DC | PRN

## 2013-01-22 MED ORDER — TRANEXAMIC ACID 100 MG/ML IV SOLN
1000.0000 mg | INTRAVENOUS | Status: AC
  Administered 2013-01-22: 1000 mg via INTRAVENOUS
  Filled 2013-01-22: qty 10

## 2013-01-22 MED ORDER — METHOCARBAMOL 500 MG PO TABS
500.0000 mg | ORAL_TABLET | Freq: Four times a day (QID) | ORAL | Status: DC | PRN
  Administered 2013-01-22 – 2013-01-25 (×6): 500 mg via ORAL
  Filled 2013-01-22 (×5): qty 1

## 2013-01-22 MED ORDER — DOCUSATE SODIUM 100 MG PO CAPS
100.0000 mg | ORAL_CAPSULE | Freq: Two times a day (BID) | ORAL | Status: DC
  Administered 2013-01-22 – 2013-01-25 (×6): 100 mg via ORAL
  Filled 2013-01-22 (×7): qty 1

## 2013-01-22 MED ORDER — SCOPOLAMINE 1 MG/3DAYS TD PT72
MEDICATED_PATCH | TRANSDERMAL | Status: AC
  Filled 2013-01-22: qty 1

## 2013-01-22 MED ORDER — MAGNESIUM CITRATE PO SOLN
1.0000 | Freq: Once | ORAL | Status: AC | PRN
  Filled 2013-01-22: qty 296

## 2013-01-22 MED ORDER — HYDROMORPHONE HCL PF 1 MG/ML IJ SOLN
INTRAMUSCULAR | Status: AC
  Filled 2013-01-22: qty 1

## 2013-01-22 MED ORDER — HYDROMORPHONE HCL PF 1 MG/ML IJ SOLN
0.5000 mg | INTRAMUSCULAR | Status: DC | PRN
  Administered 2013-01-22 – 2013-01-23 (×6): 0.5 mg via INTRAVENOUS
  Filled 2013-01-22 (×5): qty 1

## 2013-01-22 MED ORDER — DEXTROSE-NACL 5-0.45 % IV SOLN: INTRAVENOUS | Status: DC

## 2013-01-22 MED ORDER — ONDANSETRON HCL 4 MG/2ML IJ SOLN
INTRAMUSCULAR | Status: DC | PRN
  Administered 2013-01-22: 4 mg via INTRAVENOUS

## 2013-01-22 MED ORDER — PROMETHAZINE HCL 25 MG/ML IJ SOLN: 6.2500 mg | INTRAMUSCULAR | Status: DC | PRN

## 2013-01-22 MED ORDER — BUPIVACAINE LIPOSOME 1.3 % IJ SUSP
20.0000 mL | INTRAMUSCULAR | Status: DC
  Filled 2013-01-22: qty 20

## 2013-01-22 MED ORDER — SODIUM CHLORIDE 0.9 % IV SOLN
INTRAVENOUS | Status: DC | PRN
  Administered 2013-01-22: 08:00:00 via INTRAVENOUS

## 2013-01-22 MED ORDER — METHOCARBAMOL 500 MG PO TABS
ORAL_TABLET | ORAL | Status: AC
  Filled 2013-01-22: qty 1

## 2013-01-22 MED ORDER — PHENOL 1.4 % MT LIQD: 1.0000 | OROMUCOSAL | Status: DC | PRN

## 2013-01-22 MED ORDER — OXYCODONE HCL 5 MG/5ML PO SOLN: 5.0000 mg | Freq: Once | ORAL | Status: DC | PRN

## 2013-01-22 MED ORDER — OXYCODONE HCL 5 MG PO TABS
ORAL_TABLET | ORAL | Status: AC
  Filled 2013-01-22: qty 2

## 2013-01-22 MED ORDER — ACETAMINOPHEN 325 MG PO TABS
650.0000 mg | ORAL_TABLET | Freq: Four times a day (QID) | ORAL | Status: DC | PRN
  Administered 2013-01-23 – 2013-01-24 (×3): 650 mg via ORAL
  Filled 2013-01-22 (×3): qty 2

## 2013-01-22 MED ORDER — HYDROMORPHONE HCL PF 1 MG/ML IJ SOLN
0.2500 mg | INTRAMUSCULAR | Status: DC | PRN
  Administered 2013-01-22 (×4): 0.5 mg via INTRAVENOUS

## 2013-01-22 MED ORDER — GABAPENTIN 300 MG PO CAPS
300.0000 mg | ORAL_CAPSULE | Freq: Three times a day (TID) | ORAL | Status: DC
  Administered 2013-01-22 – 2013-01-25 (×3): 300 mg via ORAL
  Filled 2013-01-22 (×12): qty 1

## 2013-01-22 MED ORDER — SENNOSIDES-DOCUSATE SODIUM 8.6-50 MG PO TABS: 1.0000 | ORAL_TABLET | Freq: Every evening | ORAL | Status: DC | PRN

## 2013-01-22 MED ORDER — MENTHOL 3 MG MT LOZG: 1.0000 | LOZENGE | OROMUCOSAL | Status: DC | PRN

## 2013-01-22 MED ORDER — PANTOPRAZOLE SODIUM 40 MG PO TBEC
40.0000 mg | DELAYED_RELEASE_TABLET | Freq: Every day | ORAL | Status: DC
  Administered 2013-01-23 – 2013-01-25 (×3): 40 mg via ORAL
  Filled 2013-01-22 (×3): qty 1

## 2013-01-22 MED ORDER — CEFUROXIME SODIUM 1.5 G IJ SOLR
INTRAMUSCULAR | Status: DC | PRN
  Administered 2013-01-22: 1.5 g

## 2013-01-22 MED ORDER — LACTATED RINGERS IV SOLN
INTRAVENOUS | Status: DC | PRN
  Administered 2013-01-22 (×2): via INTRAVENOUS

## 2013-01-22 MED ORDER — OXYCODONE HCL 5 MG PO TABS
5.0000 mg | ORAL_TABLET | ORAL | Status: DC | PRN
  Administered 2013-01-22 – 2013-01-25 (×18): 10 mg via ORAL
  Administered 2013-01-25: 5 mg via ORAL
  Filled 2013-01-22 (×16): qty 2
  Filled 2013-01-22: qty 1
  Filled 2013-01-22: qty 2

## 2013-01-22 MED ORDER — DEXTROSE 5 % IV SOLN
500.0000 mg | Freq: Four times a day (QID) | INTRAVENOUS | Status: DC | PRN
  Filled 2013-01-22: qty 5

## 2013-01-22 MED ORDER — ASPIRIN EC 325 MG PO TBEC
325.0000 mg | DELAYED_RELEASE_TABLET | Freq: Every day | ORAL | Status: DC
  Administered 2013-01-23 – 2013-01-25 (×3): 325 mg via ORAL
  Filled 2013-01-22 (×4): qty 1

## 2013-01-22 SURGICAL SUPPLY — 62 items
BANDAGE ELASTIC 6 VELCRO ST LF (GAUZE/BANDAGES/DRESSINGS) ×1 IMPLANT
BANDAGE ESMARK 6X9 LF (GAUZE/BANDAGES/DRESSINGS) ×1 IMPLANT
BLADE SAG 18X100X1.27 (BLADE) ×2 IMPLANT
BLADE SAW SGTL 13X75X1.27 (BLADE) ×2 IMPLANT
BLADE SURG ROTATE 9660 (MISCELLANEOUS) IMPLANT
BNDG CMPR 9X6 STRL LF SNTH (GAUZE/BANDAGES/DRESSINGS) ×1
BNDG CMPR MED 10X6 ELC LF (GAUZE/BANDAGES/DRESSINGS) ×1
BNDG ELASTIC 6X10 VLCR STRL LF (GAUZE/BANDAGES/DRESSINGS) ×2 IMPLANT
BNDG ESMARK 6X9 LF (GAUZE/BANDAGES/DRESSINGS) ×2
BOWL SMART MIX CTS (DISPOSABLE) ×2 IMPLANT
CAPT RP KNEE ×1 IMPLANT
CEMENT HV SMART SET (Cement) ×4 IMPLANT
CLOTH BEACON ORANGE TIMEOUT ST (SAFETY) ×2 IMPLANT
COVER SURGICAL LIGHT HANDLE (MISCELLANEOUS) ×2 IMPLANT
CUFF TOURNIQUET SINGLE 34IN LL (TOURNIQUET CUFF) IMPLANT
CUFF TOURNIQUET SINGLE 44IN (TOURNIQUET CUFF) IMPLANT
DRAPE EXTREMITY T 121X128X90 (DRAPE) ×2 IMPLANT
DRAPE U-SHAPE 47X51 STRL (DRAPES) ×2 IMPLANT
DURAPREP 26ML APPLICATOR (WOUND CARE) ×2 IMPLANT
ELECT REM PT RETURN 9FT ADLT (ELECTROSURGICAL) ×2
ELECTRODE REM PT RTRN 9FT ADLT (ELECTROSURGICAL) ×1 IMPLANT
EVACUATOR 1/8 PVC DRAIN (DRAIN) ×2 IMPLANT
GAUZE XEROFORM 1X8 LF (GAUZE/BANDAGES/DRESSINGS) ×2 IMPLANT
GLOVE BIO SURGEON STRL SZ7.5 (GLOVE) ×2 IMPLANT
GLOVE BIO SURGEON STRL SZ8.5 (GLOVE) ×4 IMPLANT
GLOVE BIOGEL PI IND STRL 8 (GLOVE) ×2 IMPLANT
GLOVE BIOGEL PI IND STRL 9 (GLOVE) ×1 IMPLANT
GLOVE BIOGEL PI INDICATOR 8 (GLOVE) ×2
GLOVE BIOGEL PI INDICATOR 9 (GLOVE) ×1
GOWN PREVENTION PLUS XLARGE (GOWN DISPOSABLE) ×2 IMPLANT
GOWN STRL NON-REIN LRG LVL3 (GOWN DISPOSABLE) ×2 IMPLANT
GOWN STRL REIN XL XLG (GOWN DISPOSABLE) ×4 IMPLANT
HANDPIECE INTERPULSE COAX TIP (DISPOSABLE) ×2
HOOD PEEL AWAY FACE SHEILD DIS (HOOD) ×6 IMPLANT
KIT BASIN OR (CUSTOM PROCEDURE TRAY) ×2 IMPLANT
KIT ROOM TURNOVER OR (KITS) ×2 IMPLANT
MANIFOLD NEPTUNE II (INSTRUMENTS) ×2 IMPLANT
NDL SAFETY ECLIPSE 18X1.5 (NEEDLE) IMPLANT
NDL SPNL 18GX3.5 QUINCKE PK (NEEDLE) IMPLANT
NEEDLE HYPO 18GX1.5 SHARP (NEEDLE)
NEEDLE SPNL 18GX3.5 QUINCKE PK (NEEDLE) IMPLANT
NS IRRIG 1000ML POUR BTL (IV SOLUTION) ×2 IMPLANT
PACK TOTAL JOINT (CUSTOM PROCEDURE TRAY) ×2 IMPLANT
PAD ARMBOARD 7.5X6 YLW CONV (MISCELLANEOUS) ×4 IMPLANT
PADDING CAST ABS 6INX4YD NS (CAST SUPPLIES) ×1
PADDING CAST ABS COTTON 6X4 NS (CAST SUPPLIES) IMPLANT
PADDING CAST COTTON 6X4 STRL (CAST SUPPLIES) ×2 IMPLANT
SET HNDPC FAN SPRY TIP SCT (DISPOSABLE) ×1 IMPLANT
SPONGE GAUZE 4X4 12PLY (GAUZE/BANDAGES/DRESSINGS) ×3 IMPLANT
STAPLER VISISTAT 35W (STAPLE) ×2 IMPLANT
SUCTION FRAZIER TIP 10 FR DISP (SUCTIONS) ×2 IMPLANT
SUT VIC AB 0 CTX 36 (SUTURE) ×2
SUT VIC AB 0 CTX36XBRD ANTBCTR (SUTURE) ×1 IMPLANT
SUT VIC AB 1 CTX 36 (SUTURE) ×2
SUT VIC AB 1 CTX36XBRD ANBCTR (SUTURE) ×1 IMPLANT
SUT VIC AB 2-0 CT1 27 (SUTURE) ×2
SUT VIC AB 2-0 CT1 TAPERPNT 27 (SUTURE) ×1 IMPLANT
SYR 50ML LL SCALE MARK (SYRINGE) ×2 IMPLANT
TOWEL OR 17X24 6PK STRL BLUE (TOWEL DISPOSABLE) ×2 IMPLANT
TOWEL OR 17X26 10 PK STRL BLUE (TOWEL DISPOSABLE) ×2 IMPLANT
TRAY FOLEY CATH 14FR (SET/KITS/TRAYS/PACK) IMPLANT
WATER STERILE IRR 1000ML POUR (IV SOLUTION) ×4 IMPLANT

## 2013-01-22 NOTE — Evaluation (Signed)
Physical Therapy Evaluation Patient Details Name: Katherine Mcpherson MRN: [id number] DOB: February 02, 1946 Today's Date: 01/22/2013 Time: 4098-1191 PT Time Calculation (min): 17 min  PT Assessment / Plan / Recommendation History of Present Illness  Patient is a 67 yo female s/p Lt TKA.  Patient had Rt TKA in May 2014.  Clinical Impression  Patient presents with problems listed below.  Will benefit from acute PT to maximize independence prior to discharge home with sister's help.    PT Assessment  Patient needs continued PT services    Follow Up Recommendations  Home health PT;Supervision/Assistance - 24 hour    Does the patient have the potential to tolerate intense rehabilitation      Barriers to Discharge Decreased caregiver support Patient lives alone.  Sister will be staying with patient 24/7.    Equipment Recommendations  None recommended by PT    Recommendations for Other Services     Frequency 7X/week    Precautions / Restrictions Precautions Precautions: Knee Precaution Booklet Issued: Yes (comment) Precaution Comments: Reviewed precautions with patient. Restrictions Weight Bearing Restrictions: Yes LLE Weight Bearing: Weight bearing as tolerated   Pertinent Vitals/Pain Pain 6/10 limiting mobility      Mobility  Bed Mobility Bed Mobility: Supine to Sit;Sitting - Scoot to Edge of Bed Supine to Sit: 4: Min assist;HOB flat Sitting - Scoot to [employer] of Bed: 4: Min guard Details for Bed Mobility Assistance: Verbal cues for technique.  Assist to move LLE off of bed.  Good balance in sitting. Transfers Transfers: Sit to Stand;Stand to [employer] Transfers Sit to Stand: 4: Min assist;With upper extremity assist;From elevated surface;From bed Stand to Sit: 4: Min assist;With upper extremity assist;With armrests;To chair/3-in-1 Stand Pivot Transfers: 4: Min assist Details for Transfer Assistance: Verbal cues for hand placement.  Assist to rise to standing and for  balance initially.  Patient able to take several steps to pivot to chair. Ambulation/Gait Ambulation/Gait Assistance: Not tested (comment)    Exercises Total Joint Exercises Ankle Circles/Pumps: AROM;Both;10 reps;Seated Quad Sets: AROM;Left;10 reps;Seated   PT Diagnosis: Difficulty walking;Generalized weakness;Acute pain  PT Problem List: Decreased strength;Decreased range of motion;Decreased activity tolerance;Decreased balance;Decreased mobility;Decreased knowledge of use of DME;Decreased knowledge of precautions;Pain PT Treatment Interventions: DME instruction;Gait training;Stair training;Functional mobility training;Therapeutic exercise;Patient/family education     PT Goals(Current goals can be found in the care plan section) Acute Rehab PT Goals Patient Stated Goal: To be as independent as possible before going home. PT Goal Formulation: With patient Time For Goal Achievement: 01/29/13 Potential to Achieve Goals: Good  Visit Information  Last PT Received On: 01/22/13 Assistance Needed: +1 History of Present Illness: Patient is a 67 yo female s/p Lt TKA.  Patient had Rt TKA in May 2014.       Prior Functioning  Home Living Family/patient expects to be discharged to:: Private residence Living Arrangements: Alone Available Help at Discharge: Family;Available 24 hours/day (Sister staying with her) Type of Home: House Home Access: Stairs to enter [employer] of Steps: 4 Entrance Stairs-Rails: Right;Left Home Layout: One level Home Equipment: Environmental consultant - 2 wheels;Cane - single point;Bedside commode Prior Function Level of Independence: Independent Communication Communication: No difficulties Dominant Hand: Right    Cognition  Cognition Arousal/Alertness: Awake/alert Behavior During Therapy: WFL for tasks assessed/performed Overall Cognitive Status: Within Functional Limits for tasks assessed    Extremity/Trunk Assessment Upper Extremity Assessment Upper  Extremity Assessment: Overall WFL for tasks assessed Lower Extremity Assessment Lower Extremity Assessment: LLE deficits/detail LLE Deficits / Details: Decreased  strength and ROM due to surgery/pain.  Able to assist with moving LLE off of bed. LLE: Unable to fully assess due to pain Cervical / Trunk Assessment Cervical / Trunk Assessment: Normal (Had spinal surgery in past)   Balance Balance Balance Assessed: Yes Static Sitting Balance Static Sitting - Balance Support: No upper extremity supported;Feet supported Static Sitting - Level of Assistance: 5: Stand by assistance Static Sitting - Comment/# of Minutes: 6 minutes with good balance Static Standing Balance Static Standing - Balance Support: Bilateral upper extremity supported Static Standing - Level of Assistance: 4: Min assist Static Standing - Comment/# of Minutes: 2  End of Session PT - End of Session Equipment Utilized During Treatment: Gait belt Activity Tolerance: Patient limited by pain Patient left: in chair;with call bell/phone within reach Nurse Communication: Mobility status CPM Left Knee CPM Left Knee: Off  GP     Vena Austria 01/22/2013, 4:22 PM Durenda Hurt. Renaldo Fiddler, [facility] Acute Rehab Services Pager [phone]

## 2013-01-22 NOTE — Anesthesia Preprocedure Evaluation (Signed)
Anesthesia Evaluation  Patient identified by MRN, date of birth, ID band Patient awake    Reviewed: Allergy & Precautions, H&P , NPO status , Patient's Chart, lab work & pertinent test results  History of Anesthesia Complications (+) PONV and history of anesthetic complications  Airway Mallampati: II TM Distance: >3 FB Neck ROM: Full    Dental  (+) Teeth Intact and Dental Advisory Given   Pulmonary  breath sounds clear to auscultation        Cardiovascular hypertension, Pt. on medications Rhythm:Irregular Rate:Normal     Neuro/Psych    GI/Hepatic hiatal hernia, GERD-  Medicated and Controlled,  Endo/Other    Renal/GU      Musculoskeletal   Abdominal (+) + obese,   Peds  Hematology   Anesthesia Other Findings   Reproductive/Obstetrics                           Anesthesia Physical Anesthesia Plan  ASA: II  Anesthesia Plan: General   Post-op Pain Management:    Induction: Intravenous  Airway Management Planned: LMA C-Track Planned  Additional Equipment:   Intra-op Plan:   Post-operative Plan: Extubation in OR  Informed Consent: I have reviewed the patients History and Physical, chart, labs and discussed the procedure including the risks, benefits and alternatives for the proposed anesthesia with the patient or authorized representative who has indicated his/her understanding and acceptance.     Plan Discussed with: CRNA and Surgeon  Anesthesia Plan Comments:         Anesthesia Quick Evaluation

## 2013-01-22 NOTE — Progress Notes (Signed)
Orthopedic Tech Progress Note Patient Details:  Katherine Mcpherson Aug 01, 1945 [id number]  CPM Left Knee CPM Left Knee: On Left Knee Flexion (Degrees): 60 Left Knee Extension (Degrees): 0 Additional Comments: Trapeze bar   Shawnie Pons 01/22/2013, 10:45 AM

## 2013-01-22 NOTE — Interval H&P Note (Signed)
History and Physical Interval Note:  01/22/2013 7:10 AM  Katherine Mcpherson  has presented today for surgery, with the diagnosis of LEFT KNEE OSTEOARTHRITIS   The various methods of treatment have been discussed with the patient and family. After consideration of risks, benefits and other options for treatment, the patient has consented to  Procedure(s): LEFT TOTAL KNEE ARTHROPLASTY (Left) as a surgical intervention .  The patient's history has been reviewed, patient examined, no change in status, stable for surgery.  I have reviewed the patient's chart and labs.  Questions were answered to the patient's satisfaction.     [name]

## 2013-01-22 NOTE — Op Note (Signed)
PATIENT ID:      Katherine Mcpherson  MRN:     [id number] DOB/AGE:    69-Jan-1947 / 69 y.o.       OPERATIVE REPORT    DATE OF PROCEDURE:  01/22/2013       PREOPERATIVE DIAGNOSIS:   LEFT KNEE OSTEOARTHRITIS       Estimated body mass index is 32.43 kg/(m^2) as calculated from the following:   Height as of 01/12/13: 5' 8.5" (1.74 m).   Weight as of 07/24/12: 98.2 kg (216 lb 7.9 oz).                                                        POSTOPERATIVE DIAGNOSIS:   LEFT KNEE OSTEOARTHRITIS                                                                       PROCEDURE:  Procedure(s): LEFT TOTAL KNEE ARTHROPLASTY Using Depuy Sigma RP implants #4L Femur, #5Tibia, 10mm sigma RP bearing, 41 Patella     SURGEON: Kashonda Sarkisyan J    ASSISTANT:   Eric K. [employer]   (Present and scrubbed throughout the case, critical for assistance with exposure, retraction, instrumentation, and closure.)         ANESTHESIA: GET Exparel  DRAINS: foley, 2 medium hemovac in knee   TOURNIQUET TIME:   COMPLICATIONS:  None     SPECIMENS: None   INDICATIONS FOR PROCEDURE: The patient has  LEFT KNEE OSTEOARTHRITIS , varus deformities, XR shows bone on bone arthritis. Patient has failed all conservative measures including anti-inflammatory medicines, narcotics, attempts at  exercise and weight loss, cortisone injections and viscosupplementation.  Risks and benefits of surgery have been discussed, questions answered.   DESCRIPTION OF PROCEDURE: The patient identified by armband, received  IV antibiotics, in the holding area at [facility]. Patient taken to the operating room, appropriate anesthetic  monitors were attached, and general endotracheal anesthesia induced with  the patient in supine position, Foley catheter was inserted. Tourniquet  applied high to the operative thigh. Lateral post and foot positioner  applied to the table, the lower extremity was then prepped and draped  in usual sterile fashion  from the ankle to the tourniquet. Time-out procedure was performed. The limb was wrapped with an Esmarch bandage and the tourniquet inflated to 350 mmHg. We began the operation by making the anterior midline incision starting at handbreadth above the patella going over the patella 1 cm medial to and  4 cm distal to the tibial tubercle. Small bleeders in the skin and the  subcutaneous tissue identified and cauterized. Transverse retinaculum was incised and reflected medially and a medial parapatellar arthrotomy was accomplished. the patella was everted and theprepatellar fat pad resected. The superficial medial collateral  ligament was then elevated from anterior to posterior along the proximal  flare of the tibia and anterior half of the menisci resected. The knee was hyperflexed exposing bone on bone arthritis. Peripheral and notch osteophytes as well as the cruciate ligaments were then resected. We continued to  work  our way around posteriorly along the proximal tibia, and externally  rotated the tibia subluxing it out from underneath the femur. A McHale  retractor was placed through the notch and a lateral Hohmann retractor  placed, and we then drilled through the proximal tibia in line with the  axis of the tibia followed by an intramedullary guide rod and 2-degree  posterior slope cutting guide. The tibial cutting guide was pinned into place  allowing resection of 8 mm of bone medially and about 1 mm of bone  laterally because of her varus deformity. Satisfied with the tibial resection, we then  entered the distal femur 2 mm anterior to the PCL origin with the  intramedullary guide rod and applied the distal femoral cutting guide  set at 11mm, with 5 degrees of valgus. This was pinned along the  epicondylar axis. At this point, the distal femoral cut was accomplished without difficulty. We then sized for a #4L femoral component and pinned the guide in 0 degrees of external rotation.The chamfer  cutting guide was pinned into place. The anterior, posterior, and chamfer cuts were accomplished without difficulty followed by  the Sigma RP box cutting guide and the box cut. We also removed posterior osteophytes from the posterior femoral condyles. At this  time, the knee was brought into full extension. We checked our  extension and flexion gaps and found them symmetric at 10mm.  The patella thickness measured at 25 mm. We set the cutting guide at 15 and removed the posterior 10 mm  of the patella, sized for a 41 button and drilled the lollipop. The knee  was then once again hyperflexed exposing the proximal tibia. We sized for a #5 tibial base plate, applied the smokestack and the conical reamer followed by the the Delta fin keel punch. We then hammered into place the Sigma RP trial femoral component, inserted a 10-mm trial bearing, trial patellar button, and took the knee through range of motion from 0-130 degrees. No thumb pressure was required for patellar  tracking. At this point, all trial components were removed, a double batch of DePuy HV cement with 1500 mg of Zinacef was mixed and applied to all bony metallic mating surfaces except for the posterior condyles of the femur itself. In order, we  hammered into place the tibial tray and removed excess cement, the femoral component and removed excess cement, a 10-mm Sigma RP bearing  was inserted, and the knee brought to full extension with compression.  The patellar button was clamped into place, and excess cement  removed. While the cement cured the wound was irrigated out with normal saline solution pulse lavage, and medium Hemovac drains were placed from an anterolateral  approach. Ligament stability and patellar tracking were checked and found to be excellent. The parapatellar arthrotomy was closed with  running #1 Vicryl suture. The subcutaneous tissue with 0 and 2-0 undyed  Vicryl suture, and the skin with skin staples. A dressing of  Xeroform,  4 x 4, dressing sponges, Webril, and Ace wrap applied. The patient  awakened, extubated, and taken to recovery room without difficulty.   Murvin Gift J 01/22/2013, 9:19 AM

## 2013-01-22 NOTE — Anesthesia Postprocedure Evaluation (Signed)
  Anesthesia Post-op Note  Patient: Katherine Mcpherson  Procedure(s) Performed: Procedure(s): LEFT TOTAL KNEE ARTHROPLASTY (Left)  Patient Location: PACU  Anesthesia Type:GA combined with regional for post-op pain  Level of Consciousness: awake and alert   Airway and Oxygen Therapy: Patient Spontanous Breathing  Post-op Pain: mild  Post-op Assessment: Post-op Vital signs reviewed  Post-op Vital Signs: stable  Complications: No apparent anesthesia complications

## 2013-01-22 NOTE — Anesthesia Procedure Notes (Addendum)
Anesthesia Regional Block:  Femoral nerve block  Pre-Anesthetic Checklist: ,, timeout performed, Correct Patient, Correct Site, Correct Laterality, Correct Procedure, Correct Position, site marked, Risks and benefits discussed, at surgeon's request and post-op pain management  Laterality: Left and Upper  Prep: chloraprep       Needles:  Injection technique: Single-shot  Needle Type: Echogenic Needle      Needle Gauge: 22 and 22 G  Needle insertion depth: 6 cm   Additional Needles:  Procedures: ultrasound guided (picture in chart) and nerve stimulator Femoral nerve block  Nerve Stimulator or Paresthesia:  Response: Twitch elicited, 0.8 mA,   Additional Responses:   Narrative:  Start time: 01/22/2013 7:05 AM End time: 01/22/2013 7:20 AM Injection made incrementally with aspirations every 5 mL.  Performed by: Personally  Anesthesiologist: Alma Friendly, MD  Additional Notes: BP cuff, EKG monitors applied. Sedation begun. Femoral artery palpated for location of nerve. After nerve location anesthetic injected incrementally, slowly , and after neg aspirations. Tolerated well.  Femoral nerve block Procedure Name: LMA Insertion Date/Time: 01/22/2013 7:45 AM Performed by: Margaree Mackintosh Pre-anesthesia Checklist: Patient identified, Timeout performed, Emergency Drugs available, Suction available and Patient being monitored Patient Re-evaluated:Patient Re-evaluated prior to inductionOxygen Delivery Method: Circle system utilized Preoxygenation: Pre-oxygenation with 100% oxygen Intubation Type: IV induction LMA: LMA inserted LMA Size: 5.0 Number of attempts: 1 Placement Confirmation: positive ETCO2 and breath sounds checked- equal and bilateral Tube secured with: Tape Dental Injury: Teeth and Oropharynx as per pre-operative assessment

## 2013-01-22 NOTE — Preoperative (Signed)
Beta Blockers   Reason not to administer Beta Blockers:Not Applicable 

## 2013-01-22 NOTE — Transfer of Care (Signed)
Immediate Anesthesia Transfer of Care Note  Patient: Katherine Mcpherson  Procedure(s) Performed: Procedure(s): LEFT TOTAL KNEE ARTHROPLASTY (Left)  Patient Location: PACU  Anesthesia Type:General  Level of Consciousness: awake, alert  and oriented  Airway & Oxygen Therapy: Patient Spontanous Breathing and Patient connected to nasal cannula oxygen  Post-op Assessment: Report given to PACU RN, Post -op Vital signs reviewed and stable and Patient moving all extremities X 4  Post vital signs: Reviewed and stable  Complications: No apparent anesthesia complications

## 2013-01-22 NOTE — Progress Notes (Signed)
UR COMPLETED  

## 2013-01-23 LAB — CBC
MCH: 29.9 pg (ref 26.0–34.0)
MCV: 91.6 fL (ref 78.0–100.0)
Platelets: 245 10*3/uL (ref 150–400)
RBC: 3.34 MIL/uL — ABNORMAL LOW (ref 3.87–5.11)
RDW: 13.8 % (ref 11.5–15.5)
WBC: 9.4 10*3/uL (ref 4.0–10.5)

## 2013-01-23 NOTE — Progress Notes (Signed)
Patient ID: SINDIA KOWALCZYK, female   DOB: Aug 01, 1945, 67 y.o.   MRN: [id number] PATIENT ID: MIKERIA VALIN  MRN: [id number]  DOB/AGE:  13-Mar-1946 / 67 y.o.  1 Day Post-Op Procedure(s) (LRB): LEFT TOTAL KNEE ARTHROPLASTY (Left)    PROGRESS NOTE Subjective: Patient is alert, oriented, no Nausea, no Vomiting, yes passing gas, no Bowel Movement. Taking PO well. Denies SOB, Chest or Calf Pain. Using Incentive Spirometer, PAS in place. Ambulate WBAT in room, CPM 0-60 noproblem Patient reports pain as 6 on 0-10 scale  .    Objective: Vital signs in last 24 hours: Filed Vitals:   01/22/13 1800 01/22/13 2038 01/23/13 0143 01/23/13 0539  BP: 112/43 116/52 128/57 118/54  Pulse: 74 76 85 75  Temp: 99.1 F (37.3 C) 98.7 F (37.1 C) 98.4 F (36.9 C) 98.8 F (37.1 C)  TempSrc: Oral Oral Oral Oral  Resp: 18 18 18 18   SpO2: 95% 97% 98% 98%      Intake/Output from previous day: I/O last 3 completed shifts: In: 2480 [P.O.:380; I.V.:2100] Out: 675 [Urine:575; Drains:50; Blood:50]   Intake/Output this shift: Total I/O In: -  Out: 2290 [Urine:2250; Drains:40]   LABORATORY DATA:  Recent Labs  01/23/13 0507  WBC 9.4  HGB 10.0*  HCT 30.6*  PLT 245    Examination: Neurologically intact ABD soft Neurovascular intact Sensation intact distally Intact pulses distally Dorsiflexion/Plantar flexion intact Incision: no drainage No cellulitis present Compartment soft} Blood and plasma separated in drain indicating minimal recent drainage, drain pulled without difficulty.  Assessment:   1 Day Post-Op Procedure(s) (LRB): LEFT TOTAL KNEE ARTHROPLASTY (Left) ADDITIONAL DIAGNOSIS:  Hypertension  Plan: PT/OT WBAT, CPM 5/hrs day until ROM 0-90 degrees, then D/C CPM DVT Prophylaxis:  SCDx72hrs, ASA 325 mg BID x 2 weeks DISCHARGE PLAN: Home DISCHARGE NEEDS: HHPT, HHRN, CPM, Walker and 3-in-1 comode seat     Chriss Redel J 01/23/2013, 6:59 AM

## 2013-01-23 NOTE — Progress Notes (Signed)
01/23/13 Set up with HHPT, HHOT, and HHRN with Genevieve Norlander by MD office. Spoke with patient, no change in discharge plan. [employer] providing CPM, patient has rolling walker and 3N1. No other discharge needs identified.Tessie Eke RN, BSN, CCM

## 2013-01-23 NOTE — Progress Notes (Signed)
Reviewed and agree;  Keslyn Teater, PT [phone]  

## 2013-01-23 NOTE — Evaluation (Signed)
Occupational Therapy Evaluation Patient Details Name: Katherine Mcpherson MRN: [id number] DOB: 08-Aug-1945 Today's Date: 01/23/2013 Time: 4098-1191 OT Time Calculation (min): 19 min  OT Assessment / Plan / Recommendation History of present illness Patient is a 67 yo female s/p Lt TKA.  Patient had Rt TKA in May 2014.   Clinical Impression   Pt demos decline in function with ADLs and ADL mobility safety and would benefit from acute OT services to address impairments to help restore PLOF to return home safely    OT Assessment  Patient needs continued OT Services    Follow Up Recommendations  Supervision/Assistance - 24 hour;Home health OT    Barriers to Discharge   None. pt's sister will be coming to stay with her when she returns home  Equipment Recommendations  Tub/shower seat    Recommendations for Other Services    Frequency  Min 2X/week    Precautions / Restrictions Precautions Precautions: Knee Restrictions Weight Bearing Restrictions: Yes LLE Weight Bearing: Weight bearing as tolerated   Pertinent Vitals/Pain 7/10    ADL  Grooming: Performed;Wash/dry hands;Wash/dry face;Min guard Where Assessed - Grooming: Supported standing Upper Body Bathing: Simulated;Supervision/safety;Set up Lower Body Bathing: Moderate assistance;Simulated Upper Body Dressing: Performed;Supervision/safety;Set up Lower Body Dressing: Maximal assistance;Performed Toilet Transfer: Simulated;Minimal assistance Toilet Transfer Method: Sit to stand Toileting - Clothing Manipulation and Hygiene: Moderate assistance Where Assessed - Toileting Clothing Manipulation and Hygiene: Standing Tub/Shower Transfer Method: Not assessed Equipment Used: Gait belt;Rolling walker Transfers/Ambulation Related to ADLs: cues for correct hand placement, safety ADL Comments: pt familiar with ADL A/E from previous knee suregery, has reacher at home    OT Diagnosis: Acute pain  OT Problem List: Decreased knowledge of  use of DME or AE;Impaired balance (sitting and/or standing);Pain;Decreased activity tolerance OT Treatment Interventions: Self-care/ADL training;Therapeutic exercise;Balance training;DME and/or AE instruction;Patient/family education;Neuromuscular education;Therapeutic activities   OT Goals(Current goals can be found in the care plan section) Acute Rehab OT Goals Patient Stated Goal: To get back to yard work OT Goal Formulation: With patient Time For Goal Achievement: 01/30/13 Potential to Achieve Goals: Good ADL Goals Pt Will Perform Grooming: with set-up;with supervision;with min guard assist;standing (at sink) Pt Will Perform Lower Body Bathing: with min assist Pt Will Perform Lower Body Dressing: with mod assist Pt Will Transfer to Toilet: with min guard assist;ambulating;regular height toilet (3 in 1 over toilet) Pt Will Perform Toileting - Clothing Manipulation and hygiene: sit to/from stand;with min assist Pt Will Perform Tub/Shower Transfer: with min assist;with min guard assist;shower seat;ambulating  Visit Information  Last OT Received On: 01/23/13 Assistance Needed: +1 History of Present Illness: Patient is a 67 yo female s/p Lt TKA.  Patient had Rt TKA in May 2014.       Prior Functioning     Home Living Family/patient expects to be discharged to:: Private residence Living Arrangements: Alone Available Help at Discharge: Family;Available 24 hours/day Type of Home: House Home Access: Stairs to enter [employer] of Steps: 4 Entrance Stairs-Rails: Right;Left Home Layout: One level Home Equipment: Walker - 2 wheels;Cane - single point;Bedside commode;Adaptive equipment Adaptive Equipment: Reacher Additional Comments: pt familiar wit ADL A/E from previous knee surgery Prior Function Level of Independence: Independent Communication Communication: No difficulties Dominant Hand: Right         Vision/Perception Vision - History Baseline Vision: No  visual deficits Patient Visual Report: No change from baseline Perception Perception: Within Functional Limits   Cognition  Cognition Arousal/Alertness: Awake/alert Behavior During Therapy: WFL for tasks assessed/performed Overall  Cognitive Status: Within Functional Limits for tasks assessed    Extremity/Trunk Assessment Upper Extremity Assessment Upper Extremity Assessment: Overall WFL for tasks assessed Lower Extremity Assessment Lower Extremity Assessment: Defer to PT evaluation Cervical / Trunk Assessment Cervical / Trunk Assessment: Normal     Mobility Bed Mobility Bed Mobility: Not assessed Details for Bed Mobility Assistance: pt up in recliner Transfers Transfers: Sit to Stand;Stand to Sit Sit to Stand: 4: Min assist;With upper extremity assist;From chair/3-in-1;With armrests Stand to Sit: 4: Min assist;To chair/3-in-1;With armrests;With upper extremity assist Details for Transfer Assistance: Verbal cues for hand placement. Assist to rise to standing and for balance initially.        Balance Balance Balance Assessed: Yes Dynamic Sitting Balance Dynamic Sitting - Balance Support: No upper extremity supported;Feet supported;During functional activity Dynamic Sitting - Level of Assistance: 5: Stand by assistance Static Standing Balance Static Standing - Balance Support: Bilateral upper extremity supported Static Standing - Level of Assistance: 5: Stand by assistance Static Standing - Comment/# of Minutes: 2 Dynamic Standing Balance Dynamic Standing - Balance Support: Left upper extremity supported;During functional activity Dynamic Standing - Level of Assistance: 4: Min assist   End of Session OT - End of Session Equipment Utilized During Treatment: Gait belt;Rolling walker Activity Tolerance: Patient limited by fatigue;Patient limited by pain Patient left: in chair;with call bell/phone within reach CPM Left Knee CPM Left Knee: Off  GO     Margaretmary Eddy  [facility] 01/23/2013, 1:31 PM

## 2013-01-23 NOTE — Progress Notes (Signed)
Physical Therapy Treatment Patient Details Name: MARVELOUS WOOLFORD MRN: [id number] DOB: 12-16-1945 Today's Date: 01/23/2013 Time: 4098-1191 PT Time Calculation (min): 29 min  PT Assessment / Plan / Recommendation  History of Present Illness Patient is a 67 yo female s/p Lt TKA.  Patient had Rt TKA in May 2014.   PT Comments   Pt is progressing toward increased strength and mobility of L LE. Showed good activation of quads and L knee stabilization when walking in a step-to pattern. Pt walked 40 ft but was limited by fatigue. She reported her pain as a 7/10 after exercise. She requested pain medication but had received her meds 15 minutes prior to session. Plan on progressing toward increasing her ambulation distance and ability to safely use the stairs so she can d/c home.  Follow Up Recommendations  Home health PT     Does the patient have the potential to tolerate intense rehabilitation     Barriers to Discharge        Equipment Recommendations  None recommended by PT    Recommendations for Other Services    Frequency 7X/week   Progress towards PT Goals Progress towards PT goals: Progressing toward goals  Plan Current plan remains appropriate    Precautions / Restrictions Precautions Precautions: Knee Restrictions Weight Bearing Restrictions: Yes LLE Weight Bearing: Weight bearing as tolerated   Pertinent Vitals/Pain Pain was 7/10 upon completion of exercises. Pt. Requested pain meds but had already received them.    Mobility  Bed Mobility Bed Mobility: Not assessed Transfers Transfers: Sit to Stand;Stand to Sit Sit to Stand: 4: Min assist;With upper extremity assist;From chair/3-in-1;With armrests Stand to Sit: 4: Min assist;To chair/3-in-1;With armrests;With upper extremity assist Stand Pivot Transfers: Not tested (comment) Details for Transfer Assistance: Verbal cues for hand placement. Assist to rise to standing and for balance  initially. Ambulation/Gait Ambulation/Gait Assistance: 4: Min guard Ambulation Distance (Feet): 40 Feet Assistive device: Rolling walker Gait Pattern: Step-to pattern;Decreased step length - right;Decreased step length - left Gait velocity: less than 0.6 m/s (slow) General Gait Details: Showed good stabilization on L LE. Limited by fatigue.    Exercises Total Joint Exercises Quad Sets: AROM;Strengthening;Left;10 reps;Seated Heel Slides: AAROM;Strengthening;Left;Other reps (comment);Seated (8 reps) Long Arc Quad: AAROM;Strengthening;Left;10 reps;Seated (2 sets of 5 reps)   PT Diagnosis:    PT Problem List:   PT Treatment Interventions:     PT Goals (current goals can now be found in the care plan section) Acute Rehab PT Goals Patient Stated Goal: To get back to yard work PT Goal Formulation: With patient Time For Goal Achievement: 01/29/13 Potential to Achieve Goals: Good  Visit Information  Last PT Received On: 01/23/13 Assistance Needed: +1 History of Present Illness: Patient is a 67 yo female s/p Lt TKA.  Patient had Rt TKA in May 2014.    Subjective Data  Patient Stated Goal: To get back to yard work   Cognition  Cognition Arousal/Alertness: Awake/alert Behavior During Therapy: WFL for tasks assessed/performed Overall Cognitive Status: Within Functional Limits for tasks assessed    Balance  Balance Balance Assessed: Yes Static Standing Balance Static Standing - Balance Support: Bilateral upper extremity supported Static Standing - Level of Assistance: 5: Stand by assistance Static Standing - Comment/# of Minutes: 2  End of Session PT - End of Session Equipment Utilized During Treatment: Gait belt Activity Tolerance: Patient limited by fatigue;Patient tolerated treatment well Patient left: in chair;with call bell/phone within reach Nurse Communication: Patient requests pain meds (had already  received meds) CPM Left Knee CPM Left Knee: Off Left Knee Flexion  (Degrees): 60 Left Knee Extension (Degrees): 0   GP     [name] 01/23/2013, 11:53 AM Heloise Ochoa, SPT

## 2013-01-23 NOTE — Progress Notes (Addendum)
Physical Therapy Treatment Patient Details Name: Katherine Mcpherson MRN: [id number] DOB: [birth year] Today's Date: 01/23/2013 Time: 4098-1191 PT Time Calculation (min): 25 min  PT Assessment / Plan / Recommendation  History of Present Illness Patient is a 67 yo female s/p Lt TKA.  Patient had Rt TKA in May 2014.   PT Comments   Pt. is progressing well. L knee stable while ambulating and doing stairs. Limited by some UE fatigue, but improved from am session. Should continue to work toward increasing her L LE ROM.  Follow Up Recommendations  Home health PT     Does the patient have the potential to tolerate intense rehabilitation     Barriers to Discharge        Equipment Recommendations  None recommended by PT    Recommendations for Other Services    Frequency 7X/week   Progress towards PT Goals Progress towards PT goals: Progressing toward goals  Plan Current plan remains appropriate    Precautions / Restrictions Precautions Precautions: Knee Restrictions Weight Bearing Restrictions: Yes LLE Weight Bearing: Weight bearing as tolerated   Pertinent Vitals/Pain Reported a 7/10 upon first attempt to treat. Came back 45 minutes later and she was down to a 6/10 and able to participate in PT.    Mobility  Bed Mobility Bed Mobility: Not assessed Sitting - Scoot to Edge of Bed: 5: Supervision Details for Bed Mobility Assistance: pt up in recliner Transfers Transfers: Sit to Stand;Stand to Sit Sit to Stand: 4: Min guard Stand to Sit: 4: Min guard Stand Pivot Transfers: Not tested (comment) Details for Transfer Assistance: Verbal cues for hand placement. With fatigue, pt tended to sit more impulsively with sub-optimal hand placement. Ambulation/Gait Ambulation/Gait Assistance: 4: Min guard Ambulation Distance (Feet): 105 Feet Assistive device: Rolling walker Gait Pattern: Step-to pattern;Step-through pattern;Decreased step length - right;Decreased step length - left (step-to  initially; some step-through when cued) Gait velocity:  (faster than the morning but still slow) General Gait Details: Showed good stabilization on L LE. Limited by fatigue. Stairs: Yes Stair Management Technique: One rail Left;Step to pattern;Forwards Number of Stairs: 3 (up 3, down 3) cues to try to approximate home situation. Patient is confident and it was clear she has done this before.    Exercises     PT Diagnosis:    PT Problem List:   PT Treatment Interventions:     PT Goals (current goals can now be found in the care plan section) Acute Rehab PT Goals Patient Stated Goal: To be as independent as possible before going home. PT Goal Formulation: With patient Time For Goal Achievement: 01/29/13 Potential to Achieve Goals: Good  Visit Information  Last PT Received On: 01/23/13 Assistance Needed: +1 History of Present Illness: Patient is a 67 yo female s/p Lt TKA.  Patient had Rt TKA in May 2014.    Subjective Data  Patient Stated Goal: To be as independent as possible before going home.   Cognition  Cognition Arousal/Alertness: Awake/alert Behavior During Therapy: WFL for tasks assessed/performed Overall Cognitive Status: Within Functional Limits for tasks assessed    Balance  Balance Balance Assessed: Yes (stopped and shook out hands during ambulation with stability) Dynamic Sitting Balance Dynamic Sitting - Balance Support: No upper extremity supported;Feet supported;During functional activity Dynamic Sitting - Level of Assistance: 5: Stand by assistance Static Standing Balance Static Standing - Balance Support: Bilateral upper extremity supported Static Standing - Level of Assistance: 5: Stand by assistance Dynamic Standing Balance Dynamic Standing - Balance  Support: Left upper extremity supported;During functional activity Dynamic Standing - Level of Assistance: 4: Min assist  End of Session PT - End of Session Equipment Utilized During Treatment: Gait  belt Activity Tolerance: Patient tolerated treatment well Patient left: in CPM;with call bell/phone within reach;in bed CPM Left Knee CPM Left Knee: On Left Knee Flexion (Degrees): 60 Left Knee Extension (Degrees): 0   GP     [name] 01/23/2013, 4:40 PM [place], [place] [phone]

## 2013-01-24 ENCOUNTER — Encounter (HOSPITAL_COMMUNITY): Payer: Self-pay | Admitting: Orthopedic Surgery

## 2013-01-24 LAB — CBC
HCT: 28.8 % — ABNORMAL LOW (ref 36.0–46.0)
MCHC: 33 g/dL (ref 30.0–36.0)
MCV: 90.3 fL (ref 78.0–100.0)
RDW: 13.6 % (ref 11.5–15.5)
WBC: 9.1 10*3/uL (ref 4.0–10.5)

## 2013-01-24 NOTE — Progress Notes (Signed)
PATIENT ID: Katherine Mcpherson  MRN: [id number]  DOB/AGE:  69-Jul-1947 / 69 y.o.  2 Days Post-Op Procedure(s) (LRB): LEFT TOTAL KNEE ARTHROPLASTY (Left)    PROGRESS NOTE Subjective: Patient is alert, oriented, no Nausea, no Vomiting, yes passing gas, no Bowel Movement. Taking PO well. Denies SOB, Chest or Calf Pain. Using Incentive Spirometer, PAS in place. Ambulate wbat, CPM 0-60 Patient reports pain as moderate  .    Objective: Vital signs in last 24 hours: Filed Vitals:   01/23/13 1600 01/23/13 2147 01/24/13 0209 01/24/13 0447  BP:  145/63  143/56  Pulse:  100  88  Temp:  102.6 F (39.2 C) 98 F (36.7 C) 98.1 F (36.7 C)  TempSrc:  Oral  Oral  Resp: 18 18  18   SpO2:  96%  96%      Intake/Output from previous day: I/O last 3 completed shifts: In: 1080 [P.O.:1080] Out: 2790 [Urine:2750; Drains:40]   Intake/Output this shift:     LABORATORY DATA:  Recent Labs  01/23/13 0507 01/24/13 0451  WBC 9.4 9.1  HGB 10.0* 9.5*  HCT 30.6* 28.8*  PLT 245 227    Examination: Neurologically intact ABD soft Neurovascular intact Sensation intact distally Intact pulses distally Dorsiflexion/Plantar flexion intact Incision: scant drainage No cellulitis present Compartment soft}  Assessment:   2 Days Post-Op Procedure(s) (LRB): LEFT TOTAL KNEE ARTHROPLASTY (Left) ADDITIONAL DIAGNOSIS:  Hypertension  Plan: PT/OT WBAT, CPM 5/hrs day until ROM 0-90 degrees, then D/C CPM DVT Prophylaxis:  SCDx72hrs, ASA 325 mg BID x 2 weeks DISCHARGE PLAN: Home tomorrow as she has no help at home today. DISCHARGE NEEDS: HHPT, HHRN, CPM, Walker and 3-in-1 comode seat     Sanaa Zilberman R 01/24/2013, 7:40 AM

## 2013-01-24 NOTE — Progress Notes (Signed)
Physical Therapy Treatment Patient Details Name: Katherine Mcpherson MRN: [id number] DOB: Oct 06, 1945 Today's Date: 01/24/2013 Time: 4098-1191 PT Time Calculation (min): 32 min  PT Assessment / Plan / Recommendation  History of Present Illness Patient is a 67 yo female s/p Lt TKA.  Patient had Rt TKA in May 2014.   PT Comments   Pt. continues to improve with mobility and strength. All goals have been met in order to d/c home tomorrow.  Follow Up Recommendations  Home health PT     Does the patient have the potential to tolerate intense rehabilitation     Barriers to Discharge        Equipment Recommendations  None recommended by PT    Recommendations for Other Services    Frequency 7X/week   Progress towards PT Goals Progress towards PT goals: Progressing toward goals  Plan Current plan remains appropriate    Precautions / Restrictions Precautions Precautions: Knee Restrictions Weight Bearing Restrictions: Yes LLE Weight Bearing: Weight bearing as tolerated   Pertinent Vitals/Pain Pt. reported 6/10 pain. patient repositioned for comfort in CPM.    Mobility  Bed Mobility Bed Mobility: Sit to Supine Supine to Sit: 6: Modified independent (Device/Increase time) Sitting - Scoot to Edge of Bed: 6: Modified independent (Device/Increase time) Transfers Transfers: Stand to Sit;Sit to Stand (stand to sit on bed; both on mat in ortho gym) Sit to Stand: 5: Supervision Stand to Sit: 5: Supervision Stand Pivot Transfers: Not tested (comment) Ambulation/Gait Ambulation/Gait Assistance: 5: Supervision Ambulation Distance (Feet): greater than 200 Feet Assistive device: Rolling walker Gait Pattern: Step-to pattern;Step-through pattern;Decreased step length - right;Decreased step length - left;Decreased hip/knee flexion - left;Left foot flat (knee valgus; toes pointing out slightly) Gait velocity: faster than yesterday General Gait Details:  (not limited by UE fatigue indicating more  weight on L LE)    Exercises Total Joint Exercises Short Arc Quad: 10 reps;AROM;AAROM;Strengthening;Left;Supine Straight Leg Raises: AROM;AAROM;Strengthening;Left;10 reps;Supine (reported back pain during exercises)   PT Diagnosis:    PT Problem List:   PT Treatment Interventions:     PT Goals (current goals can now be found in the care plan section) Acute Rehab PT Goals Patient Stated Goal: To be as independent as possible before going home. PT Goal Formulation: With patient Time For Goal Achievement: 01/29/13 Potential to Achieve Goals: Good  Visit Information  Last PT Received On: 01/24/13 Assistance Needed: +1 History of Present Illness: Patient is a 67 yo female s/p Lt TKA.  Patient had Rt TKA in May 2014.    Subjective Data  Patient Stated Goal: To be as independent as possible before going home.   Cognition  Cognition Arousal/Alertness: Awake/alert Behavior During Therapy: WFL for tasks assessed/performed Overall Cognitive Status: Within Functional Limits for tasks assessed    Balance  Balance Balance Assessed: Yes  End of Session PT - End of Session Activity Tolerance: Patient tolerated treatment well Patient left: in CPM;in bed;with call bell/phone within reach CPM Left Knee CPM Left Knee: On Left Knee Flexion (Degrees): 65 Left Knee Extension (Degrees): 0   GP     [name] 01/24/2013, 12:07 PM  [place], [place] [phone]

## 2013-01-24 NOTE — Progress Notes (Signed)
Occupational Therapy Treatment Patient Details Name: Katherine Mcpherson MRN: [id number] DOB: 06-17-1945 Today's Date: 01/24/2013 Time: 0945-1000 OT Time Calculation (min): 15 min  OT Assessment / Plan / Recommendation  History of present illness Patient is a 67 yo female s/p Lt TKA.  Patient had Rt TKA in May 2014.   OT comments  Pt in bed willing to work w/OT. Pt demo'd Mod (I) w/bed mob with bed flat, no rails, bed elevated to simulated ht as home. Pt able to position LLE in ER onto EOB to doff/don sock w/o assist or AE. Pt at supervision/stand by assist level for sit>stand, stand>sit and fx'l mob in room using RW. Pt stated she had already completed UB bathing & grooming tasks this AM w/set-up only. Pt agreeable for amb in hallway where we met PT. PT continued tx session w/pt.   No OT follow-up indicated as pt w/prior knee surgery, has necessary DME & AE and (I) to use. Pt w/good family support and good problem solving skills.    Follow Up Recommendations  Supervision/Assistance - 24 hour;No OT follow up    Barriers to Discharge       Equipment Recommendations       Recommendations for Other Services    Frequency Min 2X/week   Progress towards OT Goals    Plan      Precautions / Restrictions Precautions Precautions: Knee Restrictions Weight Bearing Restrictions: Yes LLE Weight Bearing: Weight bearing as tolerated   Pertinent Vitals/Pain 3/10    ADL  Lower Body Dressing: Performed;Supervision/safety Where Assessed - Lower Body Dressing: Unsupported sitting (EOB. propped LLE onto bed to don/doff sock. no assist) Toilet Transfer: Performed;Supervision/safety    OT Diagnosis:    OT Problem List:   OT Treatment Interventions:     OT Goals(current goals can now be found in the care plan section)    Visit Information  Last OT Received On: 01/24/13 Assistance Needed: +1 History of Present Illness: Patient is a 67 yo female s/p Lt TKA.  Patient had Rt TKA in May 2014.   Subjective Data      Prior Functioning       Cognition  Cognition Arousal/Alertness: Awake/alert Behavior During Therapy: WFL for tasks assessed/performed    Mobility  Bed Mobility Supine to Sit: 6: Modified independent (Device/Increase time) Sitting - Scoot to Edge of Bed: 6: Modified independent (Device/Increase time) Details for Bed Mobility Assistance:  (bed flat, elevated to simulated ht as home, no rails ) Transfers Transfers: Sit to Stand;Stand to Sit Sit to Stand: 5: Supervision Stand to Sit: 5: Supervision    Exercises      Balance     End of Session CPM Left Knee CPM Left Knee: On Left Knee Flexion (Degrees): 65 Left Knee Extension (Degrees): 0  GO     Kyrollos Cordell G 01/24/2013, 11:23 AM

## 2013-01-25 LAB — CBC
Hemoglobin: 9.3 g/dL — ABNORMAL LOW (ref 12.0–15.0)
MCH: 30.7 pg (ref 26.0–34.0)
Platelets: 231 10*3/uL (ref 150–400)
RBC: 3.03 MIL/uL — ABNORMAL LOW (ref 3.87–5.11)
RDW: 13.6 % (ref 11.5–15.5)
WBC: 7.3 10*3/uL (ref 4.0–10.5)

## 2013-01-25 NOTE — Progress Notes (Signed)
Patient ID: Katherine Mcpherson, female   DOB: Aug 01, 1945, 67 y.o.   MRN: [id number] PATIENT ID: Katherine Mcpherson  MRN: [id number]  DOB/AGE:  Jul 26, 1945 / 67 y.o.  3 Days Post-Op Procedure(s) (LRB): LEFT TOTAL KNEE ARTHROPLASTY (Left)    PROGRESS NOTE Subjective: Patient is alert, oriented, no Nausea, no Vomiting, yes passing gas, yes Bowel Movement. Taking PO well. Denies SOB, Chest or Calf Pain. Using Incentive Spirometer, PAS in place. Ambulate in hallway with no difficulty, CPM 0-80 Patient reports pain as 2 on 0-10 scale  .    Objective: Vital signs in last 24 hours: Filed Vitals:   01/24/13 2341 01/25/13 0000 01/25/13 0400 01/25/13 0512  BP:    129/63  Pulse:    77  Temp: 99.4 F (37.4 C)   98.7 F (37.1 C)  TempSrc:      Resp:  16 16 16   SpO2:  98% 98% 100%      Intake/Output from previous day: I/O last 3 completed shifts: In: 840 [P.O.:840] Out: -    Intake/Output this shift:     LABORATORY DATA:  Recent Labs  01/24/13 0451 01/25/13 0514  WBC 9.1 7.3  HGB 9.5* 9.3*  HCT 28.8* 27.2*  PLT 227 231    Examination: Neurologically intact ABD soft Neurovascular intact Sensation intact distally Intact pulses distally Dorsiflexion/Plantar flexion intact Incision: no drainage No cellulitis present Compartment soft}  Assessment:   3 Days Post-Op Procedure(s) (LRB): LEFT TOTAL KNEE ARTHROPLASTY (Left) ADDITIONAL DIAGNOSIS:  Hypertension  Plan: PT/OT WBAT, CPM 5/hrs day until ROM 0-90 degrees, then D/C CPM DVT Prophylaxis:  SCDx72hrs, ASA 325 mg BID x 2 weeks DISCHARGE PLAN: Home, discharge home today DISCHARGE NEEDS: HHPT, HHRN, CPM, Walker and 3-in-1 comode seat     Lolitha Tortora J 01/25/2013, 8:39 AM

## 2013-01-25 NOTE — Progress Notes (Signed)
Pt discharged to home. Discharge instructions given. No questions verbalized. Vitals stable. 

## 2013-01-25 NOTE — Discharge Summary (Signed)
Patient ID: Katherine Mcpherson MRN: [id number] DOB/AGE: [birth year] 67 y.o.  Admit date: 01/22/2013 Discharge date: 01/25/2013  Admission Diagnoses:  Principal Problem:   Osteoarthritis of left knee   Discharge Diagnoses:  Same  Past Medical History  Diagnosis Date  . Hypertension   . Polyp of colon 10/2009  . Allergy   . PONV (postoperative nausea and vomiting)   . GERD (gastroesophageal reflux disease)   . H/O hiatal hernia   . High cholesterol   . Chronic lower back pain     "ongoing since back OR 2013" (01/22/2013)  . Anemia     "comes and goes" (01/22/2013)  . Fall from slip, trip, or stumble 01/11/2013    "stubbed toe at gas pump at [employer]" (01/22/2013)  . Arthritis     "qwhere" (01/22/2013)    Surgeries: Procedure(s): LEFT TOTAL KNEE ARTHROPLASTY on 01/22/2013   Consultants:    Discharged Condition: Improved  Hospital Course: Katherine Mcpherson is an 67 y.o. female who was admitted 01/22/2013 for operative treatment ofOsteoarthritis of left knee. Patient has severe unremitting pain that affects sleep, daily activities, and work/hobbies. After pre-op clearance the patient was taken to the operating room on 01/22/2013 and underwent  Procedure(s): LEFT TOTAL KNEE ARTHROPLASTY.    Patient was given perioperative antibiotics: Anti-infectives   Start     Dose/Rate Route Frequency Ordered Stop   01/22/13 0831  cefUROXime (ZINACEF) injection  Status:  Discontinued       As needed 01/22/13 0831 01/22/13 0935   01/22/13 0600  vancomycin (VANCOCIN) 1,500 mg in sodium chloride 0.9 % 500 mL IVPB     1,500 mg 250 mL/hr over 120 Minutes Intravenous On call to O.R. 01/21/13 1401 01/22/13 0732       Patient was given sequential compression devices, early ambulation, and chemoprophylaxis to prevent DVT.  Patient benefited maximally from hospital stay and there were no complications.  Patient passed all physical therapy goals including stairs and walking  feet prior to  discharge  Recent vital signs: Patient Vitals for the past 24 hrs:  BP Temp Pulse Resp SpO2  01/25/13 0512 129/63 mmHg 98.7 F (37.1 C) 77 16 100 %  01/25/13 0400 - - - 16 98 %  01/25/13 0000 - - - 16 98 %  01/24/13 2341 - 99.4 F (37.4 C) - - -  01/24/13 2059 132/58 mmHg 101.1 F (38.4 C) 97 16 98 %  01/24/13 2000 - - - 16 98 %  01/24/13 1600 - - - 18 98 %  01/24/13 1434 120/50 mmHg 99.6 F (37.6 C) 89 18 98 %  01/24/13 1200 - - - 18 96 %  01/24/13 1026 145/56 mmHg - - - -     Recent laboratory studies:  Recent Labs  01/24/13 0451 01/25/13 0514  WBC 9.1 7.3  HGB 9.5* 9.3*  HCT 28.8* 27.2*  PLT 227 231     Discharge Medications:     Medication List    STOP taking these medications       diclofenac 75 MG EC tablet  Commonly known as:  VOLTAREN      TAKE these medications       acetaminophen 650 MG CR tablet  Commonly known as:  TYLENOL  Take 650 mg by mouth 2 (two) times daily as needed for pain.     aspirin EC 325 MG tablet  Take 1 tablet (325 mg total) by mouth 2 (two) times daily.     bisacodyl 5 MG  EC tablet  Commonly known as:  DULCOLAX  Take 5 mg by mouth daily as needed for constipation.     carisoprodol 350 MG tablet  Commonly known as:  SOMA  Take 1 tablet (350 mg total) by mouth 3 (three) times daily as needed for muscle spasms.     CLARITIN 10 MG tablet  Generic drug:  loratadine  Take 10 mg by mouth daily as needed for allergies.     gabapentin 300 MG capsule  Commonly known as:  NEURONTIN  Take 300 mg by mouth 3 (three) times daily.     lisinopril 20 MG tablet  Commonly known as:  PRINIVIL,ZESTRIL  Take 1 tablet (20 mg total) by mouth daily.     multivitamin tablet  Take 1 tablet by mouth daily.     omeprazole 20 MG tablet  Commonly known as:  PRILOSEC OTC  Take 20 mg by mouth daily as needed (hiatal hernia).     OVER THE COUNTER MEDICATION  Take 2 capsules by mouth daily. CholestOff     oxyCODONE-acetaminophen 5-325 MG per  tablet  Commonly known as:  ROXICET  Take 1 tablet by mouth every 4 (four) hours as needed.        Diagnostic Studies: No results found.  Disposition: 06-Home-Health Care Svc      Discharge Orders   Future Orders Complete By Expires   Call MD / Call 911  As directed    Comments:     If you experience chest pain or shortness of breath, CALL 911 and be transported to the hospital emergency room.  If you develope a fever above 101 F, pus (white drainage) or increased drainage or redness at the wound, or calf pain, call your surgeon's office.   Change dressing  As directed    Comments:     Change dressing on POD #5.  You may clean the incision with alcohol prior to redressing.   Constipation Prevention  As directed    Comments:     Drink plenty of fluids.  Prune juice may be helpful.  You may use a stool softener, such as Colace (over the counter) 100 mg twice a day.  Use MiraLax (over the counter) for constipation as needed.   CPM  As directed    Comments:     Continuous passive motion machine (CPM):      Use the CPM from 0 to 60 for 5 hours per day.      You may increase by 10 degrees per day.  You may break it up into 2 or 3 sessions per day.      Use CPM for 2 weeks or until you are told to stop.   Diet - low sodium heart healthy  As directed    Do not put a pillow under the knee. Place it under the heel.  As directed    Driving restrictions  As directed    Comments:     No driving for 2 weeks   Increase activity slowly as tolerated  As directed    Patient may shower  As directed    Comments:     You may shower without a dressing once there is no drainage.  Do not wash over the wound.  If drainage remains, cover wound with plastic wrap and then shower.      Follow-up Information   Follow up with [name], MD.   Specialty:  Orthopedic Surgery   Contact information:   [phone]  [address] [place] [address] [phone]        Signed: [name] 01/25/2013,  8:43 AM

## 2013-01-26 DIAGNOSIS — Z96659 Presence of unspecified artificial knee joint: Secondary | ICD-10-CM | POA: Diagnosis not present

## 2013-01-26 DIAGNOSIS — M545 Low back pain, unspecified: Secondary | ICD-10-CM | POA: Diagnosis not present

## 2013-01-26 DIAGNOSIS — IMO0001 Reserved for inherently not codable concepts without codable children: Secondary | ICD-10-CM | POA: Diagnosis not present

## 2013-01-26 DIAGNOSIS — Z471 Aftercare following joint replacement surgery: Secondary | ICD-10-CM | POA: Diagnosis not present

## 2013-01-26 DIAGNOSIS — IMO0002 Reserved for concepts with insufficient information to code with codable children: Secondary | ICD-10-CM | POA: Diagnosis not present

## 2013-01-26 DIAGNOSIS — M1710 Unilateral primary osteoarthritis, unspecified knee: Secondary | ICD-10-CM | POA: Diagnosis not present

## 2013-01-29 DIAGNOSIS — M545 Low back pain, unspecified: Secondary | ICD-10-CM | POA: Diagnosis not present

## 2013-01-29 DIAGNOSIS — IMO0002 Reserved for concepts with insufficient information to code with codable children: Secondary | ICD-10-CM | POA: Diagnosis not present

## 2013-01-29 DIAGNOSIS — Z471 Aftercare following joint replacement surgery: Secondary | ICD-10-CM | POA: Diagnosis not present

## 2013-01-29 DIAGNOSIS — M1710 Unilateral primary osteoarthritis, unspecified knee: Secondary | ICD-10-CM | POA: Diagnosis not present

## 2013-01-29 DIAGNOSIS — Z96659 Presence of unspecified artificial knee joint: Secondary | ICD-10-CM | POA: Diagnosis not present

## 2013-01-29 DIAGNOSIS — IMO0001 Reserved for inherently not codable concepts without codable children: Secondary | ICD-10-CM | POA: Diagnosis not present

## 2013-01-30 DIAGNOSIS — IMO0001 Reserved for inherently not codable concepts without codable children: Secondary | ICD-10-CM | POA: Diagnosis not present

## 2013-01-30 DIAGNOSIS — IMO0002 Reserved for concepts with insufficient information to code with codable children: Secondary | ICD-10-CM | POA: Diagnosis not present

## 2013-01-30 DIAGNOSIS — Z96659 Presence of unspecified artificial knee joint: Secondary | ICD-10-CM | POA: Diagnosis not present

## 2013-01-30 DIAGNOSIS — M545 Low back pain, unspecified: Secondary | ICD-10-CM | POA: Diagnosis not present

## 2013-01-30 DIAGNOSIS — Z471 Aftercare following joint replacement surgery: Secondary | ICD-10-CM | POA: Diagnosis not present

## 2013-01-30 DIAGNOSIS — M1710 Unilateral primary osteoarthritis, unspecified knee: Secondary | ICD-10-CM | POA: Diagnosis not present

## 2013-01-31 DIAGNOSIS — M545 Low back pain, unspecified: Secondary | ICD-10-CM | POA: Diagnosis not present

## 2013-01-31 DIAGNOSIS — Z471 Aftercare following joint replacement surgery: Secondary | ICD-10-CM | POA: Diagnosis not present

## 2013-01-31 DIAGNOSIS — M1710 Unilateral primary osteoarthritis, unspecified knee: Secondary | ICD-10-CM | POA: Diagnosis not present

## 2013-01-31 DIAGNOSIS — Z96659 Presence of unspecified artificial knee joint: Secondary | ICD-10-CM | POA: Diagnosis not present

## 2013-01-31 DIAGNOSIS — IMO0002 Reserved for concepts with insufficient information to code with codable children: Secondary | ICD-10-CM | POA: Diagnosis not present

## 2013-01-31 DIAGNOSIS — IMO0001 Reserved for inherently not codable concepts without codable children: Secondary | ICD-10-CM | POA: Diagnosis not present

## 2013-02-01 DIAGNOSIS — M545 Low back pain, unspecified: Secondary | ICD-10-CM | POA: Diagnosis not present

## 2013-02-01 DIAGNOSIS — IMO0002 Reserved for concepts with insufficient information to code with codable children: Secondary | ICD-10-CM | POA: Diagnosis not present

## 2013-02-01 DIAGNOSIS — IMO0001 Reserved for inherently not codable concepts without codable children: Secondary | ICD-10-CM | POA: Diagnosis not present

## 2013-02-01 DIAGNOSIS — M1710 Unilateral primary osteoarthritis, unspecified knee: Secondary | ICD-10-CM | POA: Diagnosis not present

## 2013-02-01 DIAGNOSIS — Z471 Aftercare following joint replacement surgery: Secondary | ICD-10-CM | POA: Diagnosis not present

## 2013-02-01 DIAGNOSIS — Z96659 Presence of unspecified artificial knee joint: Secondary | ICD-10-CM | POA: Diagnosis not present

## 2013-02-02 DIAGNOSIS — M545 Low back pain, unspecified: Secondary | ICD-10-CM | POA: Diagnosis not present

## 2013-02-02 DIAGNOSIS — Z96659 Presence of unspecified artificial knee joint: Secondary | ICD-10-CM | POA: Diagnosis not present

## 2013-02-02 DIAGNOSIS — IMO0002 Reserved for concepts with insufficient information to code with codable children: Secondary | ICD-10-CM | POA: Diagnosis not present

## 2013-02-02 DIAGNOSIS — IMO0001 Reserved for inherently not codable concepts without codable children: Secondary | ICD-10-CM | POA: Diagnosis not present

## 2013-02-02 DIAGNOSIS — Z471 Aftercare following joint replacement surgery: Secondary | ICD-10-CM | POA: Diagnosis not present

## 2013-02-02 DIAGNOSIS — M1710 Unilateral primary osteoarthritis, unspecified knee: Secondary | ICD-10-CM | POA: Diagnosis not present

## 2013-02-05 DIAGNOSIS — IMO0001 Reserved for inherently not codable concepts without codable children: Secondary | ICD-10-CM | POA: Diagnosis not present

## 2013-02-05 DIAGNOSIS — Z96659 Presence of unspecified artificial knee joint: Secondary | ICD-10-CM | POA: Diagnosis not present

## 2013-02-05 DIAGNOSIS — M545 Low back pain, unspecified: Secondary | ICD-10-CM | POA: Diagnosis not present

## 2013-02-05 DIAGNOSIS — Z471 Aftercare following joint replacement surgery: Secondary | ICD-10-CM | POA: Diagnosis not present

## 2013-02-05 DIAGNOSIS — IMO0002 Reserved for concepts with insufficient information to code with codable children: Secondary | ICD-10-CM | POA: Diagnosis not present

## 2013-02-05 DIAGNOSIS — M1710 Unilateral primary osteoarthritis, unspecified knee: Secondary | ICD-10-CM | POA: Diagnosis not present

## 2013-02-06 DIAGNOSIS — M1710 Unilateral primary osteoarthritis, unspecified knee: Secondary | ICD-10-CM | POA: Diagnosis not present

## 2013-02-07 DIAGNOSIS — Z96659 Presence of unspecified artificial knee joint: Secondary | ICD-10-CM | POA: Diagnosis not present

## 2013-02-07 DIAGNOSIS — M545 Low back pain, unspecified: Secondary | ICD-10-CM | POA: Diagnosis not present

## 2013-02-07 DIAGNOSIS — M1710 Unilateral primary osteoarthritis, unspecified knee: Secondary | ICD-10-CM | POA: Diagnosis not present

## 2013-02-07 DIAGNOSIS — IMO0002 Reserved for concepts with insufficient information to code with codable children: Secondary | ICD-10-CM | POA: Diagnosis not present

## 2013-02-07 DIAGNOSIS — Z471 Aftercare following joint replacement surgery: Secondary | ICD-10-CM | POA: Diagnosis not present

## 2013-02-07 DIAGNOSIS — IMO0001 Reserved for inherently not codable concepts without codable children: Secondary | ICD-10-CM | POA: Diagnosis not present

## 2013-02-09 DIAGNOSIS — M545 Low back pain, unspecified: Secondary | ICD-10-CM | POA: Diagnosis not present

## 2013-02-09 DIAGNOSIS — IMO0001 Reserved for inherently not codable concepts without codable children: Secondary | ICD-10-CM | POA: Diagnosis not present

## 2013-02-09 DIAGNOSIS — IMO0002 Reserved for concepts with insufficient information to code with codable children: Secondary | ICD-10-CM | POA: Diagnosis not present

## 2013-02-09 DIAGNOSIS — M1710 Unilateral primary osteoarthritis, unspecified knee: Secondary | ICD-10-CM | POA: Diagnosis not present

## 2013-02-09 DIAGNOSIS — Z471 Aftercare following joint replacement surgery: Secondary | ICD-10-CM | POA: Diagnosis not present

## 2013-02-09 DIAGNOSIS — Z96659 Presence of unspecified artificial knee joint: Secondary | ICD-10-CM | POA: Diagnosis not present

## 2013-03-16 DIAGNOSIS — M545 Low back pain, unspecified: Secondary | ICD-10-CM | POA: Diagnosis not present

## 2013-03-16 DIAGNOSIS — M47817 Spondylosis without myelopathy or radiculopathy, lumbosacral region: Secondary | ICD-10-CM | POA: Diagnosis not present

## 2013-03-16 DIAGNOSIS — M4120 Other idiopathic scoliosis, site unspecified: Secondary | ICD-10-CM | POA: Diagnosis not present

## 2013-03-16 DIAGNOSIS — M961 Postlaminectomy syndrome, not elsewhere classified: Secondary | ICD-10-CM | POA: Diagnosis not present

## 2013-03-28 DIAGNOSIS — M5380 Other specified dorsopathies, site unspecified: Secondary | ICD-10-CM | POA: Diagnosis not present

## 2013-03-28 DIAGNOSIS — M47817 Spondylosis without myelopathy or radiculopathy, lumbosacral region: Secondary | ICD-10-CM | POA: Diagnosis not present

## 2013-04-06 ENCOUNTER — Other Ambulatory Visit: Payer: Self-pay | Admitting: Internal Medicine

## 2013-04-06 ENCOUNTER — Encounter: Payer: Self-pay | Admitting: Internal Medicine

## 2013-04-07 ENCOUNTER — Ambulatory Visit (INDEPENDENT_AMBULATORY_CARE_PROVIDER_SITE_OTHER): Payer: Medicare Other | Admitting: Internal Medicine

## 2013-04-07 ENCOUNTER — Encounter: Payer: Self-pay | Admitting: Internal Medicine

## 2013-04-07 VITALS — BP 140/80 | HR 59 | Temp 98.4°F | Resp 12 | Ht 68.7 in | Wt 216.0 lb

## 2013-04-07 DIAGNOSIS — I10 Essential (primary) hypertension: Secondary | ICD-10-CM | POA: Diagnosis not present

## 2013-04-07 DIAGNOSIS — R609 Edema, unspecified: Secondary | ICD-10-CM | POA: Diagnosis not present

## 2013-04-07 MED ORDER — LISINOPRIL-HYDROCHLOROTHIAZIDE 10-12.5 MG PO TABS: 1.0000 | ORAL_TABLET | Freq: Every day | ORAL | Status: DC

## 2013-04-07 NOTE — Progress Notes (Signed)
   Subjective:    Patient ID: Katherine Mcpherson, female    DOB: 23-Jul-1945, 68 y.o.   MRN: [id number] This chart was scribed for Tami Lin, MD by Vernell Barrier, Medical Scribe. This patient's care was started at 9:41 AM.  Ear Drainage  Associated symptoms include ear discharge. Pertinent negatives include no hearing loss.  HPI Comments: Katherine Mcpherson is a 68 y.o. female who presents to the [facility] for medication refill and ear drainage.  Pt would like a Lisinopril refill. BP usually around 137/82. States occasionally around 105/60. Feels like sometimes its lower after exercise. States she works out on her bike about 3x/week. Pt says she does not do water aerobics. Denies dizziness or fatigue.  Pt also has some lower extremity edema, onset since 2 knee replacements. States swelling is usually worse on the right and right knee was the one that was replaced during Christmas 2014. She states the pain usually resolves at night but not always if she is on her feet for long periods of time the day prior..  Pt also reports bloody drainage out of right ear, onset about 1 week. Pt states drainage resolved for 3 or 4 days ago. She states this has occurred before. Denies  any infection or trauma related to drainage.  Review of Systems  HENT: Positive for ear discharge. Negative for hearing loss.   Cardiovascular: Positive for leg swelling.   Objective:   Physical Exam  Nursing note and vitals reviewed. Constitutional: She is oriented to person, place, and time. She appears well-developed and well-nourished. No distress.  HENT:  Head: Normocephalic and atraumatic.  Right Ear: There is drainage (Dried, bloody). No tenderness.  Left Ear: No drainage or tenderness. Tympanic membrane is erythematous.  Eyes: EOM are normal.  Neck: Neck supple. No tracheal deviation present.  Cardiovascular: Normal rate.   Pulmonary/Chest: Effort normal. No respiratory distress.    Musculoskeletal: Normal range of motion.  Neurological: She is alert and oriented to person, place, and time.  Skin: Skin is warm and dry.  Psychiatric: She has a normal mood and affect. Her behavior is normal.   Assessment & Plan:  I have completed the patient encounter in its entirety as documented by the scribe, with editing by me where necessary. Katherine Mcpherson, M.D. HTN (hypertension)  Edema--inflamm poist surg w/ mild bilat idiopath--trial hct in bp med  Meds ordered this encounter  Medications  . oxycodone (OXY-IR) 5 MG capsule    Sig: Take 5 mg by mouth every 4 (four) hours as needed for pain.  Marland Kitchen diclofenac (VOLTAREN) 75 MG EC tablet    Sig: Take 75 mg by mouth daily. 1-2 per day as needed  . DISCONTD: lisinopril-hydrochlorothiazide (PRINZIDE,ZESTORETIC) 10-12.5 MG per tablet    Sig: Take 1 tablet by mouth daily.    Dispense:  90 tablet    Refill:  3  . lisinopril-hydrochlorothiazide (PRINZIDE,ZESTORETIC) 10-12.5 MG per tablet    Sig: Take 1 tablet by mouth daily.    Dispense:  90 tablet    Refill:  3

## 2013-04-10 ENCOUNTER — Telehealth: Payer: Self-pay

## 2013-04-10 MED ORDER — LISINOPRIL-HYDROCHLOROTHIAZIDE 10-12.5 MG PO TABS: 1.0000 | ORAL_TABLET | Freq: Every day | ORAL | Status: DC

## 2013-04-10 NOTE — Telephone Encounter (Signed)
[facility] reqs Rx for lisinopril HCT. Rx was sent to [facility] at [place]. I will resend to [facility].

## 2013-04-25 DIAGNOSIS — M47817 Spondylosis without myelopathy or radiculopathy, lumbosacral region: Secondary | ICD-10-CM | POA: Diagnosis not present

## 2013-04-25 DIAGNOSIS — M4120 Other idiopathic scoliosis, site unspecified: Secondary | ICD-10-CM | POA: Diagnosis not present

## 2013-04-25 DIAGNOSIS — M545 Low back pain, unspecified: Secondary | ICD-10-CM | POA: Diagnosis not present

## 2013-04-25 DIAGNOSIS — M961 Postlaminectomy syndrome, not elsewhere classified: Secondary | ICD-10-CM | POA: Diagnosis not present

## 2013-05-01 DIAGNOSIS — M1710 Unilateral primary osteoarthritis, unspecified knee: Secondary | ICD-10-CM | POA: Diagnosis not present

## 2013-06-07 DIAGNOSIS — H11159 Pinguecula, unspecified eye: Secondary | ICD-10-CM | POA: Diagnosis not present

## 2013-06-07 DIAGNOSIS — H2510 Age-related nuclear cataract, unspecified eye: Secondary | ICD-10-CM | POA: Diagnosis not present

## 2013-06-16 ENCOUNTER — Ambulatory Visit (INDEPENDENT_AMBULATORY_CARE_PROVIDER_SITE_OTHER): Payer: Medicare Other | Admitting: Internal Medicine

## 2013-06-16 VITALS — BP 152/76 | HR 79 | Temp 98.1°F | Resp 18 | Ht 69.0 in | Wt 217.0 lb

## 2013-06-16 DIAGNOSIS — I10 Essential (primary) hypertension: Secondary | ICD-10-CM | POA: Diagnosis not present

## 2013-06-16 DIAGNOSIS — R5381 Other malaise: Secondary | ICD-10-CM

## 2013-06-16 DIAGNOSIS — R5383 Other fatigue: Secondary | ICD-10-CM | POA: Diagnosis not present

## 2013-06-16 DIAGNOSIS — M549 Dorsalgia, unspecified: Secondary | ICD-10-CM

## 2013-06-16 LAB — COMPREHENSIVE METABOLIC PANEL
ALBUMIN: 4 g/dL (ref 3.5–5.2)
ALT: 17 U/L (ref 0–35)
AST: 17 U/L (ref 0–37)
Alkaline Phosphatase: 75 U/L (ref 39–117)
BUN: 28 mg/dL — AB (ref 6–23)
CHLORIDE: 104 meq/L (ref 96–112)
CO2: 25 mEq/L (ref 19–32)
Calcium: 9.3 mg/dL (ref 8.4–10.5)
Creat: 1.01 mg/dL (ref 0.50–1.10)
GLUCOSE: 99 mg/dL (ref 70–99)
Potassium: 4.7 mEq/L (ref 3.5–5.3)
Sodium: 136 mEq/L (ref 135–145)
TOTAL PROTEIN: 6.6 g/dL (ref 6.0–8.3)
Total Bilirubin: 0.5 mg/dL (ref 0.2–1.2)

## 2013-06-16 LAB — POCT CBC
Granulocyte percent: 75.7 %G (ref 37–80)
HCT, POC: 41.7 % (ref 37.7–47.9)
HEMOGLOBIN: 13.1 g/dL (ref 12.2–16.2)
Lymph, poc: 1.4 (ref 0.6–3.4)
MCH, POC: 28.4 pg (ref 27–31.2)
MCHC: 31.4 g/dL — AB (ref 31.8–35.4)
MCV: 90.4 fL (ref 80–97)
MID (cbc): 0.3 (ref 0–0.9)
MPV: 9.8 fL (ref 0–99.8)
PLATELET COUNT, POC: 329 10*3/uL (ref 142–424)
POC Granulocyte: 5.3 (ref 2–6.9)
POC LYMPH PERCENT: 19.4 %L (ref 10–50)
POC MID %: 4.9 % (ref 0–12)
RBC: 4.61 M/uL (ref 4.04–5.48)
RDW, POC: 14.3 %
WBC: 7 10*3/uL (ref 4.6–10.2)

## 2013-06-16 LAB — POCT SEDIMENTATION RATE: POCT SED RATE: 15 mm/hr (ref 0–22)

## 2013-06-16 LAB — T4, FREE: FREE T4: 1.04 ng/dL (ref 0.80–1.80)

## 2013-06-16 LAB — TSH: TSH: 1.054 u[IU]/mL (ref 0.350–4.500)

## 2013-06-16 NOTE — Progress Notes (Signed)
Subjective:   This chart was scribed for Tami Lin, MD by Forrestine Him, [facility] Scribe. This patient was seen in room 5 and the patient's care was started 11:04 AM.    Patient ID: Katherine Mcpherson, female    DOB: 1945-11-10, 68 y.o.   MRN: [id number]  Chief Complaint  Patient presents with  . Hypertension    elevated this morning      HPI  HPI Comments: Katherine Mcpherson is a 68 y.o. female who presents to [facility] complaining of elevated blood pressure x 1 day. She also reports recent worsening fatigue. Pt states she did not sleep well last night and is unaware if this is associated with her high blood pressure. Pt states her blood pressure yesterday was 135/75. This morning she recorded 181/100, 161/103 R, and 155/100 L blood pressure readings. She reports taking her Gabapentin and Claritin as prescribed this morning. However, she states she skipped her coffee this morning. At this time she denies any unexpected weight change, fever, chills, hair lose, or SOB with exertion.  Pt states she rides a bicycle at the Gym 3 times a week. This week she has noted bilateral weakness in her lower extremities. She denies any pain, but states she notes "heaviness". At times she feels her legs will "give out" on her.  No other concerns this visit.  Patient Active Problem List   Diagnosis Date Noted  . Osteoarthritis of left knee 01/22/2013  . Osteoarthritis of right knee 07/31/2012  . HTN (hypertension) 12/30/2011  . Overweight 12/30/2011  . Vaginal atrophy 12/30/2011  . Menopausal state 12/30/2011  . Low back pain 12/15/2011  s/p knee repl--2014 did well S/p back surgery 2014 and still has pain    Review of Systems  Constitutional: Positive for fatigue. Negative for fever, chills and unexpected weight change.  HENT: Negative for congestion.   Eyes: Negative for redness.  Respiratory: Negative for cough and shortness of breath.   Skin:  Negative for rash.  Psychiatric/Behavioral: Negative for confusion.    Triage Vitals: BP 152/76  Pulse 79  Temp(Src) 98.1 F (36.7 C) (Oral)  Resp 18  Ht 5\' 9"  (1.753 m)  Wt 217 lb (98.431 kg)  BMI 32.03 kg/m2  SpO2 99%   Objective:  Physical Exam  Nursing note and vitals reviewed. Constitutional: She is oriented to person, place, and time. She appears well-developed and well-nourished.  HENT:  Head: Normocephalic and atraumatic.  Eyes: EOM are normal. Pupils are equal, round, and reactive to light.  Neck: Normal range of motion. No thyromegaly present.  Cardiovascular: Normal rate, regular rhythm and normal heart sounds.   No murmur heard. Pulmonary/Chest: Effort normal.  Musculoskeletal: Normal range of motion.  Lymphadenopathy:    She has no cervical adenopathy.  Neurological: She is alert and oriented to person, place, and time.  Straight leg raise normal  Skin: Skin is warm and dry.  Psychiatric: She has a normal mood and affect. Her behavior is normal.     Assessment & Plan:   I personally performed the services described in this documentation, which was scribed in my presence. The recorded information has been reviewed and is accurate.   Hypertension - Plan: Comprehensive metabolic panel, TSH, T4, free, POCT CBC, POCT SEDIMENTATION RATE  Home bp record 2-4 weeks Fatigue - Plan: TSH, T4, free, POCT CBC, POCT SEDIMENTATION RATE  Back pain--continue post surgical work/if weakness continues will need pncv/emg--? Stenosis that affects  her w/ posture on  bike

## 2013-06-18 ENCOUNTER — Encounter: Payer: Self-pay | Admitting: Internal Medicine

## 2013-06-21 DIAGNOSIS — M961 Postlaminectomy syndrome, not elsewhere classified: Secondary | ICD-10-CM | POA: Diagnosis not present

## 2013-06-21 DIAGNOSIS — M545 Low back pain, unspecified: Secondary | ICD-10-CM | POA: Diagnosis not present

## 2013-06-21 DIAGNOSIS — M47817 Spondylosis without myelopathy or radiculopathy, lumbosacral region: Secondary | ICD-10-CM | POA: Diagnosis not present

## 2013-06-22 ENCOUNTER — Encounter: Payer: Self-pay | Admitting: Internal Medicine

## 2013-06-25 MED ORDER — LISINOPRIL 10 MG PO TABS: 10.0000 mg | ORAL_TABLET | Freq: Every day | ORAL | Status: DC

## 2013-06-30 ENCOUNTER — Encounter: Payer: Self-pay | Admitting: Internal Medicine

## 2013-06-30 DIAGNOSIS — I10 Essential (primary) hypertension: Secondary | ICD-10-CM

## 2013-07-01 MED ORDER — LISINOPRIL 20 MG PO TABS: 20.0000 mg | ORAL_TABLET | Freq: Every day | ORAL | Status: DC

## 2013-07-24 DIAGNOSIS — M7520 Bicipital tendinitis, unspecified shoulder: Secondary | ICD-10-CM | POA: Diagnosis not present

## 2013-08-15 ENCOUNTER — Encounter: Payer: Self-pay | Admitting: Internal Medicine

## 2013-09-06 DIAGNOSIS — M545 Low back pain, unspecified: Secondary | ICD-10-CM | POA: Diagnosis not present

## 2013-09-06 DIAGNOSIS — M47817 Spondylosis without myelopathy or radiculopathy, lumbosacral region: Secondary | ICD-10-CM | POA: Diagnosis not present

## 2013-09-06 DIAGNOSIS — M961 Postlaminectomy syndrome, not elsewhere classified: Secondary | ICD-10-CM | POA: Diagnosis not present

## 2013-09-06 DIAGNOSIS — M4120 Other idiopathic scoliosis, site unspecified: Secondary | ICD-10-CM | POA: Diagnosis not present

## 2013-09-27 DIAGNOSIS — M25559 Pain in unspecified hip: Secondary | ICD-10-CM | POA: Diagnosis not present

## 2013-11-15 ENCOUNTER — Ambulatory Visit (INDEPENDENT_AMBULATORY_CARE_PROVIDER_SITE_OTHER): Payer: Medicare Other | Admitting: Family Medicine

## 2013-11-15 ENCOUNTER — Ambulatory Visit (INDEPENDENT_AMBULATORY_CARE_PROVIDER_SITE_OTHER): Payer: Medicare Other

## 2013-11-15 VITALS — BP 110/80 | HR 57 | Temp 98.2°F | Resp 16 | Ht 70.0 in | Wt 219.0 lb

## 2013-11-15 DIAGNOSIS — K5900 Constipation, unspecified: Secondary | ICD-10-CM

## 2013-11-15 DIAGNOSIS — J3489 Other specified disorders of nose and nasal sinuses: Secondary | ICD-10-CM

## 2013-11-15 DIAGNOSIS — R51 Headache: Secondary | ICD-10-CM | POA: Diagnosis not present

## 2013-11-15 DIAGNOSIS — R109 Unspecified abdominal pain: Secondary | ICD-10-CM | POA: Diagnosis not present

## 2013-11-15 DIAGNOSIS — R1012 Left upper quadrant pain: Secondary | ICD-10-CM | POA: Diagnosis not present

## 2013-11-15 LAB — POCT CBC
Granulocyte percent: 73 %G (ref 37–80)
HEMATOCRIT: 39.9 % (ref 37.7–47.9)
HEMOGLOBIN: 13.4 g/dL (ref 12.2–16.2)
LYMPH, POC: 1.8 (ref 0.6–3.4)
MCH: 29.8 pg (ref 27–31.2)
MCHC: 33.5 g/dL (ref 31.8–35.4)
MCV: 88.9 fL (ref 80–97)
MID (cbc): 0.5 (ref 0–0.9)
MPV: 8.2 fL (ref 0–99.8)
POC Granulocyte: 6.1 (ref 2–6.9)
POC LYMPH PERCENT: 21.6 %L (ref 10–50)
POC MID %: 5.4 %M (ref 0–12)
Platelet Count, POC: 277 10*3/uL (ref 142–424)
RBC: 4.49 M/uL (ref 4.04–5.48)
RDW, POC: 13.1 %
WBC: 8.4 10*3/uL (ref 4.6–10.2)

## 2013-11-15 MED ORDER — TRAMADOL HCL 50 MG PO TABS: ORAL_TABLET | ORAL | Status: DC

## 2013-11-15 NOTE — Patient Instructions (Addendum)
Tramadol for headache pain 6 hours if needed  Drink plenty of fluids  Take MiraLax one dose daily.  Use a fleets enema today to try to evacuate bowels, may repeat once tonight if needed.  Take a stool softener  If acute abdominal pain return or go to the emergency room.  If not improving over the next couple of days please return

## 2013-11-15 NOTE — Progress Notes (Signed)
Subjective. 68 year old lady who is here with 2 primary complaints. She has had an episode of pain in her abdomen which is pretty severe about 10 minutes in duration, and that ever since then has had a persistent discomfort. It is in the left upper quadrant and across the upper abdomen. Actually it's almost in the lower chest wall region. Since yesterday she has had a headache which started around the back of her hand and sides and all over. She has some pressure congested sensation in her face, only mild nasal drainage. She does not smoke. It did not keep her awake at night, but the headache was still there in the morning. No nausea or vomiting. Bowels act about every other day, regular feels she is constipated. No urinary symptoms.  Objective: Pleasant alert lady in no major distress at this time. TMs normal. Eyes PERRLA. Throat clear. Neck supple without nodes. Nose looks normal. Chest clear to auscultation. Heart regular without murmurs gallops or arrhythmias. Abdomen has active bowel sounds. Soft without masses or tenderness. She points when laying down across the lower age of the rib cage as the area of most discomfort.  Assessment: Left upper quadrant abdominal pain Headache Nonspecific congestion  Plan: CBC, abdominal x-rays Results for orders placed in visit on 11/15/13  POCT CBC      Result Value Ref Range   WBC 8.4  4.6 - 10.2 K/uL   Lymph, poc 1.8  0.6 - 3.4   POC LYMPH PERCENT 21.6  10 - 50 %L   MID (cbc) 0.5  0 - 0.9   POC MID % 5.4  0 - 12 %M   POC Granulocyte 6.1  2 - 6.9   Granulocyte percent 73.0  37 - 80 %G   RBC 4.49  4.04 - 5.48 M/uL   Hemoglobin 13.4  12.2 - 16.2 g/dL   HCT, POC 39.9  37.7 - 47.9 %   MCV 88.9  80 - 97 fL   MCH, POC 29.8  27 - 31.2 pg   MCHC 33.5  31.8 - 35.4 g/dL   RDW, POC 13.1     Platelet Count, POC 277  142 - 424 K/uL   MPV 8.2  0 - 99.8 fL   [facility] reading (PRIMARY) by  Dr. Linna Darner Severe scoliosis with hardware lower spine. No acute  abdominal findings. Lots of stool consistent with constipation.  Assessment: Abdominal pain, probably related to constipation. This could cause her headache also.  Plan: Work on getting a bowel cleanout and see how her symptoms are doing.Marland Kitchen

## 2013-12-03 DIAGNOSIS — Z23 Encounter for immunization: Secondary | ICD-10-CM | POA: Diagnosis not present

## 2013-12-05 ENCOUNTER — Other Ambulatory Visit: Payer: Self-pay | Admitting: Gynecology

## 2013-12-05 ENCOUNTER — Encounter: Payer: Self-pay | Admitting: Internal Medicine

## 2013-12-05 DIAGNOSIS — Z1231 Encounter for screening mammogram for malignant neoplasm of breast: Secondary | ICD-10-CM

## 2013-12-06 DIAGNOSIS — M961 Postlaminectomy syndrome, not elsewhere classified: Secondary | ICD-10-CM | POA: Diagnosis not present

## 2013-12-06 DIAGNOSIS — M47817 Spondylosis without myelopathy or radiculopathy, lumbosacral region: Secondary | ICD-10-CM | POA: Diagnosis not present

## 2013-12-06 DIAGNOSIS — M545 Low back pain, unspecified: Secondary | ICD-10-CM | POA: Diagnosis not present

## 2013-12-07 ENCOUNTER — Telehealth: Payer: Self-pay | Admitting: *Deleted

## 2013-12-07 NOTE — Telephone Encounter (Signed)
Pt called to see if pneumonia vaccination was given here. I called pt back and left message on her voicemail that I did not see record of this shot given here. As we did not given injection here as well

## 2013-12-18 ENCOUNTER — Encounter: Payer: Self-pay | Admitting: Internal Medicine

## 2013-12-19 ENCOUNTER — Encounter: Payer: Self-pay | Admitting: Internal Medicine

## 2013-12-21 ENCOUNTER — Other Ambulatory Visit: Payer: Self-pay | Admitting: Gynecology

## 2013-12-21 ENCOUNTER — Ambulatory Visit (HOSPITAL_COMMUNITY)
Admission: RE | Admit: 2013-12-21 | Discharge: 2013-12-21 | Disposition: A | Discharge status: Home / Self Care | Payer: Medicare Other | Source: Ambulatory Visit | Attending: Gynecology | Admitting: Gynecology

## 2013-12-21 DIAGNOSIS — Z1231 Encounter for screening mammogram for malignant neoplasm of breast: Secondary | ICD-10-CM

## 2014-01-14 DIAGNOSIS — M76892 Other specified enthesopathies of left lower limb, excluding foot: Secondary | ICD-10-CM | POA: Diagnosis not present

## 2014-01-22 DIAGNOSIS — Z23 Encounter for immunization: Secondary | ICD-10-CM | POA: Diagnosis not present

## 2014-02-28 DIAGNOSIS — M961 Postlaminectomy syndrome, not elsewhere classified: Secondary | ICD-10-CM | POA: Diagnosis not present

## 2014-02-28 DIAGNOSIS — M47817 Spondylosis without myelopathy or radiculopathy, lumbosacral region: Secondary | ICD-10-CM | POA: Diagnosis not present

## 2014-03-11 ENCOUNTER — Encounter: Payer: Self-pay | Admitting: Internal Medicine

## 2014-04-24 DIAGNOSIS — M7541 Impingement syndrome of right shoulder: Secondary | ICD-10-CM | POA: Diagnosis not present

## 2014-04-24 DIAGNOSIS — M7742 Metatarsalgia, left foot: Secondary | ICD-10-CM | POA: Diagnosis not present

## 2014-06-06 DIAGNOSIS — M545 Low back pain: Secondary | ICD-10-CM | POA: Diagnosis not present

## 2014-06-06 DIAGNOSIS — M47817 Spondylosis without myelopathy or radiculopathy, lumbosacral region: Secondary | ICD-10-CM | POA: Diagnosis not present

## 2014-06-06 DIAGNOSIS — M961 Postlaminectomy syndrome, not elsewhere classified: Secondary | ICD-10-CM | POA: Diagnosis not present

## 2014-06-18 DIAGNOSIS — M47817 Spondylosis without myelopathy or radiculopathy, lumbosacral region: Secondary | ICD-10-CM | POA: Diagnosis not present

## 2014-07-02 DIAGNOSIS — M47817 Spondylosis without myelopathy or radiculopathy, lumbosacral region: Secondary | ICD-10-CM | POA: Diagnosis not present

## 2014-07-02 DIAGNOSIS — M47816 Spondylosis without myelopathy or radiculopathy, lumbar region: Secondary | ICD-10-CM | POA: Diagnosis not present

## 2014-07-17 ENCOUNTER — Encounter: Payer: Medicare Other | Admitting: Internal Medicine

## 2014-07-31 DIAGNOSIS — M47816 Spondylosis without myelopathy or radiculopathy, lumbar region: Secondary | ICD-10-CM | POA: Diagnosis not present

## 2014-08-29 ENCOUNTER — Other Ambulatory Visit: Payer: Self-pay | Admitting: Rehabilitation

## 2014-08-29 DIAGNOSIS — M545 Low back pain: Secondary | ICD-10-CM | POA: Diagnosis not present

## 2014-08-29 DIAGNOSIS — M961 Postlaminectomy syndrome, not elsewhere classified: Secondary | ICD-10-CM | POA: Diagnosis not present

## 2014-08-29 DIAGNOSIS — M549 Dorsalgia, unspecified: Secondary | ICD-10-CM | POA: Diagnosis not present

## 2014-08-29 DIAGNOSIS — M47816 Spondylosis without myelopathy or radiculopathy, lumbar region: Secondary | ICD-10-CM

## 2014-09-02 ENCOUNTER — Ambulatory Visit
Admission: RE | Admit: 2014-09-02 | Discharge: 2014-09-02 | Disposition: A | Discharge status: Home / Self Care | Payer: Medicare Other | Source: Ambulatory Visit | Attending: Rehabilitation | Admitting: Rehabilitation

## 2014-09-02 DIAGNOSIS — M5136 Other intervertebral disc degeneration, lumbar region: Secondary | ICD-10-CM | POA: Diagnosis not present

## 2014-09-02 DIAGNOSIS — M4806 Spinal stenosis, lumbar region: Secondary | ICD-10-CM | POA: Diagnosis not present

## 2014-09-02 DIAGNOSIS — M47816 Spondylosis without myelopathy or radiculopathy, lumbar region: Secondary | ICD-10-CM

## 2014-09-02 MED ORDER — GADOBENATE DIMEGLUMINE 529 MG/ML IV SOLN
10.0000 mL | Freq: Once | INTRAVENOUS | Status: AC | PRN
  Administered 2014-09-02: 10 mL via INTRAVENOUS

## 2014-09-04 ENCOUNTER — Encounter: Payer: Self-pay | Admitting: Internal Medicine

## 2014-09-04 ENCOUNTER — Ambulatory Visit (INDEPENDENT_AMBULATORY_CARE_PROVIDER_SITE_OTHER): Payer: Medicare Other | Admitting: Internal Medicine

## 2014-09-04 VITALS — BP 140/80 | HR 73 | Temp 98.7°F | Resp 16 | Ht 68.25 in | Wt 215.8 lb

## 2014-09-04 DIAGNOSIS — Z1389 Encounter for screening for other disorder: Secondary | ICD-10-CM | POA: Diagnosis not present

## 2014-09-04 DIAGNOSIS — Z130 Encounter for screening for diseases of the blood and blood-forming organs and certain disorders involving the immune mechanism: Secondary | ICD-10-CM | POA: Diagnosis not present

## 2014-09-04 DIAGNOSIS — I10 Essential (primary) hypertension: Secondary | ICD-10-CM | POA: Diagnosis not present

## 2014-09-04 DIAGNOSIS — Z139 Encounter for screening, unspecified: Secondary | ICD-10-CM

## 2014-09-04 DIAGNOSIS — Z0000 Encounter for general adult medical examination without abnormal findings: Secondary | ICD-10-CM | POA: Diagnosis not present

## 2014-09-04 DIAGNOSIS — E669 Obesity, unspecified: Secondary | ICD-10-CM

## 2014-09-04 LAB — CBC
HCT: 39.4 % (ref 36.0–46.0)
HEMOGLOBIN: 13.4 g/dL (ref 12.0–15.0)
MCH: 29.1 pg (ref 26.0–34.0)
MCHC: 34 g/dL (ref 30.0–36.0)
MCV: 85.7 fL (ref 78.0–100.0)
MPV: 10.2 fL (ref 8.6–12.4)
Platelets: 325 10*3/uL (ref 150–400)
RBC: 4.6 MIL/uL (ref 3.87–5.11)
RDW: 13.6 % (ref 11.5–15.5)
WBC: 7.5 10*3/uL (ref 4.0–10.5)

## 2014-09-04 LAB — COMPREHENSIVE METABOLIC PANEL
ALK PHOS: 70 U/L (ref 39–117)
ALT: 10 U/L (ref 0–35)
AST: 15 U/L (ref 0–37)
Albumin: 4.2 g/dL (ref 3.5–5.2)
BILIRUBIN TOTAL: 0.5 mg/dL (ref 0.2–1.2)
BUN: 22 mg/dL (ref 6–23)
CO2: 24 mEq/L (ref 19–32)
Calcium: 9.2 mg/dL (ref 8.4–10.5)
Chloride: 105 mEq/L (ref 96–112)
Creat: 0.93 mg/dL (ref 0.50–1.10)
Glucose, Bld: 94 mg/dL (ref 70–99)
Potassium: 4.4 mEq/L (ref 3.5–5.3)
SODIUM: 140 meq/L (ref 135–145)
Total Protein: 6.7 g/dL (ref 6.0–8.3)

## 2014-09-04 LAB — LIPID PANEL
CHOLESTEROL: 326 mg/dL — AB (ref 0–200)
HDL: 60 mg/dL (ref >=46)
LDL Cholesterol: 222 mg/dL — ABNORMAL HIGH (ref 0–99)
Total CHOL/HDL Ratio: 5.4 Ratio
Triglycerides: 222 mg/dL — ABNORMAL HIGH (ref <150)
VLDL: 44 mg/dL — ABNORMAL HIGH (ref 0–40)

## 2014-09-04 LAB — POCT URINALYSIS DIPSTICK
Bilirubin, UA: NEGATIVE
Blood, UA: NEGATIVE
GLUCOSE UA: NEGATIVE
KETONES UA: NEGATIVE
Nitrite, UA: NEGATIVE
PROTEIN UA: NEGATIVE
SPEC GRAV UA: 1.01
UROBILINOGEN UA: 0.2
pH, UA: 5.5

## 2014-09-04 MED ORDER — LISINOPRIL 10 MG PO TABS: 10.0000 mg | ORAL_TABLET | Freq: Every day | ORAL | Status: DC

## 2014-09-04 NOTE — Progress Notes (Signed)
Subjective:    Patient ID: Katherine Mcpherson, female    DOB: 1945-05-04, 69 y.o.   MRN: [id number]  HPI Patient presents today for CPE. Her only complaint is chronic back pain for which she sees Drs. Voytec and Saullo. Pain exacerbated with walking. Is able to stand, sit, ride exercise bike. Sleeps well. Stays occupied with reading, Engineering geologist.   Last CPE- unknown Mammo- 12/21/13 Pap- 3 years ago, normal. Hysterectomy. Not sexually active.  Colonoscopy- 10/23/09 Tdap-12/30/11 Flu- annual Eye- regular Dental- regular Exercise- recumbent bike for level 11 for 30 minutes  immun utd  Past Medical History  Diagnosis Date  . Hypertension   . Polyp of colon 10/2009  . Allergy   . PONV (postoperative nausea and vomiting)   . GERD (gastroesophageal reflux disease)   . H/O hiatal hernia   . High cholesterol   . Chronic lower back pain     "ongoing since back OR 2013" (01/22/2013)  . Anemia     "comes and goes" (01/22/2013)  . Fall from slip, trip, or stumble 01/11/2013    "stubbed toe at gas pump at [place]" (01/22/2013)  . Arthritis     "qwhere" (01/22/2013)   Past Surgical History  Procedure Laterality Date  . Colonoscopy  08/2007    TUBULAR ADENOMAWITH DYSPLASIA  . Hammer toe surgery Right 2002  . Knee arthroscopy Left 2001  . Ankle fracture surgery Right 1985  . Posterior lumbar fusion  2013    L 5-S1  . Total knee arthroplasty Right 07/31/2012    Procedure: TOTAL KNEE ARTHROPLASTY;  Surgeon: Kerin Salen, MD;  Location: [facility];  Service: Orthopedics;  Laterality: Right;  DEPUY/SIGMA  . Foot fracture surgery Left 1986    "piece of bone removed" (08/03/2012)  . Ankle hardware removal Right 1986  . Abdominal hysterectomy  1999    TAH/BSO  . Total knee arthroplasty Left 01/22/2013  . Total knee arthroplasty Left 01/22/2013    Procedure: LEFT TOTAL KNEE ARTHROPLASTY;  Surgeon: Kerin Salen, MD;  Location: [facility];  Service: Orthopedics;  Laterality: Left;  . Fracture  surgery Bilateral   . Joint replacement    . Spine surgery  01/2012  . Radiofrequency ablation nerves      6 week ago 07/31/14   Family History  Problem Relation Age of Onset  . Osteoporosis Mother   . Hypertension Mother   . Hypertension Sister   . Hypertension Sister   . Cancer Paternal Uncle     PANCREATIC  . Heart disease Father   . Hypertension Father   . Hyperlipidemia Father    History  Substance Use Topics  . Smoking status: Former Smoker -- 0.50 packs/day for 8 years    Types: Cigarettes    Quit date: 03/15/1972  . Smokeless tobacco: Never Used  . Alcohol Use: Yes     Comment: 01/22/2013 "maybe 2 glasses of wine/month"     Review of Systems  Constitutional: Negative.   HENT: Negative.   Eyes: Negative.   Respiratory: Negative.   Cardiovascular: Negative.   Gastrointestinal: Negative.   Endocrine: Negative.   Genitourinary: Negative.   Musculoskeletal: Positive for back pain.  Skin: Negative.   Allergic/Immunologic: Positive for environmental allergies.  Neurological: Negative.   Hematological: Bruises/bleeds easily.  Psychiatric/Behavioral: Negative.        Objective:   Physical Exam Physical Exam  Constitutional: She is oriented to person, place, and time. She appears well-developed and well-nourished. No distress.  HENT:  Head: Normocephalic and atraumatic.  Right Ear: External ear normal.  Left Ear: External ear normal.  Nose: Nose normal.  Mouth/Throat: Oropharynx is clear and moist. No oropharyngeal exudate.  Eyes: Conjunctivae are normal. Pupils are equal, round, and reactive to light.  Neck: Normal range of motion. Neck supple. No JVD present. No thyromegaly present.  Cardiovascular: Normal rate, regular rhythm, normal heart sounds and intact distal pulses.   Pulmonary/Chest: Effort normal and breath sounds normal. Right breast exhibits no inverted nipple, no mass, no nipple discharge, no skin change and no tenderness. Left breast exhibits no  inverted nipple, no mass, no nipple discharge, no skin change and no tenderness. Breasts are symmetrical.  Abdominal: Soft. Bowel sounds are normal. She exhibits no distension and no mass. There is no tenderness. There is no rebound and no guarding.  Musculoskeletal: decreased ROM lumbar spine. She exhibits no edema or tenderness.  Lymphadenopathy:    She has no cervical adenopathy.  Neurological: She is alert and oriented to person, place, and time.  Skin: Skin is warm and dry. She is not diaphoretic.  Psychiatric: She has a normal mood and affect. Her behavior is normal. Judgment and thought content normal.  Vitals reviewed.  BP 140/80 mmHg  Pulse 73  Temp(Src) 98.7 F (37.1 C) (Oral)  Resp 16  Ht 5' 8.25" (1.734 m)  Wt 215 lb 12.8 oz (97.886 kg)  BMI 32.56 kg/m2  SpO2 98%     Assessment & Plan:  1. Annual physical exam  2. Essential hypertension - lisinopril (PRINIVIL,ZESTRIL) 10 MG tablet; Take 1 tablet (10 mg total) by mouth daily.  Dispense: 90 tablet; Refill: 3 - CBC - Comprehensive metabolic panel - Lipid panel  3. Obesity - Comprehensive metabolic panel - Lipid panel - provided mediterranean diet information and encouraged continued regular exercise  4. Screening for deficiency anemia - CBC  5. Screening for nephropathy - Comprehensive metabolic panel  6. Screening for hematuria or proteinuria - POCT urinalysis dipstick   Clarene Reamer, FNP-BC  [facility]  09/04/2014 10:06 PM I have participated in the care of this patient with the Advanced Practice Provider and agree with Diagnosis and Plan as documented. Robert P. Laney Pastor, M.D.

## 2014-09-04 NOTE — Patient Instructions (Signed)

## 2014-09-05 ENCOUNTER — Other Ambulatory Visit: Payer: Self-pay | Admitting: Family Medicine

## 2014-09-05 DIAGNOSIS — E785 Hyperlipidemia, unspecified: Secondary | ICD-10-CM

## 2014-09-05 MED ORDER — ATORVASTATIN CALCIUM 20 MG PO TABS: ORAL_TABLET | ORAL | Status: DC

## 2014-09-06 ENCOUNTER — Encounter: Payer: Self-pay | Admitting: Internal Medicine

## 2014-09-24 DIAGNOSIS — M961 Postlaminectomy syndrome, not elsewhere classified: Secondary | ICD-10-CM | POA: Diagnosis not present

## 2014-09-24 DIAGNOSIS — M545 Low back pain: Secondary | ICD-10-CM | POA: Diagnosis not present

## 2014-09-24 DIAGNOSIS — M47816 Spondylosis without myelopathy or radiculopathy, lumbar region: Secondary | ICD-10-CM | POA: Diagnosis not present

## 2014-10-08 DIAGNOSIS — M545 Low back pain: Secondary | ICD-10-CM | POA: Diagnosis not present

## 2014-10-08 DIAGNOSIS — M961 Postlaminectomy syndrome, not elsewhere classified: Secondary | ICD-10-CM | POA: Diagnosis not present

## 2014-10-08 DIAGNOSIS — M47816 Spondylosis without myelopathy or radiculopathy, lumbar region: Secondary | ICD-10-CM | POA: Diagnosis not present

## 2014-10-29 DIAGNOSIS — M545 Low back pain: Secondary | ICD-10-CM | POA: Diagnosis not present

## 2014-10-31 DIAGNOSIS — M47816 Spondylosis without myelopathy or radiculopathy, lumbar region: Secondary | ICD-10-CM | POA: Diagnosis not present

## 2014-10-31 DIAGNOSIS — M961 Postlaminectomy syndrome, not elsewhere classified: Secondary | ICD-10-CM | POA: Diagnosis not present

## 2014-10-31 DIAGNOSIS — M4126 Other idiopathic scoliosis, lumbar region: Secondary | ICD-10-CM | POA: Diagnosis not present

## 2014-11-12 DIAGNOSIS — M961 Postlaminectomy syndrome, not elsewhere classified: Secondary | ICD-10-CM | POA: Diagnosis not present

## 2014-11-12 DIAGNOSIS — M545 Low back pain: Secondary | ICD-10-CM | POA: Diagnosis not present

## 2014-11-12 DIAGNOSIS — M461 Sacroiliitis, not elsewhere classified: Secondary | ICD-10-CM | POA: Diagnosis not present

## 2014-11-20 DIAGNOSIS — M6281 Muscle weakness (generalized): Secondary | ICD-10-CM | POA: Diagnosis not present

## 2014-11-20 DIAGNOSIS — M545 Low back pain: Secondary | ICD-10-CM | POA: Diagnosis not present

## 2014-11-26 DIAGNOSIS — M545 Low back pain: Secondary | ICD-10-CM | POA: Diagnosis not present

## 2014-11-26 DIAGNOSIS — M6281 Muscle weakness (generalized): Secondary | ICD-10-CM | POA: Diagnosis not present

## 2014-11-28 DIAGNOSIS — M6281 Muscle weakness (generalized): Secondary | ICD-10-CM | POA: Diagnosis not present

## 2014-11-28 DIAGNOSIS — M545 Low back pain: Secondary | ICD-10-CM | POA: Diagnosis not present

## 2014-12-03 DIAGNOSIS — M6281 Muscle weakness (generalized): Secondary | ICD-10-CM | POA: Diagnosis not present

## 2014-12-03 DIAGNOSIS — M545 Low back pain: Secondary | ICD-10-CM | POA: Diagnosis not present

## 2014-12-05 DIAGNOSIS — M545 Low back pain: Secondary | ICD-10-CM | POA: Diagnosis not present

## 2014-12-05 DIAGNOSIS — M6281 Muscle weakness (generalized): Secondary | ICD-10-CM | POA: Diagnosis not present

## 2014-12-06 ENCOUNTER — Other Ambulatory Visit: Payer: Self-pay

## 2014-12-06 DIAGNOSIS — Z1231 Encounter for screening mammogram for malignant neoplasm of breast: Secondary | ICD-10-CM

## 2014-12-10 DIAGNOSIS — M545 Low back pain: Secondary | ICD-10-CM | POA: Diagnosis not present

## 2014-12-10 DIAGNOSIS — M6281 Muscle weakness (generalized): Secondary | ICD-10-CM | POA: Diagnosis not present

## 2014-12-12 DIAGNOSIS — M545 Low back pain: Secondary | ICD-10-CM | POA: Diagnosis not present

## 2014-12-12 DIAGNOSIS — M6281 Muscle weakness (generalized): Secondary | ICD-10-CM | POA: Diagnosis not present

## 2014-12-17 DIAGNOSIS — M545 Low back pain: Secondary | ICD-10-CM | POA: Diagnosis not present

## 2014-12-17 DIAGNOSIS — M6281 Muscle weakness (generalized): Secondary | ICD-10-CM | POA: Diagnosis not present

## 2014-12-19 DIAGNOSIS — M47816 Spondylosis without myelopathy or radiculopathy, lumbar region: Secondary | ICD-10-CM | POA: Diagnosis not present

## 2014-12-19 DIAGNOSIS — M6281 Muscle weakness (generalized): Secondary | ICD-10-CM | POA: Diagnosis not present

## 2014-12-19 DIAGNOSIS — M545 Low back pain: Secondary | ICD-10-CM | POA: Diagnosis not present

## 2014-12-19 DIAGNOSIS — M5136 Other intervertebral disc degeneration, lumbar region: Secondary | ICD-10-CM | POA: Diagnosis not present

## 2014-12-19 DIAGNOSIS — M4126 Other idiopathic scoliosis, lumbar region: Secondary | ICD-10-CM | POA: Diagnosis not present

## 2014-12-24 DIAGNOSIS — M545 Low back pain: Secondary | ICD-10-CM | POA: Diagnosis not present

## 2014-12-24 DIAGNOSIS — Z23 Encounter for immunization: Secondary | ICD-10-CM | POA: Diagnosis not present

## 2014-12-24 DIAGNOSIS — M6281 Muscle weakness (generalized): Secondary | ICD-10-CM | POA: Diagnosis not present

## 2014-12-26 DIAGNOSIS — M6281 Muscle weakness (generalized): Secondary | ICD-10-CM | POA: Diagnosis not present

## 2014-12-26 DIAGNOSIS — M545 Low back pain: Secondary | ICD-10-CM | POA: Diagnosis not present

## 2014-12-31 DIAGNOSIS — M545 Low back pain: Secondary | ICD-10-CM | POA: Diagnosis not present

## 2014-12-31 DIAGNOSIS — M6281 Muscle weakness (generalized): Secondary | ICD-10-CM | POA: Diagnosis not present

## 2014-12-31 DIAGNOSIS — K449 Diaphragmatic hernia without obstruction or gangrene: Secondary | ICD-10-CM | POA: Diagnosis not present

## 2014-12-31 DIAGNOSIS — Z86010 Personal history of colonic polyps: Secondary | ICD-10-CM | POA: Diagnosis not present

## 2014-12-31 DIAGNOSIS — K5900 Constipation, unspecified: Secondary | ICD-10-CM | POA: Diagnosis not present

## 2014-12-31 DIAGNOSIS — Z1211 Encounter for screening for malignant neoplasm of colon: Secondary | ICD-10-CM | POA: Diagnosis not present

## 2015-01-02 DIAGNOSIS — M6281 Muscle weakness (generalized): Secondary | ICD-10-CM | POA: Diagnosis not present

## 2015-01-02 DIAGNOSIS — M545 Low back pain: Secondary | ICD-10-CM | POA: Diagnosis not present

## 2015-01-03 ENCOUNTER — Ambulatory Visit
Admission: RE | Admit: 2015-01-03 | Discharge: 2015-01-03 | Disposition: A | Discharge status: Home / Self Care | Payer: Medicare Other | Source: Ambulatory Visit

## 2015-01-03 DIAGNOSIS — Z1231 Encounter for screening mammogram for malignant neoplasm of breast: Secondary | ICD-10-CM

## 2015-01-07 DIAGNOSIS — M545 Low back pain: Secondary | ICD-10-CM | POA: Diagnosis not present

## 2015-01-07 DIAGNOSIS — M6281 Muscle weakness (generalized): Secondary | ICD-10-CM | POA: Diagnosis not present

## 2015-01-09 DIAGNOSIS — M545 Low back pain: Secondary | ICD-10-CM | POA: Diagnosis not present

## 2015-01-09 DIAGNOSIS — M6281 Muscle weakness (generalized): Secondary | ICD-10-CM | POA: Diagnosis not present

## 2015-01-14 DIAGNOSIS — M6281 Muscle weakness (generalized): Secondary | ICD-10-CM | POA: Diagnosis not present

## 2015-01-14 DIAGNOSIS — M545 Low back pain: Secondary | ICD-10-CM | POA: Diagnosis not present

## 2015-01-16 DIAGNOSIS — Z1211 Encounter for screening for malignant neoplasm of colon: Secondary | ICD-10-CM | POA: Diagnosis not present

## 2015-01-16 DIAGNOSIS — M6281 Muscle weakness (generalized): Secondary | ICD-10-CM | POA: Diagnosis not present

## 2015-01-16 DIAGNOSIS — M545 Low back pain: Secondary | ICD-10-CM | POA: Diagnosis not present

## 2015-01-16 DIAGNOSIS — Z1212 Encounter for screening for malignant neoplasm of rectum: Secondary | ICD-10-CM | POA: Diagnosis not present

## 2015-01-22 DIAGNOSIS — M6281 Muscle weakness (generalized): Secondary | ICD-10-CM | POA: Diagnosis not present

## 2015-01-22 DIAGNOSIS — M7521 Bicipital tendinitis, right shoulder: Secondary | ICD-10-CM | POA: Diagnosis not present

## 2015-01-22 DIAGNOSIS — M76891 Other specified enthesopathies of right lower limb, excluding foot: Secondary | ICD-10-CM | POA: Diagnosis not present

## 2015-01-22 DIAGNOSIS — M76892 Other specified enthesopathies of left lower limb, excluding foot: Secondary | ICD-10-CM | POA: Diagnosis not present

## 2015-01-22 DIAGNOSIS — M545 Low back pain: Secondary | ICD-10-CM | POA: Diagnosis not present

## 2015-01-25 DIAGNOSIS — Z23 Encounter for immunization: Secondary | ICD-10-CM | POA: Diagnosis not present

## 2015-01-30 DIAGNOSIS — M791 Myalgia: Secondary | ICD-10-CM | POA: Diagnosis not present

## 2015-01-30 DIAGNOSIS — M47816 Spondylosis without myelopathy or radiculopathy, lumbar region: Secondary | ICD-10-CM | POA: Diagnosis not present

## 2015-01-30 DIAGNOSIS — M4126 Other idiopathic scoliosis, lumbar region: Secondary | ICD-10-CM | POA: Diagnosis not present

## 2015-01-30 DIAGNOSIS — M545 Low back pain: Secondary | ICD-10-CM | POA: Diagnosis not present

## 2015-01-30 DIAGNOSIS — M961 Postlaminectomy syndrome, not elsewhere classified: Secondary | ICD-10-CM | POA: Diagnosis not present

## 2015-02-10 ENCOUNTER — Encounter: Payer: Self-pay | Admitting: Internal Medicine

## 2015-04-03 DIAGNOSIS — M4126 Other idiopathic scoliosis, lumbar region: Secondary | ICD-10-CM | POA: Diagnosis not present

## 2015-04-03 DIAGNOSIS — M5136 Other intervertebral disc degeneration, lumbar region: Secondary | ICD-10-CM | POA: Diagnosis not present

## 2015-04-03 DIAGNOSIS — M791 Myalgia: Secondary | ICD-10-CM | POA: Diagnosis not present

## 2015-04-03 DIAGNOSIS — M47816 Spondylosis without myelopathy or radiculopathy, lumbar region: Secondary | ICD-10-CM | POA: Diagnosis not present

## 2015-04-21 ENCOUNTER — Ambulatory Visit (INDEPENDENT_AMBULATORY_CARE_PROVIDER_SITE_OTHER): Payer: Medicare Other | Admitting: Family Medicine

## 2015-04-21 VITALS — BP 171/92 | HR 65 | Temp 98.0°F | Resp 16 | Ht 69.0 in | Wt 219.0 lb

## 2015-04-21 DIAGNOSIS — R29898 Other symptoms and signs involving the musculoskeletal system: Secondary | ICD-10-CM | POA: Diagnosis not present

## 2015-04-21 DIAGNOSIS — L989 Disorder of the skin and subcutaneous tissue, unspecified: Secondary | ICD-10-CM | POA: Diagnosis not present

## 2015-04-21 DIAGNOSIS — J3489 Other specified disorders of nose and nasal sinuses: Secondary | ICD-10-CM

## 2015-04-21 LAB — CBC
HCT: 41.1 % (ref 36.0–46.0)
HEMOGLOBIN: 13.5 g/dL (ref 12.0–15.0)
MCH: 29.5 pg (ref 26.0–34.0)
MCHC: 32.8 g/dL (ref 30.0–36.0)
MCV: 89.9 fL (ref 78.0–100.0)
MPV: 10.5 fL (ref 8.6–12.4)
Platelets: 327 10*3/uL (ref 150–400)
RBC: 4.57 MIL/uL (ref 3.87–5.11)
RDW: 13.2 % (ref 11.5–15.5)
WBC: 10.1 10*3/uL (ref 4.0–10.5)

## 2015-04-21 LAB — COMPREHENSIVE METABOLIC PANEL
ALBUMIN: 4 g/dL (ref 3.6–5.1)
ALT: 11 U/L (ref 6–29)
AST: 14 U/L (ref 10–35)
Alkaline Phosphatase: 64 U/L (ref 33–130)
BUN: 23 mg/dL (ref 7–25)
CHLORIDE: 105 mmol/L (ref 98–110)
CO2: 26 mmol/L (ref 20–31)
CREATININE: 0.97 mg/dL — AB (ref 0.60–0.93)
Calcium: 9.1 mg/dL (ref 8.6–10.4)
Glucose, Bld: 97 mg/dL (ref 65–99)
POTASSIUM: 4.7 mmol/L (ref 3.5–5.3)
SODIUM: 138 mmol/L (ref 135–146)
Total Bilirubin: 0.5 mg/dL (ref 0.2–1.2)
Total Protein: 6.6 g/dL (ref 6.1–8.1)

## 2015-04-21 LAB — CK: Total CK: 58 U/L (ref 7–177)

## 2015-04-21 MED ORDER — TRIAMCINOLONE ACETONIDE 55 MCG/ACT NA AERO: 2.0000 | INHALATION_SPRAY | Freq: Every day | NASAL | Status: DC

## 2015-04-21 NOTE — Progress Notes (Signed)
Patient ID: Katherine Mcpherson, female    DOB: January 12, 1946  Age: 70 y.o. MRN: [id number]  Chief Complaint  Patient presents with  . Sinus Problem  . Extremity Weakness    legs, knees    Subjective:   Patient is here for a couple of things today. She has been having head congestion for the last 3 months with a pressure in her sinus area just below her eyes. She does not smoke. She does have several cats. She is intermittently used antihistamines and has been using an equate nose spray.  She has had 2 episodes, first time 3 weeks ago and again this morning, when she just felt like her legs got a jellylike weakness. She had to sit down rather than fall down. Knows of no protruding fracture. No pain. She has had problems with her back and has had knee replacements and foot surgery in the past, but these episodes are distinctly different.  Third thing is she has a spot on her back shortly check  Current allergies, medications, problem list, past/family and social histories reviewed.  Objective:  BP 171/92 mmHg  Pulse 65  Temp(Src) 98 F (36.7 C)  Resp 16  Ht 5\' 9"  (1.753 m)  Wt 219 lb (99.338 kg)  BMI 32.33 kg/m2  Small caf au lait the type pigmented area 1 cm in diameter on the left scapula, very benign in appearance.  TMs are normal. Nose clear. Throat clear. Sinuses nontender. Neck supple without nodes. Chest clear. Heart regular without murmurs. Straight leg raising test negative. Gait normal. Leg strength is good. Pulse is good. Sensory grossly normal.  Assessment & Plan:   Assessment: 1. Leg weakness, bilateral   2. Sinus pressure   3. Benign skin lesion       Plan: I'm not sure what's causing the leg weakness. Will check some basic chemistries to make sure metabolic aspects are intact.  Nasacort. If she keeps having the sinus congestion may need to send to an allergist.  Orders Placed This Encounter  Procedures  . CBC  . Comprehensive metabolic panel  . CK     Meds ordered this encounter  Medications  . triamcinolone (NASACORT AQ) 55 MCG/ACT AERO nasal inhaler    Sig: Place 2 sprays into the nose daily.    Dispense:  1 Inhaler    Refill:  12         Patient Instructions  Use the Nasacort 2 sprays each nostril twice daily for 4 or 5 days, then decrease to once daily  Discontinue using the over-the-counter nose spray  It is good to take something like a Claritin or Allegra 1 daily  If nasal congestion persists we may need to consider getting a CT scan of your sinuses and/or send you to an allergist or ENT.  If you have further episodes of the leg weakness please return. Continue the exercising, and do so more frequently if possible.     Return if symptoms worsen or fail to improve.   Tyanna Hach, MD 04/21/2015

## 2015-04-21 NOTE — Patient Instructions (Signed)
Use the Nasacort 2 sprays each nostril twice daily for 4 or 5 days, then decrease to once daily  Discontinue using the over-the-counter nose spray  It is good to take something like a Claritin or Allegra 1 daily  If nasal congestion persists we may need to consider getting a CT scan of your sinuses and/or send you to an allergist or ENT.  If you have further episodes of the leg weakness please return. Continue the exercising, and do so more frequently if possible.

## 2015-04-30 ENCOUNTER — Encounter: Payer: Self-pay | Admitting: Internal Medicine

## 2015-05-03 ENCOUNTER — Other Ambulatory Visit: Payer: Self-pay | Admitting: Internal Medicine

## 2015-05-03 DIAGNOSIS — R0981 Nasal congestion: Secondary | ICD-10-CM

## 2015-05-21 DIAGNOSIS — R439 Unspecified disturbances of smell and taste: Secondary | ICD-10-CM | POA: Diagnosis not present

## 2015-05-21 DIAGNOSIS — R0981 Nasal congestion: Secondary | ICD-10-CM | POA: Diagnosis not present

## 2015-06-05 DIAGNOSIS — M791 Myalgia: Secondary | ICD-10-CM | POA: Diagnosis not present

## 2015-06-05 DIAGNOSIS — M5136 Other intervertebral disc degeneration, lumbar region: Secondary | ICD-10-CM | POA: Diagnosis not present

## 2015-06-05 DIAGNOSIS — M4126 Other idiopathic scoliosis, lumbar region: Secondary | ICD-10-CM | POA: Diagnosis not present

## 2015-06-05 DIAGNOSIS — M7062 Trochanteric bursitis, left hip: Secondary | ICD-10-CM | POA: Diagnosis not present

## 2015-06-05 DIAGNOSIS — M47816 Spondylosis without myelopathy or radiculopathy, lumbar region: Secondary | ICD-10-CM | POA: Diagnosis not present

## 2015-06-09 DIAGNOSIS — H11153 Pinguecula, bilateral: Secondary | ICD-10-CM | POA: Diagnosis not present

## 2015-06-09 DIAGNOSIS — H25013 Cortical age-related cataract, bilateral: Secondary | ICD-10-CM | POA: Diagnosis not present

## 2015-06-09 DIAGNOSIS — H43393 Other vitreous opacities, bilateral: Secondary | ICD-10-CM | POA: Diagnosis not present

## 2015-07-03 DIAGNOSIS — M7062 Trochanteric bursitis, left hip: Secondary | ICD-10-CM | POA: Diagnosis not present

## 2015-07-03 DIAGNOSIS — M47816 Spondylosis without myelopathy or radiculopathy, lumbar region: Secondary | ICD-10-CM | POA: Diagnosis not present

## 2015-07-03 DIAGNOSIS — M545 Low back pain: Secondary | ICD-10-CM | POA: Diagnosis not present

## 2015-07-03 DIAGNOSIS — M791 Myalgia: Secondary | ICD-10-CM | POA: Diagnosis not present

## 2015-07-03 DIAGNOSIS — M5136 Other intervertebral disc degeneration, lumbar region: Secondary | ICD-10-CM | POA: Diagnosis not present

## 2015-08-21 DIAGNOSIS — S8001XA Contusion of right knee, initial encounter: Secondary | ICD-10-CM | POA: Diagnosis not present

## 2015-08-21 DIAGNOSIS — Z96653 Presence of artificial knee joint, bilateral: Secondary | ICD-10-CM | POA: Diagnosis not present

## 2015-08-25 ENCOUNTER — Ambulatory Visit (INDEPENDENT_AMBULATORY_CARE_PROVIDER_SITE_OTHER): Payer: Medicare Other | Admitting: Physician Assistant

## 2015-08-25 VITALS — BP 138/78 | HR 68 | Temp 98.2°F | Resp 16 | Ht 68.0 in | Wt 218.0 lb

## 2015-08-25 DIAGNOSIS — Z1159 Encounter for screening for other viral diseases: Secondary | ICD-10-CM

## 2015-08-25 DIAGNOSIS — R252 Cramp and spasm: Secondary | ICD-10-CM

## 2015-08-25 DIAGNOSIS — I10 Essential (primary) hypertension: Secondary | ICD-10-CM

## 2015-08-25 DIAGNOSIS — E785 Hyperlipidemia, unspecified: Secondary | ICD-10-CM

## 2015-08-25 LAB — COMPREHENSIVE METABOLIC PANEL
ALK PHOS: 59 U/L (ref 33–130)
ALT: 15 U/L (ref 6–29)
AST: 17 U/L (ref 10–35)
Albumin: 4.2 g/dL (ref 3.6–5.1)
BUN: 25 mg/dL (ref 7–25)
CALCIUM: 9.8 mg/dL (ref 8.6–10.4)
CHLORIDE: 106 mmol/L (ref 98–110)
CO2: 23 mmol/L (ref 20–31)
Creat: 1.03 mg/dL — ABNORMAL HIGH (ref 0.60–0.93)
GLUCOSE: 89 mg/dL (ref 65–99)
POTASSIUM: 4.8 mmol/L (ref 3.5–5.3)
Sodium: 140 mmol/L (ref 135–146)
Total Bilirubin: 0.5 mg/dL (ref 0.2–1.2)
Total Protein: 6.6 g/dL (ref 6.1–8.1)

## 2015-08-25 LAB — CBC WITH DIFFERENTIAL/PLATELET
BASOS ABS: 69 {cells}/uL (ref 0–200)
Basophils Relative: 1 %
EOS ABS: 276 {cells}/uL (ref 15–500)
Eosinophils Relative: 4 %
HEMATOCRIT: 41.8 % (ref 35.0–45.0)
Hemoglobin: 13.9 g/dL (ref 11.7–15.5)
LYMPHS PCT: 25 %
Lymphs Abs: 1725 cells/uL (ref 850–3900)
MCH: 29.6 pg (ref 27.0–33.0)
MCHC: 33.3 g/dL (ref 32.0–36.0)
MCV: 89.1 fL (ref 80.0–100.0)
MONO ABS: 552 {cells}/uL (ref 200–950)
MONOS PCT: 8 %
MPV: 10.8 fL (ref 7.5–12.5)
NEUTROS PCT: 62 %
Neutro Abs: 4278 cells/uL (ref 1500–7800)
PLATELETS: 364 10*3/uL (ref 140–400)
RBC: 4.69 MIL/uL (ref 3.80–5.10)
RDW: 13.9 % (ref 11.0–15.0)
WBC: 6.9 10*3/uL (ref 3.8–10.8)

## 2015-08-25 LAB — LIPID PANEL
CHOL/HDL RATIO: 3 ratio (ref <=5.0)
Cholesterol: 271 mg/dL — ABNORMAL HIGH (ref 125–200)
HDL: 90 mg/dL (ref >=46)
LDL Cholesterol: 166 mg/dL — ABNORMAL HIGH (ref <130)
TRIGLYCERIDES: 75 mg/dL (ref <150)
VLDL: 15 mg/dL (ref <30)

## 2015-08-25 LAB — HEPATITIS C ANTIBODY: HCV AB: NEGATIVE

## 2015-08-25 LAB — MAGNESIUM: MAGNESIUM: 2.2 mg/dL (ref 1.5–2.5)

## 2015-08-25 MED ORDER — LISINOPRIL 10 MG PO TABS: 10.0000 mg | ORAL_TABLET | Freq: Every day | ORAL | Status: DC

## 2015-08-25 NOTE — Progress Notes (Signed)
Patient ID: Katherine Mcpherson, female    DOB: Jul 08, 1945, 70 y.o.   MRN: [id number]  PCP: Leandrew Koyanagi, MD  Subjective:   Chief Complaint  Patient presents with  . Medication Refill    LISINOPRIL    HPI Presents for medication refill.  She tolerates lisinopril well, without adverse effects. Exercises 3x/week at the gym and recently returned from a trip to [place] where she did a lot of walking and hiking.  RIGHT ankle and inner thigh cramps x 3 months. Mustard helps when they happen.  Is fasting today in preparation for cholesterol update-elevated at last check 1 year ago. She's resumed an OTC Cholestoff product that worked for her previously. She is unwilling to take statins due to her chronic joint pain, "I don't need any more pains." Is trying to stop the Diclofenac (last dose 3 days ago), and is trying tumeric. Uses oxycodone only when the pain is unbearable.    Review of Systems No chest pain, SOB, HA, dizziness, vision change, N/V, diarrhea, constipation, dysuria, urinary urgency or frequency, new myalgias, new arthralgias or rash.     Patient Active Problem List   Diagnosis Date Noted  . Osteoarthritis of left knee 01/22/2013  . Osteoarthritis of right knee 07/31/2012  . HTN (hypertension) 12/30/2011  . Overweight(278.02) 12/30/2011  . Vaginal atrophy 12/30/2011  . Menopausal state 12/30/2011  . Low back pain 12/15/2011     Prior to Admission medications   Medication Sig Start Date End Date Taking? Authorizing Provider  acetaminophen (TYLENOL) 650 MG CR tablet Take 650 mg by mouth 2 (two) times daily as needed for pain.   Yes Historical Provider, MD  carisoprodol (SOMA) 350 MG tablet Take 1 tablet (350 mg total) by mouth 3 (three) times daily as needed for muscle spasms. 01/22/13  Yes Leighton Parody, PA-C  GARLIC PO Take by mouth.   Yes Historical Provider, MD  lisinopril (PRINIVIL,ZESTRIL) 10 MG tablet Take 1 tablet (10 mg total) by mouth daily.  09/04/14  Yes Elby Beck, FNP  loratadine (CLARITIN) 10 MG tablet Take 10 mg by mouth daily as needed for allergies.   Yes Historical Provider, MD  OVER THE COUNTER MEDICATION Take 2 capsules by mouth daily. CholestOff   Yes Historical Provider, MD  oxycodone (OXY-IR) 5 MG capsule Take 5 mg by mouth every 4 (four) hours as needed for pain.   Yes Historical Provider, MD  TURMERIC PO Take by mouth.   Yes Historical Provider, MD  B Complex Vitamins (VITAMIN-B COMPLEX) TABS Take by mouth.    Historical Provider, MD  diclofenac (VOLTAREN) 75 MG EC tablet Take 75 mg by mouth daily. Reported on 08/25/2015    Historical Provider, MD  Famotidine (PEPCID PO) Take by mouth.    Historical Provider, MD  triamcinolone (NASACORT AQ) 55 MCG/ACT AERO nasal inhaler Place 2 sprays into the nose daily. Patient not taking: Reported on 08/25/2015 04/21/15   Posey Boyer, MD     Allergies  Allergen Reactions  . Sulfa Antibiotics Other (See Comments)    Makes tongue feel funny and makes her feel like she will pass  Out.  Last for days.  . Penicillins Itching    Childhood allergy.   . Latex Rash    Had rash on lft side only after back surgery       Objective:  Physical Exam  Constitutional: She is oriented to person, place, and time. She appears well-developed and well-nourished. She is active  and cooperative. No distress.  BP 138/78 mmHg  Pulse 68  Temp(Src) 98.2 F (36.8 C) (Oral)  Resp 16  Ht 5\' 8"  (1.727 m)  Wt 218 lb (98.884 kg)  BMI 33.15 kg/m2  SpO2 96%  HENT:  Head: Normocephalic and atraumatic.  Right Ear: Hearing normal.  Left Ear: Hearing normal.  Eyes: Conjunctivae are normal. No scleral icterus.  Neck: Normal range of motion. Neck supple. No thyromegaly present.  Cardiovascular: Normal rate, regular rhythm and normal heart sounds.   Pulses:      Radial pulses are 2+ on the right side, and 2+ on the left side.  Pulmonary/Chest: Effort normal and breath sounds normal.    Lymphadenopathy:       Head (right side): No tonsillar, no preauricular, no posterior auricular and no occipital adenopathy present.       Head (left side): No tonsillar, no preauricular, no posterior auricular and no occipital adenopathy present.    She has no cervical adenopathy.       Right: No supraclavicular adenopathy present.       Left: No supraclavicular adenopathy present.  Neurological: She is alert and oriented to person, place, and time. No sensory deficit.  Skin: Skin is warm, dry and intact. No rash noted. No cyanosis or erythema. Nails show no clubbing.  Psychiatric: She has a normal mood and affect. Her speech is normal and behavior is normal.           Assessment & Plan:   1. Essential hypertension Controlled. Continue lisinopril. - CBC with Differential/Platelet - Comprehensive metabolic panel - lisinopril (PRINIVIL,ZESTRIL) 10 MG tablet; Take 1 tablet (10 mg total) by mouth daily.  Dispense: 90 tablet; Refill: 3  2. Nocturnal muscle cramps OK to continue mustard PRN.  - Magnesium  3. Hyperlipidemia Await lab results. Will not consider statin therapy. - Lipid panel  4. Need for hepatitis C screening test - Hepatitis C antibody   Return in about 6 months (around 02/24/2016) for re-evaluation of blood pressure. She will stay with this practice following Dr. Ninfa Meeker retirement next month and prefers to see Dr. Brigitte Pulse. I am also happy to see her in the future.   Fara Chute, PA-C Physician Assistant-Certified [facility]

## 2015-08-25 NOTE — Patient Instructions (Signed)
     IF you received an x-ray today, you will receive an invoice from [facility]. Please contact [facility] at [phone] with questions or concerns regarding your invoice.   IF you received labwork today, you will receive an invoice from [facility]. Please contact [facility] at [phone] with questions or concerns regarding your invoice.   Our billing staff will not be able to assist you with questions regarding bills from these companies.  You will be contacted with the lab results as soon as they are available. The fastest way to get your results is to activate your My Chart account. Instructions are located on the last page of this paperwork. If you have not heard from us regarding the results in 2 weeks, please contact this office.      

## 2015-09-03 DIAGNOSIS — Z6841 Body Mass Index (BMI) 40.0 and over, adult: Secondary | ICD-10-CM | POA: Diagnosis not present

## 2015-09-03 DIAGNOSIS — M47816 Spondylosis without myelopathy or radiculopathy, lumbar region: Secondary | ICD-10-CM | POA: Diagnosis not present

## 2015-09-03 DIAGNOSIS — M791 Myalgia: Secondary | ICD-10-CM | POA: Diagnosis not present

## 2015-09-03 DIAGNOSIS — M7062 Trochanteric bursitis, left hip: Secondary | ICD-10-CM | POA: Diagnosis not present

## 2015-10-02 DIAGNOSIS — M7062 Trochanteric bursitis, left hip: Secondary | ICD-10-CM | POA: Diagnosis not present

## 2015-10-02 DIAGNOSIS — M47817 Spondylosis without myelopathy or radiculopathy, lumbosacral region: Secondary | ICD-10-CM | POA: Diagnosis not present

## 2015-10-02 DIAGNOSIS — M791 Myalgia: Secondary | ICD-10-CM | POA: Diagnosis not present

## 2015-10-02 DIAGNOSIS — M545 Low back pain: Secondary | ICD-10-CM | POA: Diagnosis not present

## 2015-10-14 DIAGNOSIS — M5136 Other intervertebral disc degeneration, lumbar region: Secondary | ICD-10-CM | POA: Diagnosis not present

## 2015-10-14 DIAGNOSIS — M533 Sacrococcygeal disorders, not elsewhere classified: Secondary | ICD-10-CM | POA: Diagnosis not present

## 2015-10-30 DIAGNOSIS — M791 Myalgia: Secondary | ICD-10-CM | POA: Diagnosis not present

## 2015-10-30 DIAGNOSIS — M7062 Trochanteric bursitis, left hip: Secondary | ICD-10-CM | POA: Diagnosis not present

## 2015-10-30 DIAGNOSIS — M5136 Other intervertebral disc degeneration, lumbar region: Secondary | ICD-10-CM | POA: Diagnosis not present

## 2015-10-30 DIAGNOSIS — M545 Low back pain: Secondary | ICD-10-CM | POA: Diagnosis not present

## 2015-11-06 ENCOUNTER — Other Ambulatory Visit: Payer: Self-pay | Admitting: Rehabilitation

## 2015-11-06 DIAGNOSIS — M545 Low back pain: Secondary | ICD-10-CM

## 2015-11-16 ENCOUNTER — Ambulatory Visit
Admission: RE | Admit: 2015-11-16 | Discharge: 2015-11-16 | Disposition: A | Discharge status: Home / Self Care | Payer: Medicare Other | Source: Ambulatory Visit | Attending: Rehabilitation | Admitting: Rehabilitation

## 2015-11-16 DIAGNOSIS — M545 Low back pain: Secondary | ICD-10-CM

## 2015-11-16 DIAGNOSIS — M4806 Spinal stenosis, lumbar region: Secondary | ICD-10-CM | POA: Diagnosis not present

## 2015-11-16 MED ORDER — GADOBENATE DIMEGLUMINE 529 MG/ML IV SOLN
20.0000 mL | Freq: Once | INTRAVENOUS | Status: AC | PRN
  Administered 2015-11-16: 20 mL via INTRAVENOUS

## 2015-11-19 DIAGNOSIS — M5136 Other intervertebral disc degeneration, lumbar region: Secondary | ICD-10-CM | POA: Diagnosis not present

## 2015-11-19 DIAGNOSIS — M545 Low back pain: Secondary | ICD-10-CM | POA: Diagnosis not present

## 2015-11-19 DIAGNOSIS — M791 Myalgia: Secondary | ICD-10-CM | POA: Diagnosis not present

## 2015-11-19 DIAGNOSIS — M47816 Spondylosis without myelopathy or radiculopathy, lumbar region: Secondary | ICD-10-CM | POA: Diagnosis not present

## 2015-12-02 DIAGNOSIS — M5136 Other intervertebral disc degeneration, lumbar region: Secondary | ICD-10-CM | POA: Diagnosis not present

## 2015-12-03 ENCOUNTER — Ambulatory Visit (INDEPENDENT_AMBULATORY_CARE_PROVIDER_SITE_OTHER): Payer: Medicare Other

## 2015-12-03 DIAGNOSIS — Z23 Encounter for immunization: Secondary | ICD-10-CM | POA: Diagnosis not present

## 2015-12-08 ENCOUNTER — Ambulatory Visit (INDEPENDENT_AMBULATORY_CARE_PROVIDER_SITE_OTHER): Payer: Medicare Other | Admitting: Physician Assistant

## 2015-12-08 ENCOUNTER — Telehealth: Payer: Self-pay | Admitting: *Deleted

## 2015-12-08 VITALS — BP 124/82 | HR 82 | Temp 97.9°F | Resp 17 | Ht 68.0 in | Wt 218.0 lb

## 2015-12-08 DIAGNOSIS — R350 Frequency of micturition: Secondary | ICD-10-CM

## 2015-12-08 DIAGNOSIS — M545 Low back pain: Secondary | ICD-10-CM | POA: Diagnosis not present

## 2015-12-08 DIAGNOSIS — N390 Urinary tract infection, site not specified: Secondary | ICD-10-CM | POA: Diagnosis not present

## 2015-12-08 DIAGNOSIS — M25552 Pain in left hip: Secondary | ICD-10-CM

## 2015-12-08 DIAGNOSIS — M62830 Muscle spasm of back: Secondary | ICD-10-CM | POA: Diagnosis not present

## 2015-12-08 LAB — POCT URINALYSIS DIP (MANUAL ENTRY)
BILIRUBIN UA: NEGATIVE
GLUCOSE UA: NEGATIVE
Ketones, POC UA: NEGATIVE
NITRITE UA: NEGATIVE
Protein Ur, POC: NEGATIVE
RBC UA: NEGATIVE
Spec Grav, UA: 1.015
UROBILINOGEN UA: 0.2
pH, UA: 5.5

## 2015-12-08 LAB — POC MICROSCOPIC URINALYSIS (UMFC): MUCUS RE: ABSENT

## 2015-12-08 MED ORDER — CEPHALEXIN 500 MG PO CAPS: 500.0000 mg | ORAL_CAPSULE | Freq: Three times a day (TID) | ORAL | 0 refills | Status: AC

## 2015-12-08 MED ORDER — NITROFURANTOIN MONOHYD MACRO 100 MG PO CAPS: 100.0000 mg | ORAL_CAPSULE | Freq: Two times a day (BID) | ORAL | 0 refills | Status: DC

## 2015-12-08 NOTE — Progress Notes (Signed)
Katherine Mcpherson  MRN: [id number] DOB: 1945-07-13  Subjective:  Katherine Mcpherson is a 70 y.o. female seen in office today for a chief complaint of bilateral back pain x 6 weeks and left hip pain x a few months. She had tried steroid injection in the low back one week ago but did not have any relief from it. She had xray of hips recently and they were normal. Back pain is worsened with movement and hip pain is constant with intermittent pain down lateral aspect of left thigh.   Denies recent injury, radiculopathy, urinary/bowel incontinence, saddle anesthesia, numbness, tingling, loss of sensation, and decreased ROM.   Has tried intermittent soma and tylenol daily with minimal relief. She uses oxycodone at night which does help with sleep. Has tried ice intermittently for hip pain and notes that it does provide some relief.    Has extensive history of low back pain x 7 years. Had a lumbar back fusion 4 years ago. She sees Dr. Maia Petties every 4-6 weeks for this issue. She has an appointment with him on 12/23/15.   Has tried PT in the past for her back and did benefit her (it is called breakthrough PT). Has not tried it for the hip.   Of note,pt goes to the gym three times a week and performs both leg and upper arms workouts.  Pt just wants to know if we have anymore suggestions for ways she can find relief until she sees Dr. Maia Petties on 12/23/2015.  Review of Systems  Constitutional: Negative for chills and fever.  Genitourinary: Positive for dysuria and frequency. Negative for flank pain, hematuria and pelvic pain.  Musculoskeletal: Positive for gait problem (baseline). Negative for neck pain and neck stiffness.  Neurological: Positive for headaches. Negative for weakness.    Patient Active Problem List   Diagnosis Date Noted  . Osteoarthritis of left knee 01/22/2013  . Osteoarthritis of right knee 07/31/2012  . HTN (hypertension) 12/30/2011  . Overweight(278.02) 12/30/2011  . Vaginal  atrophy 12/30/2011  . Menopausal state 12/30/2011  . Low back pain 12/15/2011    Current Outpatient Prescriptions on File Prior to Visit  Medication Sig Dispense Refill  . acetaminophen (TYLENOL) 650 MG CR tablet Take 650 mg by mouth 2 (two) times daily as needed for pain.    . B Complex Vitamins (VITAMIN-B COMPLEX) TABS Take by mouth.    . carisoprodol (SOMA) 350 MG tablet Take 1 tablet (350 mg total) by mouth 3 (three) times daily as needed for muscle spasms. 60 tablet 0  . Famotidine (PEPCID PO) Take by mouth.    Marland Kitchen GARLIC PO Take by mouth.    Marland Kitchen lisinopril (PRINIVIL,ZESTRIL) 10 MG tablet Take 1 tablet (10 mg total) by mouth daily. 90 tablet 3  . loratadine (CLARITIN) 10 MG tablet Take 10 mg by mouth daily as needed for allergies.    Marland Kitchen OVER THE COUNTER MEDICATION Take 2 capsules by mouth daily. CholestOff    . oxycodone (OXY-IR) 5 MG capsule Take 5 mg by mouth every 4 (four) hours as needed for pain.    Marland Kitchen triamcinolone (NASACORT AQ) 55 MCG/ACT AERO nasal inhaler Place 2 sprays into the nose daily. 1 Inhaler 12  . TURMERIC PO Take by mouth.    . diclofenac (VOLTAREN) 75 MG EC tablet Take 75 mg by mouth daily. Reported on 08/25/2015     No current facility-administered medications on file prior to visit.     Allergies  Allergen Reactions  .  Sulfa Antibiotics Other (See Comments)    Makes tongue feel funny and makes her feel like she will pass  Out.  Last for days.  . Penicillins Itching    Childhood allergy.   . Latex Rash    Had rash on lft side only after back surgery   Past Surgical History:  Procedure Laterality Date  . ABDOMINAL HYSTERECTOMY  1999   TAH/BSO  . ANKLE FRACTURE SURGERY Right 1985  . ANKLE HARDWARE REMOVAL Right 1986  . COLONOSCOPY  08/2007   TUBULAR ADENOMAWITH DYSPLASIA  . FOOT FRACTURE SURGERY Left 1986   "piece of bone removed" (08/03/2012)  . FRACTURE SURGERY Bilateral   . HAMMER TOE SURGERY Right 2002  . JOINT REPLACEMENT    . KNEE ARTHROSCOPY Left  2001  . POSTERIOR LUMBAR FUSION  2013   L 5-S1  . RADIOFREQUENCY ABLATION NERVES     6 week ago 07/31/14  . SPINE SURGERY  01/2012  . TOTAL KNEE ARTHROPLASTY Right 07/31/2012   Procedure: TOTAL KNEE ARTHROPLASTY;  Surgeon: Kerin Salen, MD;  Location: [facility];  Service: Orthopedics;  Laterality: Right;  DEPUY/SIGMA  . TOTAL KNEE ARTHROPLASTY Left 01/22/2013  . TOTAL KNEE ARTHROPLASTY Left 01/22/2013   Procedure: LEFT TOTAL KNEE ARTHROPLASTY;  Surgeon: Kerin Salen, MD;  Location: [facility];  Service: Orthopedics;  Laterality: Left;    Objective:  BP 124/82 (BP Location: Right Arm, Patient Position: Sitting, Cuff Size: Large)   Pulse 82   Temp 97.9 F (36.6 C) (Oral)   Resp 17   Ht 5\' 8"  (1.727 m)   Wt 218 lb (98.9 kg)   SpO2 99%   BMI 33.15 kg/m   Physical Exam  Constitutional: She is oriented to person, place, and time and well-developed, well-nourished, and in no distress.  HENT:  Head: Normocephalic and atraumatic.  Eyes: Conjunctivae are normal.  Neck: Normal range of motion.  Pulmonary/Chest: Effort normal.  Musculoskeletal:       Right hip: Normal.       Left hip: She exhibits tenderness ( directly over greater trochanter ). She exhibits normal range of motion and normal strength.       Cervical back: She exhibits no tenderness.       Thoracic back: She exhibits no bony tenderness.       Lumbar back: She exhibits decreased range of motion, tenderness (bilateral muscle) and spasm (along palpation of right latisimus dorsi). She exhibits no bony tenderness.       Right upper leg: Normal.       Left upper leg: Normal.  Scoliosis noted.  Neurological: She is alert and oriented to person, place, and time. She has normal sensation. She has a normal Straight Leg Raise Test. Gait normal.  Reflex Scores:      Patellar reflexes are 2+ on the right side and 2+ on the left side.      Achilles reflexes are 2+ on the right side and 2+ on the left side. Skin: Skin is warm and dry.    Psychiatric: Affect normal.  Vitals reviewed.      Results for orders placed or performed in visit on 12/08/15 (from the past 24 hour(s))  POCT Microscopic Urinalysis ([facility])     Status: Abnormal   Collection Time: 12/08/15  4:05 PM  Result Value Ref Range   WBC,UR,HPF,POC Moderate (A) None WBC/hpf   RBC,UR,HPF,POC None None RBC/hpf   Bacteria None None, Too numerous to count   Mucus Absent Absent  Epithelial Cells, UR Per Microscopy None None, Too numerous to count cells/hpf  POCT urinalysis dipstick     Status: Abnormal   Collection Time: 12/08/15  4:05 PM  Result Value Ref Range   Color, UA yellow yellow   Clarity, UA cloudy (A) clear   Glucose, UA negative negative   Bilirubin, UA negative negative   Ketones, POC UA negative negative   Spec Grav, UA 1.015    Blood, UA negative negative   pH, UA 5.5    Protein Ur, POC negative negative   Urobilinogen, UA 0.2    Nitrite, UA Negative Negative   Leukocytes, UA small (1+) (A) Negative    Assessment and Plan :  1. Bilateral low back pain, with sciatica presence unspecified - Ambulatory referral to Physical Therapy 2. Hip pain, left - Ambulatory referral to Physical Therapy -Instructed to increase tylenol to 1000mg  TID -Use heat on affected areas daily -Stretches given to pt -Instructed that if hip pain still present in one week after conservative treatment return to office for possible steroid injection at greater trochanter bursa.   3. Back spasm -Instructed to use prescribed Soma TID daily for the next week   4. Urinary frequency - POCT Microscopic Urinalysis ([facility]) - POCT urinalysis dipstick - Urine culture  5. Urinary tract infection, site not specified - cephALEXin (KEFLEX) 500 MG capsule; Take 1 capsule (500 mg total) by mouth 3 (three) times daily.  Dispense: 21 capsule; Refill: 0 -Although pt has penicillin listed as an allergy, she has had cephalosporins in the past in 2014 per EPIC and they are not listed  as an allergy, therefore assumming it is okay to use keflex. If pt has any side effects of itching or other severe rxn to keflex she has been instructed to stop taking immediately and we will change medication to ciprofloxacin. Pt understands and agrees to treatment.   Tenna Delaine PA-C  [facility] 12/08/2015 4:14 PM

## 2015-12-08 NOTE — Patient Instructions (Addendum)
Use heating pad to affected areas 4-5 times a day for 30 minutes at a time. Discontinue lifting at the gym until pain subsides in hip. Start doing these exercises below for hip pain once pain is not as severe.   I would take soma three times a day every day for the next week. Also increase tylenol to 650mg  every 6 hours or 1000mg  every 8 hours.  Follow up in one week for hip injection if pain is still there.   Take antibiotics for UTI, if develop any side effect, stop taking it and call our office and we will prescribe a different medication.   Trochanteric Bursitis You have hip pain due to trochanteric bursitis. Bursitis means that the sack near the outside of the hip is filled with fluid and inflamed. This sack is made up of protective soft tissue. The pain from trochanteric bursitis can be severe and keep you from sleep. It can radiate to the buttocks or down the outside of the thigh to the knee. The pain is almost always worse when rising from the seated or lying position and with walking. Pain can improve after you take a few steps. It happens more often in people with hip joint and lumbar spine problems, such as arthritis or previous surgery. Very rarely the trochanteric bursa can become infected, and antibiotics and/or surgery may be needed. Treatment often includes an injection of local anesthetic mixed with cortisone medicine. This medicine is injected into the area where it is most tender over the hip. Repeat injections may be necessary if the response to treatment is slow. You can apply ice packs over the tender area for 30 minutes every 2 hours for the next few days. Anti-inflammatory and/or narcotic pain medicine may also be helpful. Limit your activity for the next few days if the pain continues. See your caregiver in 5-10 days if you are not greatly improved.  SEEK IMMEDIATE MEDICAL CARE IF:  You develop severe pain, fever, or increased redness.  You have pain that radiates below the  knee. EXERCISES STRETCHING EXERCISES - Trochanteric Bursitis  These exercises may help you when beginning to rehabilitate your injury. Your symptoms may resolve with or without further involvement from your physician, physical therapist, or athletic trainer. While completing these exercises, remember:   Restoring tissue flexibility helps normal motion to return to the joints. This allows healthier, less painful movement and activity.  An effective stretch should be held for at least 30 seconds.  A stretch should never be painful. You should only feel a gentle lengthening or release in the stretched tissue. STRETCH - Iliotibial Band  On the floor or bed, lie on your side so your injured leg is on top. Bend your knee and grab your ankle.  Slowly bring your knee back so that your thigh is in line with your trunk. Keep your heel at your buttocks and gently arch your back so your head, shoulders and hips line up.  Slowly lower your leg so that your knee approaches the floor/bed until you feel a gentle stretch on the outside of your thigh. If you do not feel a stretch and your knee will not fall farther, place the heel of your opposite foot on top of your knee and pull your thigh down farther.  Hold this stretch for __________ seconds.  Repeat __________ times. Complete this exercise __________ times per day. STRETCH - Hamstrings, Supine   Lie on your back. Loop a belt or towel over the ball  of your foot as shown.  Straighten your knee and slowly pull on the belt to raise your injured leg. Do not allow the knee to bend. Keep your opposite leg flat on the floor.  Raise the leg until you feel a gentle stretch behind your knee or thigh. Hold this position for __________ seconds.  Repeat __________ times. Complete this stretch __________ times per day. STRETCH - Quadriceps, Prone   Lie on your stomach on a firm surface, such as a bed or padded floor.  Bend your knee and grasp your ankle. If  you are unable to reach your ankle or pant leg, use a belt around your foot to lengthen your reach.  Gently pull your heel toward your buttocks. Your knee should not slide out to the side. You should feel a stretch in the front of your thigh and/or knee.  Hold this position for __________ seconds.  Repeat __________ times. Complete this stretch __________ times per day. STRETCHING - Hip Flexors, Lunge Half kneel with your knee on the floor and your opposite knee bent and directly over your ankle.  Keep good posture with your head over your shoulders. Tighten your buttocks to point your tailbone downward; this will prevent your back from arching too much.  You should feel a gentle stretch in the front of your thigh and/or hip. If you do not feel any resistance, slightly slide your opposite foot forward and then slowly lunge forward so your knee once again lines up over your ankle. Be sure your tailbone remains pointed downward.  Hold this stretch for __________ seconds.  Repeat __________ times. Complete this stretch __________ times per day. STRETCH - Adductors, Lunge  While standing, spread your legs.  Lean away from your injured leg by bending your opposite knee. You may rest your hands on your thigh for balance.  You should feel a stretch in your inner thigh. Hold for __________ seconds.  Repeat __________ times. Complete this exercise __________ times per day.   This information is not intended to replace advice given to you by your health care provider. Make sure you discuss any questions you have with your health care provider.   Document Released: 04/08/2004 Document Revised: 07/16/2014 Document Reviewed: 06/13/2008 Elsevier Interactive Patient Education 2016 [employer].     IF you received an x-ray today, you will receive an invoice from [facility]. Please contact [facility] at [phone] with questions or concerns regarding your invoice.   IF you  received labwork today, you will receive an invoice from [employer]. Please contact [facility] at [phone] with questions or concerns regarding your invoice.   Our billing staff will not be able to assist you with questions regarding bills from these companies.  You will be contacted with the lab results as soon as they are available. The fastest way to get your results is to activate your My Chart account. Instructions are located on the last page of this paperwork. If you have not heard from [place] regarding the results in 2 weeks, please contact this office.

## 2015-12-08 NOTE — Telephone Encounter (Signed)
Called pharmacy spoke to [facility] the pharmacist to cancel Macrobid and fill Keflex.

## 2015-12-09 LAB — URINE CULTURE: ORGANISM ID, BACTERIA: NO GROWTH

## 2015-12-15 ENCOUNTER — Ambulatory Visit (INDEPENDENT_AMBULATORY_CARE_PROVIDER_SITE_OTHER): Payer: Medicare Other | Admitting: Physician Assistant

## 2015-12-15 VITALS — BP 126/84 | HR 70 | Temp 97.9°F | Resp 17 | Ht 68.0 in | Wt 216.0 lb

## 2015-12-15 DIAGNOSIS — M25552 Pain in left hip: Secondary | ICD-10-CM | POA: Diagnosis not present

## 2015-12-15 NOTE — Patient Instructions (Addendum)
  Follow up Friday for injection    IF you received an x-ray today, you will receive an invoice from [facility]. Please contact [facility] at [phone] with questions or concerns regarding your invoice.   IF you received labwork today, you will receive an invoice from [employer]. Please contact [facility] at [phone] with questions or concerns regarding your invoice.   Our billing staff will not be able to assist you with questions regarding bills from these companies.  You will be contacted with the lab results as soon as they are available. The fastest way to get your results is to activate your My Chart account. Instructions are located on the last page of this paperwork. If you have not heard from [place] regarding the results in 2 weeks, please contact this office.

## 2015-12-15 NOTE — Progress Notes (Signed)
Katherine Mcpherson  MRN: [id number] DOB: 1945-11-07  Subjective:  Katherine Mcpherson is a 70 y.o. female seen in office today for a chief complaint of intermittent left hip pain x 7 months. Pain is worsened when pressure is applied to left hip while lying down, walking around for long periods of time, and doing leg presses and biking at the gym. Has associated intermittent pain down lateral aspect of thigh. Denies numbness, tingling, loss of sensation, and decreased ROM.  Pt initially seen for this issue in our office on 12/08/15 in hopes to receive a steroid injection. However, she had just received a steroid injection in her back on 12/01/15.  Pt has had two steroid injections in her left hip in the past. First was in 05/2015 and it worked really well. Last steroid shot in left hip was in 08/2015 and she notes that it did not provide as much relief as the first.   Pt has tried heat, exercises, tylenol, soma TID for the past week with minimal relief.  Pt has appointment with Dr. Rolinda Roan on 01/13/16 for further treatment of back pain.    Review of Systems  Constitutional: Negative for chills and fever.    Patient Active Problem List   Diagnosis Date Noted  . Osteoarthritis of left knee 01/22/2013  . Osteoarthritis of right knee 07/31/2012  . HTN (hypertension) 12/30/2011  . Overweight(278.02) 12/30/2011  . Vaginal atrophy 12/30/2011  . Menopausal state 12/30/2011  . Low back pain 12/15/2011    Current Outpatient Prescriptions on File Prior to Visit  Medication Sig Dispense Refill  . acetaminophen (TYLENOL) 650 MG CR tablet Take 650 mg by mouth 2 (two) times daily as needed for pain.    . B Complex Vitamins (VITAMIN-B COMPLEX) TABS Take by mouth.    . carisoprodol (SOMA) 350 MG tablet Take 1 tablet (350 mg total) by mouth 3 (three) times daily as needed for muscle spasms. 60 tablet 0  . cephALEXin (KEFLEX) 500 MG capsule Take 1 capsule (500 mg total) by mouth 3 (three) times daily. 21  capsule 0  . Famotidine (PEPCID PO) Take by mouth.    Marland Kitchen GARLIC PO Take by mouth.    Marland Kitchen lisinopril (PRINIVIL,ZESTRIL) 10 MG tablet Take 1 tablet (10 mg total) by mouth daily. 90 tablet 3  . loratadine (CLARITIN) 10 MG tablet Take 10 mg by mouth daily as needed for allergies.    Marland Kitchen OVER THE COUNTER MEDICATION Take 2 capsules by mouth daily. CholestOff    . oxycodone (OXY-IR) 5 MG capsule Take 5 mg by mouth every 4 (four) hours as needed for pain.    Marland Kitchen triamcinolone (NASACORT AQ) 55 MCG/ACT AERO nasal inhaler Place 2 sprays into the nose daily. 1 Inhaler 12  . TURMERIC PO Take by mouth.    . diclofenac (VOLTAREN) 75 MG EC tablet Take 75 mg by mouth daily. Reported on 08/25/2015     No current facility-administered medications on file prior to visit.     Allergies  Allergen Reactions  . Sulfa Antibiotics Other (See Comments)    Makes tongue feel funny and makes her feel like she will pass  Out.  Last for days.  . Penicillins Itching    Childhood allergy.   . Latex Rash    Had rash on lft side only after back surgery    Objective:  BP 126/84 (BP Location: Right Arm, Patient Position: Sitting, Cuff Size: Large)   Pulse 70   Temp 97.9 F (  36.6 C) (Oral)   Resp 17   Ht 5\' 8"  (1.727 m)   Wt 216 lb (98 kg)   SpO2 100%   BMI 32.84 kg/m   Physical Exam  Constitutional: She is oriented to person, place, and time and well-developed, well-nourished, and in no distress.  HENT:  Head: Normocephalic and atraumatic.  Eyes: Conjunctivae are normal.  Neck: Normal range of motion.  Pulmonary/Chest: Effort normal.  Musculoskeletal:       Right hip: Normal.       Left hip: She exhibits tenderness (directly over palpation greater trochanter ).       Right upper leg: Normal.       Left upper leg: Normal.  Neurological: She is alert and oriented to person, place, and time. Gait normal.  Skin: Skin is warm and dry.  Psychiatric: Affect normal.  Vitals reviewed.   Assessment and Plan :   1.  Hip pain, acute, left -Unfortunately, we did not have kenalog 40 in stock today in our office.  -Pt will follow up Friday for injection.   Tenna Delaine PA-C  [facility] 12/15/2015 8:16 AM

## 2015-12-19 ENCOUNTER — Ambulatory Visit (INDEPENDENT_AMBULATORY_CARE_PROVIDER_SITE_OTHER): Payer: Medicare Other | Admitting: Physician Assistant

## 2015-12-19 VITALS — BP 130/80 | HR 68 | Temp 98.0°F | Resp 17 | Ht 68.5 in | Wt 217.0 lb

## 2015-12-19 DIAGNOSIS — M25552 Pain in left hip: Secondary | ICD-10-CM | POA: Diagnosis not present

## 2015-12-19 NOTE — Progress Notes (Signed)
patient seen and examined with Ms. Timmothy Euler. Pain and location consistent with trochanteric bursitis. Discussed options for treatment, minimal improvement since last visit, still desires injection.  Risks (including but not limited to bleeding and infection, damage to underlying structures), benefits, and alternatives discussed for L trochanteric bursa injection .  Verbal consent obtained after any questions were answered. Landmarks noted, and marked as needed. Area cleansed with Betadine x2, ethyl chloride spray for topical anesthesia.  Injected with 1cc kenalog 40mg /ml with 1cc lidocaine 1% plain.  No complications. Bandage applied.  RTC precautions discussed in regards to injection. Aftercare per AVS.

## 2015-12-19 NOTE — Patient Instructions (Addendum)
Aftercare: Avoid excessive hip movement over the next two weeks. Use nsaids, ice , heat as needed Follow up as needed Trochanteric Bursitis You have hip pain due to trochanteric bursitis. Bursitis means that the sack near the outside of the hip is filled with fluid and inflamed. This sack is made up of protective soft tissue. The pain from trochanteric bursitis can be severe and keep you from sleep. It can radiate to the buttocks or down the outside of the thigh to the knee. The pain is almost always worse when rising from the seated or lying position and with walking. Pain can improve after you take a few steps. It happens more often in people with hip joint and lumbar spine problems, such as arthritis or previous surgery. Very rarely the trochanteric bursa can become infected, and antibiotics and/or surgery may be needed. Treatment often includes an injection of local anesthetic mixed with cortisone medicine. This medicine is injected into the area where it is most tender over the hip. Repeat injections may be necessary if the response to treatment is slow. You can apply ice packs over the tender area for 30 minutes every 2 hours for the next few days. Anti-inflammatory and/or narcotic pain medicine may also be helpful. Limit your activity for the next few days if the pain continues. See your caregiver in 5-10 days if you are not greatly improved.  SEEK IMMEDIATE MEDICAL CARE IF:  You develop severe pain, fever, or increased redness.  You have pain that radiates below the knee. EXERCISES STRETCHING EXERCISES - Trochanteric Bursitis  These exercises may help you when beginning to rehabilitate your injury. Your symptoms may resolve with or without further involvement from your physician, physical therapist, or athletic trainer. While completing these exercises, remember:   Restoring tissue flexibility helps normal motion to return to the joints. This allows healthier, less painful movement and  activity.  An effective stretch should be held for at least 30 seconds.  A stretch should never be painful. You should only feel a gentle lengthening or release in the stretched tissue. STRETCH - Iliotibial Band  On the floor or bed, lie on your side so your injured leg is on top. Bend your knee and grab your ankle.  Slowly bring your knee back so that your thigh is in line with your trunk. Keep your heel at your buttocks and gently arch your back so your head, shoulders and hips line up.  Slowly lower your leg so that your knee approaches the floor/bed until you feel a gentle stretch on the outside of your thigh. If you do not feel a stretch and your knee will not fall farther, place the heel of your opposite foot on top of your knee and pull your thigh down farther.  Hold this stretch for __________ seconds.  Repeat __________ times. Complete this exercise __________ times per day. STRETCH - Hamstrings, Supine   Lie on your back. Loop a belt or towel over the ball of your foot as shown.  Straighten your knee and slowly pull on the belt to raise your injured leg. Do not allow the knee to bend. Keep your opposite leg flat on the floor.  Raise the leg until you feel a gentle stretch behind your knee or thigh. Hold this position for __________ seconds.  Repeat __________ times. Complete this stretch __________ times per day. STRETCH - Quadriceps, Prone   Lie on your stomach on a firm surface, such as a bed or padded floor.  Bend your knee and grasp your ankle. If you are unable to reach your ankle or pant leg, use a belt around your foot to lengthen your reach.  Gently pull your heel toward your buttocks. Your knee should not slide out to the side. You should feel a stretch in the front of your thigh and/or knee.  Hold this position for __________ seconds.  Repeat __________ times. Complete this stretch __________ times per day. STRETCHING - Hip Flexors, Lunge Half kneel with your  knee on the floor and your opposite knee bent and directly over your ankle.  Keep good posture with your head over your shoulders. Tighten your buttocks to point your tailbone downward; this will prevent your back from arching too much.  You should feel a gentle stretch in the front of your thigh and/or hip. If you do not feel any resistance, slightly slide your opposite foot forward and then slowly lunge forward so your knee once again lines up over your ankle. Be sure your tailbone remains pointed downward.  Hold this stretch for __________ seconds.  Repeat __________ times. Complete this stretch __________ times per day. STRETCH - Adductors, Lunge  While standing, spread your legs.  Lean away from your injured leg by bending your opposite knee. You may rest your hands on your thigh for balance.  You should feel a stretch in your inner thigh. Hold for __________ seconds.  Repeat __________ times. Complete this exercise __________ times per day.   This information is not intended to replace advice given to you by your health care provider. Make sure you discuss any questions you have with your health care provider.   Document Released: 04/08/2004 Document Revised: 07/16/2014 Document Reviewed: 06/13/2008 Elsevier Interactive Patient Education 2016 [employer].    IF you received an x-ray today, you will receive an invoice from [facility]. Please contact [facility] at [phone] with questions or concerns regarding your invoice.   IF you received labwork today, you will receive an invoice from [employer]. Please contact [facility] at [phone] with questions or concerns regarding your invoice.   Our billing staff will not be able to assist you with questions regarding bills from these companies.  You will be contacted with the lab results as soon as they are available. The fastest way to get your results is to activate your My Chart  account. Instructions are located on the last page of this paperwork. If you have not heard from [place] regarding the results in 2 weeks, please contact this office.

## 2015-12-19 NOTE — Progress Notes (Signed)
ALLAYNA ADEL  MRN: [id number] DOB: 22-May-1945  Subjective:  Katherine Mcpherson is a 70 y.o. female seen in office today for follow up on left hip injection. Was seen earlier this week (12/15/2015) for hip pain evaluation. After evaluation of pt, the plan was to give her a steroid injection. However, we did not have kenalog in office on 12/15/15 so pt was informed to come back later this week when we were restocked with kenalog. She states her hip pain is a 6/10 today. She starts physical therapy for the hip next week.   Review of Systems  Musculoskeletal: Positive for back pain.   Patient Active Problem List   Diagnosis Date Noted  . Osteoarthritis of left knee 01/22/2013  . Osteoarthritis of right knee 07/31/2012  . HTN (hypertension) 12/30/2011  . Overweight(278.02) 12/30/2011  . Vaginal atrophy 12/30/2011  . Menopausal state 12/30/2011  . Low back pain 12/15/2011    Current Outpatient Prescriptions on File Prior to Visit  Medication Sig Dispense Refill  . acetaminophen (TYLENOL) 650 MG CR tablet Take 650 mg by mouth 2 (two) times daily as needed for pain.    . B Complex Vitamins (VITAMIN-B COMPLEX) TABS Take by mouth.    . carisoprodol (SOMA) 350 MG tablet Take 1 tablet (350 mg total) by mouth 3 (three) times daily as needed for muscle spasms. 60 tablet 0  . Famotidine (PEPCID PO) Take by mouth.    Marland Kitchen GARLIC PO Take by mouth.    Marland Kitchen lisinopril (PRINIVIL,ZESTRIL) 10 MG tablet Take 1 tablet (10 mg total) by mouth daily. 90 tablet 3  . loratadine (CLARITIN) 10 MG tablet Take 10 mg by mouth daily as needed for allergies.    Marland Kitchen OVER THE COUNTER MEDICATION Take 2 capsules by mouth daily. CholestOff    . oxycodone (OXY-IR) 5 MG capsule Take 5 mg by mouth every 4 (four) hours as needed for pain.    Marland Kitchen triamcinolone (NASACORT AQ) 55 MCG/ACT AERO nasal inhaler Place 2 sprays into the nose daily. 1 Inhaler 12  . TURMERIC PO Take by mouth.    . diclofenac (VOLTAREN) 75 MG EC tablet Take 75  mg by mouth daily. Reported on 08/25/2015     No current facility-administered medications on file prior to visit.     Allergies  Allergen Reactions  . Sulfa Antibiotics Other (See Comments)    Makes tongue feel funny and makes her feel like she will pass  Out.  Last for days.  . Penicillins Itching    Childhood allergy.   . Latex Rash    Had rash on lft side only after back surgery    Objective:  BP 130/80 (BP Location: Right Arm, Patient Position: Sitting, Cuff Size: Normal)   Pulse 68   Temp 98 F (36.7 C) (Oral)   Resp 17   Ht 5' 8.5" (1.74 m)   Wt 217 lb (98.4 kg)   SpO2 100%   BMI 32.51 kg/m   Physical Exam  Constitutional: She is oriented to person, place, and time.  Well developed, well nourished, appears in moderate discomfort.   HENT:  Head: Normocephalic and atraumatic.  Eyes: Conjunctivae are normal.  Neck: Normal range of motion.  Pulmonary/Chest: Effort normal.  Musculoskeletal:       Left hip: She exhibits tenderness (directly over palpation of greater trochanter).  Neurological: She is alert and oriented to person, place, and time.  Skin: Skin is warm and dry.  Psychiatric: Affect normal.  Vitals  reviewed.   Assessment and Plan :   1. Left hip pain -Injection given. -Aftercare instructions given.   Tenna Delaine PA-C  [facility] 12/19/2015 8:54 AM

## 2015-12-21 ENCOUNTER — Encounter: Payer: Self-pay | Admitting: Physician Assistant

## 2015-12-22 DIAGNOSIS — M6281 Muscle weakness (generalized): Secondary | ICD-10-CM | POA: Diagnosis not present

## 2015-12-22 DIAGNOSIS — M545 Low back pain: Secondary | ICD-10-CM | POA: Diagnosis not present

## 2015-12-22 DIAGNOSIS — M25552 Pain in left hip: Secondary | ICD-10-CM | POA: Diagnosis not present

## 2015-12-26 DIAGNOSIS — M25552 Pain in left hip: Secondary | ICD-10-CM | POA: Diagnosis not present

## 2015-12-26 DIAGNOSIS — M6281 Muscle weakness (generalized): Secondary | ICD-10-CM | POA: Diagnosis not present

## 2015-12-26 DIAGNOSIS — M545 Low back pain: Secondary | ICD-10-CM | POA: Diagnosis not present

## 2015-12-29 DIAGNOSIS — M545 Low back pain: Secondary | ICD-10-CM | POA: Diagnosis not present

## 2015-12-29 DIAGNOSIS — M6281 Muscle weakness (generalized): Secondary | ICD-10-CM | POA: Diagnosis not present

## 2015-12-29 DIAGNOSIS — M25552 Pain in left hip: Secondary | ICD-10-CM | POA: Diagnosis not present

## 2016-01-01 DIAGNOSIS — M6281 Muscle weakness (generalized): Secondary | ICD-10-CM | POA: Diagnosis not present

## 2016-01-01 DIAGNOSIS — M25552 Pain in left hip: Secondary | ICD-10-CM | POA: Diagnosis not present

## 2016-01-01 DIAGNOSIS — M545 Low back pain: Secondary | ICD-10-CM | POA: Diagnosis not present

## 2016-01-02 DIAGNOSIS — M545 Low back pain: Secondary | ICD-10-CM | POA: Diagnosis not present

## 2016-01-02 DIAGNOSIS — M4696 Unspecified inflammatory spondylopathy, lumbar region: Secondary | ICD-10-CM | POA: Diagnosis not present

## 2016-01-06 DIAGNOSIS — M6281 Muscle weakness (generalized): Secondary | ICD-10-CM | POA: Diagnosis not present

## 2016-01-06 DIAGNOSIS — M545 Low back pain: Secondary | ICD-10-CM | POA: Diagnosis not present

## 2016-01-06 DIAGNOSIS — M25552 Pain in left hip: Secondary | ICD-10-CM | POA: Diagnosis not present

## 2016-01-08 DIAGNOSIS — M6281 Muscle weakness (generalized): Secondary | ICD-10-CM | POA: Diagnosis not present

## 2016-01-08 DIAGNOSIS — M545 Low back pain: Secondary | ICD-10-CM | POA: Diagnosis not present

## 2016-01-08 DIAGNOSIS — M25552 Pain in left hip: Secondary | ICD-10-CM | POA: Diagnosis not present

## 2016-01-12 DIAGNOSIS — M25552 Pain in left hip: Secondary | ICD-10-CM | POA: Diagnosis not present

## 2016-01-12 DIAGNOSIS — M545 Low back pain: Secondary | ICD-10-CM | POA: Diagnosis not present

## 2016-01-12 DIAGNOSIS — M6281 Muscle weakness (generalized): Secondary | ICD-10-CM | POA: Diagnosis not present

## 2016-01-13 DIAGNOSIS — M4126 Other idiopathic scoliosis, lumbar region: Secondary | ICD-10-CM | POA: Diagnosis not present

## 2016-01-13 DIAGNOSIS — M791 Myalgia: Secondary | ICD-10-CM | POA: Diagnosis not present

## 2016-01-13 DIAGNOSIS — M47816 Spondylosis without myelopathy or radiculopathy, lumbar region: Secondary | ICD-10-CM | POA: Diagnosis not present

## 2016-01-13 DIAGNOSIS — M5136 Other intervertebral disc degeneration, lumbar region: Secondary | ICD-10-CM | POA: Diagnosis not present

## 2016-01-16 DIAGNOSIS — M6281 Muscle weakness (generalized): Secondary | ICD-10-CM | POA: Diagnosis not present

## 2016-01-16 DIAGNOSIS — M25552 Pain in left hip: Secondary | ICD-10-CM | POA: Diagnosis not present

## 2016-01-16 DIAGNOSIS — M545 Low back pain: Secondary | ICD-10-CM | POA: Diagnosis not present

## 2016-01-21 DIAGNOSIS — M25552 Pain in left hip: Secondary | ICD-10-CM | POA: Diagnosis not present

## 2016-01-21 DIAGNOSIS — M6281 Muscle weakness (generalized): Secondary | ICD-10-CM | POA: Diagnosis not present

## 2016-01-21 DIAGNOSIS — M545 Low back pain: Secondary | ICD-10-CM | POA: Diagnosis not present

## 2016-01-26 DIAGNOSIS — M545 Low back pain: Secondary | ICD-10-CM | POA: Diagnosis not present

## 2016-01-26 DIAGNOSIS — M6281 Muscle weakness (generalized): Secondary | ICD-10-CM | POA: Diagnosis not present

## 2016-01-26 DIAGNOSIS — M25552 Pain in left hip: Secondary | ICD-10-CM | POA: Diagnosis not present

## 2016-02-04 DIAGNOSIS — M25552 Pain in left hip: Secondary | ICD-10-CM | POA: Diagnosis not present

## 2016-02-04 DIAGNOSIS — M545 Low back pain: Secondary | ICD-10-CM | POA: Diagnosis not present

## 2016-02-04 DIAGNOSIS — M6281 Muscle weakness (generalized): Secondary | ICD-10-CM | POA: Diagnosis not present

## 2016-02-13 DIAGNOSIS — M545 Low back pain: Secondary | ICD-10-CM | POA: Diagnosis not present

## 2016-02-13 DIAGNOSIS — M25552 Pain in left hip: Secondary | ICD-10-CM | POA: Diagnosis not present

## 2016-02-13 DIAGNOSIS — M6281 Muscle weakness (generalized): Secondary | ICD-10-CM | POA: Diagnosis not present

## 2016-02-17 DIAGNOSIS — M6281 Muscle weakness (generalized): Secondary | ICD-10-CM | POA: Diagnosis not present

## 2016-02-17 DIAGNOSIS — M545 Low back pain: Secondary | ICD-10-CM | POA: Diagnosis not present

## 2016-02-17 DIAGNOSIS — M25552 Pain in left hip: Secondary | ICD-10-CM | POA: Diagnosis not present

## 2016-02-19 DIAGNOSIS — M545 Low back pain: Secondary | ICD-10-CM | POA: Diagnosis not present

## 2016-02-19 DIAGNOSIS — M5136 Other intervertebral disc degeneration, lumbar region: Secondary | ICD-10-CM | POA: Diagnosis not present

## 2016-02-19 DIAGNOSIS — M791 Myalgia: Secondary | ICD-10-CM | POA: Diagnosis not present

## 2016-02-19 DIAGNOSIS — M47816 Spondylosis without myelopathy or radiculopathy, lumbar region: Secondary | ICD-10-CM | POA: Diagnosis not present

## 2016-02-19 DIAGNOSIS — M47817 Spondylosis without myelopathy or radiculopathy, lumbosacral region: Secondary | ICD-10-CM | POA: Diagnosis not present

## 2016-02-23 ENCOUNTER — Other Ambulatory Visit: Payer: Self-pay | Admitting: Family Medicine

## 2016-02-23 DIAGNOSIS — Z1231 Encounter for screening mammogram for malignant neoplasm of breast: Secondary | ICD-10-CM

## 2016-03-03 DIAGNOSIS — M6281 Muscle weakness (generalized): Secondary | ICD-10-CM | POA: Diagnosis not present

## 2016-03-03 DIAGNOSIS — M25552 Pain in left hip: Secondary | ICD-10-CM | POA: Diagnosis not present

## 2016-03-03 DIAGNOSIS — M545 Low back pain: Secondary | ICD-10-CM | POA: Diagnosis not present

## 2016-03-15 ENCOUNTER — Encounter: Payer: Self-pay | Admitting: Physician Assistant

## 2016-03-23 ENCOUNTER — Ambulatory Visit
Admission: RE | Admit: 2016-03-23 | Discharge: 2016-03-23 | Disposition: A | Discharge status: Home / Self Care | Payer: Medicare Other | Source: Ambulatory Visit | Attending: Family Medicine | Admitting: Family Medicine

## 2016-03-23 DIAGNOSIS — Z1231 Encounter for screening mammogram for malignant neoplasm of breast: Secondary | ICD-10-CM | POA: Diagnosis not present

## 2016-04-06 ENCOUNTER — Encounter: Payer: Self-pay | Admitting: Family Medicine

## 2016-04-06 ENCOUNTER — Ambulatory Visit (INDEPENDENT_AMBULATORY_CARE_PROVIDER_SITE_OTHER): Payer: Medicare Other | Admitting: Family Medicine

## 2016-04-06 VITALS — BP 118/76 | HR 120 | Temp 97.9°F | Wt 217.6 lb

## 2016-04-06 DIAGNOSIS — R002 Palpitations: Secondary | ICD-10-CM

## 2016-04-06 DIAGNOSIS — E668 Other obesity: Secondary | ICD-10-CM | POA: Diagnosis not present

## 2016-04-06 DIAGNOSIS — E785 Hyperlipidemia, unspecified: Secondary | ICD-10-CM | POA: Diagnosis not present

## 2016-04-06 DIAGNOSIS — I10 Essential (primary) hypertension: Secondary | ICD-10-CM | POA: Diagnosis not present

## 2016-04-06 DIAGNOSIS — I4891 Unspecified atrial fibrillation: Secondary | ICD-10-CM

## 2016-04-06 DIAGNOSIS — R000 Tachycardia, unspecified: Secondary | ICD-10-CM

## 2016-04-06 DIAGNOSIS — I480 Paroxysmal atrial fibrillation: Secondary | ICD-10-CM | POA: Diagnosis not present

## 2016-04-06 LAB — POCT CBC
GRANULOCYTE PERCENT: 79.3 % (ref 37–80)
HCT, POC: 43 % (ref 37.7–47.9)
HEMOGLOBIN: 14.9 g/dL (ref 12.2–16.2)
Lymph, poc: 1.6 (ref 0.6–3.4)
MCH: 30.2 pg (ref 27–31.2)
MCHC: 34.6 g/dL (ref 31.8–35.4)
MCV: 87.3 fL (ref 80–97)
MID (cbc): 0.5 (ref 0–0.9)
MPV: 8.1 fL (ref 0–99.8)
POC GRANULOCYTE: 8.1 — AB (ref 2–6.9)
POC LYMPH PERCENT: 15.4 %L (ref 10–50)
POC MID %: 5.3 % (ref 0–12)
Platelet Count, POC: 332 10*3/uL (ref 142–424)
RBC: 4.93 M/uL (ref 4.04–5.48)
RDW, POC: 12.7 %
WBC: 10.2 10*3/uL (ref 4.6–10.2)

## 2016-04-06 LAB — TROPONIN I: Troponin I: <0.01 ng/mL (ref 0.00–0.04)

## 2016-04-06 MED ORDER — METOPROLOL TARTRATE 25 MG PO TABS: 25.0000 mg | ORAL_TABLET | Freq: Two times a day (BID) | ORAL | 0 refills | Status: DC

## 2016-04-06 MED ORDER — METOPROLOL TARTRATE 12.5 MG HALF TABLET
50.0000 mg | ORAL_TABLET | Freq: Once | ORAL | Status: AC
  Administered 2016-04-06: 50 mg via ORAL

## 2016-04-06 NOTE — Progress Notes (Signed)
Ov, snapshot ,  Referral and ekg faxed to [facility] cardiology for urgent appt. (computer down so couldn't schedule over phone, they will call pt.)

## 2016-04-06 NOTE — Progress Notes (Addendum)
Subjective:  This chart was scribed for Delman Cheadle MD,  by Tamsen Roers, at [facility].  This patient was seen in room 6 and the patient's care was started at 8:09 AM.     Patient ID: Katherine Mcpherson, female    DOB: Apr 03, 1945, 71 y.o.   MRN: [id number]  HPI HPI Comments: Katherine Mcpherson is a 71 y.o. female who presents to the [facility] complaining of "unusual" palpitations onset this morning around 6 am.  Patient was already awake when this occurred. She is still currently occasionally feeling the palpitations. She denies any recent colds. For the past three weeks thoough, she has some slight congestion as well as a cough.  She has taken over the counter medication (Mucin ex and HPP) about two days ago for relief.  She denies any loss of appetite, dizziness, nausea/vomitting,SOB,  chest pressure/tightness or swelling in her extremities She went to the gym for 40 minutes yesterday.  Patient usually goes to the gym about three times per week and states that she has no problems with physical activity.   Patient brought in her blood pressure monitor which showed her home cuff readings mostly ranging around 110/60. Largely though can vary from 100 to 150 occasionally. Pulse normally runs 50 to 80. Usually around 60's. However this morning bp was ranging around 140-150, 100-131. Pulse in the 60's to 80's. She has no history of heart problems.   Patient takes diclofenac occasionally - states that it is not often.   9:10 AM re-check:  Heart still irregular with rate and rhythm.  Rate varying from 90-120.  Metoprolol was given at 8:45 AM.  BP: 118/76.     Past Medical History:  Diagnosis Date  . Allergy   . Anemia    "comes and goes" (01/22/2013)  . Arthritis    "qwhere" (01/22/2013)  . Chronic lower back pain    "ongoing since back OR 2013" (01/22/2013)  . Fall from slip, trip, or stumble 01/11/2013   "stubbed toe at gas pump at [place]"  (01/22/2013)  . GERD (gastroesophageal reflux disease)   . H/O hiatal hernia   . High cholesterol   . Hypertension   . Polyp of colon 10/2009  . PONV (postoperative nausea and vomiting)     Current Outpatient Prescriptions on File Prior to Visit  Medication Sig Dispense Refill  . acetaminophen (TYLENOL) 650 MG CR tablet Take 650 mg by mouth 2 (two) times daily as needed for pain.    . B Complex Vitamins (VITAMIN-B COMPLEX) TABS Take by mouth.    . carisoprodol (SOMA) 350 MG tablet Take 1 tablet (350 mg total) by mouth 3 (three) times daily as needed for muscle spasms. 60 tablet 0  . diclofenac (VOLTAREN) 75 MG EC tablet Take 75 mg by mouth daily. Reported on 08/25/2015    . Famotidine (PEPCID PO) Take by mouth.    Marland Kitchen GARLIC PO Take by mouth.    Marland Kitchen lisinopril (PRINIVIL,ZESTRIL) 10 MG tablet Take 1 tablet (10 mg total) by mouth daily. 90 tablet 3  . loratadine (CLARITIN) 10 MG tablet Take 10 mg by mouth daily as needed for allergies.    Marland Kitchen OVER THE COUNTER MEDICATION Take 2 capsules by mouth daily. CholestOff    . oxycodone (OXY-IR) 5 MG capsule Take 5 mg by mouth every 4 (four) hours as needed for pain.    Marland Kitchen triamcinolone (NASACORT AQ) 55 MCG/ACT AERO nasal inhaler Place 2  sprays into the nose daily. 1 Inhaler 12  . TURMERIC PO Take by mouth.     No current facility-administered medications on file prior to visit.     Allergies  Allergen Reactions  . Sulfa Antibiotics Other (See Comments)    Makes tongue feel funny and makes her feel like she will pass  Out.  Last for days.  . Penicillins Itching    Childhood allergy.   . Latex Rash    Had rash on lft side only after back surgery    Review of Systems  Constitutional: Negative for chills and fever.  HENT: Positive for congestion.   Eyes: Negative for pain, redness and itching.  Respiratory: Positive for cough. Negative for choking, chest tightness and shortness of breath.   Cardiovascular: Positive for palpitations. Negative for  leg swelling.  Gastrointestinal: Negative for nausea and vomiting.  Musculoskeletal: Negative for neck pain and neck stiffness.  Neurological: Negative for dizziness and speech difficulty.       Objective:   Physical Exam  Constitutional: She appears well-developed. No distress.  HENT:  Head: Normocephalic and atraumatic.  Eyes: Pupils are equal, round, and reactive to light.  Cardiovascular:  Tachycardic with irregular rate and rhythm.  No lower extremity edema, 2 + pedal pulses.   Pulmonary/Chest: Effort normal and breath sounds normal. No respiratory distress.  Neurological: She is alert.  neurological exam was normal, no gross cranial nerve abnormality.   Skin: Skin is warm and dry.  Psychiatric: She has a normal mood and affect. Her behavior is normal.   CHA 2 DS 2- VASc score of 3    Vitals:   04/06/16 0813  BP: 120/82  Pulse: (!) 130  Temp: 97.9 F (36.6 C)  TempSrc: Oral  Weight: 217 lb 9.6 oz (98.7 kg)    EKG: atrial fibrillation with RVR 142.   Results for orders placed or performed in visit on 04/06/16  POCT CBC  Result Value Ref Range   WBC 10.2 4.6 - 10.2 K/uL   Lymph, poc 1.6 0.6 - 3.4   POC LYMPH PERCENT 15.4 10 - 50 %L   MID (cbc) 0.5 0 - 0.9   POC MID % 5.3 0 - 12 %M   POC Granulocyte 8.1 (A) 2 - 6.9   Granulocyte percent 79.3 37 - 80 %G   RBC 4.93 4.04 - 5.48 M/uL   Hemoglobin 14.9 12.2 - 16.2 g/dL   HCT, POC 43.0 37.7 - 47.9 %   MCV 87.3 80 - 97 fL   MCH, POC 30.2 27 - 31.2 pg   MCHC 34.6 31.8 - 35.4 g/dL   RDW, POC 12.7 %   Platelet Count, POC 332 142 - 424 K/uL   MPV 8.1 0 - 99.8 fL        Assessment & Plan:   1. Palpitations   2. Tachycardia   3. New onset atrial fibrillation (HCC)   No indication of acute ischemia. No evidence of end organ hypoperfusion. No sig PMHx, no cardiac hx, only current sxs is some mild palpitations so I think it is safe to work pt up as an outpt.  Will try to have her emergently worked into cardiology  today.  Metoprolol 50 given in office. Will cont to monitor.  CHA2DS2-VASC score = 1 so unsure about anti-coagulation.   Orders Placed This Encounter  Procedures  . Comprehensive metabolic panel  . TSH  . Troponin I  . Ambulatory referral to Cardiology    Referral  Priority:   Urgent    Referral Type:   Consultation    Referral Reason:   Specialty Services Required    Requested Specialty:   Cardiology    Number of Visits Requested:   1  . POCT CBC  . EKG 12-Lead    Meds ordered this encounter  Medications  . metoprolol tartrate (LOPRESSOR) tablet 50 mg    I personally performed the services described in this documentation, which was scribed in my presence. The recorded information has been reviewed and considered, and addended by me as needed.   Delman Cheadle, M.D.  [facility] [address] [place], [place] [address] [phone] phone [phone] fax  04/06/16 8:36 AM

## 2016-04-06 NOTE — Patient Instructions (Signed)
     IF you received an x-ray today, you will receive an invoice from [facility]. Please contact [facility] at [phone] with questions or concerns regarding your invoice.   IF you received labwork today, you will receive an invoice from [facility]. Please contact [facility] at [phone] with questions or concerns regarding your invoice.   Our billing staff will not be able to assist you with questions regarding bills from these companies.  You will be contacted with the lab results as soon as they are available. The fastest way to get your results is to activate your My Chart account. Instructions are located on the last page of this paperwork. If you have not heard from us regarding the results in 2 weeks, please contact this office.     

## 2016-04-07 ENCOUNTER — Encounter: Payer: Self-pay | Admitting: Family Medicine

## 2016-04-07 LAB — COMPREHENSIVE METABOLIC PANEL
A/G RATIO: 1.7 (ref 1.2–2.2)
ALT: 9 IU/L (ref 0–32)
AST: 13 IU/L (ref 0–40)
Albumin: 4.3 g/dL (ref 3.5–4.8)
Alkaline Phosphatase: 74 IU/L (ref 39–117)
BUN/Creatinine Ratio: 22 (ref 12–28)
BUN: 20 mg/dL (ref 8–27)
Bilirubin Total: 0.3 mg/dL (ref 0.0–1.2)
CALCIUM: 9.4 mg/dL (ref 8.7–10.3)
CO2: 24 mmol/L (ref 18–29)
CREATININE: 0.9 mg/dL (ref 0.57–1.00)
Chloride: 101 mmol/L (ref 96–106)
GFR, EST AFRICAN AMERICAN: 74 mL/min/{1.73_m2} (ref >59)
GFR, EST NON AFRICAN AMERICAN: 64 mL/min/{1.73_m2} (ref >59)
GLUCOSE: 103 mg/dL — AB (ref 65–99)
Globulin, Total: 2.6 g/dL (ref 1.5–4.5)
Potassium: 5.1 mmol/L (ref 3.5–5.2)
Sodium: 140 mmol/L (ref 134–144)
TOTAL PROTEIN: 6.9 g/dL (ref 6.0–8.5)

## 2016-04-07 LAB — SPECIMEN STATUS

## 2016-04-07 LAB — TSH: TSH: 1.97 u[IU]/mL (ref 0.450–4.500)

## 2016-04-08 ENCOUNTER — Encounter: Payer: Self-pay | Admitting: Family Medicine

## 2016-04-11 ENCOUNTER — Encounter: Payer: Self-pay | Admitting: Family Medicine

## 2016-04-12 ENCOUNTER — Encounter: Payer: Self-pay | Admitting: Family Medicine

## 2016-04-12 DIAGNOSIS — I10 Essential (primary) hypertension: Secondary | ICD-10-CM | POA: Diagnosis not present

## 2016-04-12 DIAGNOSIS — E785 Hyperlipidemia, unspecified: Secondary | ICD-10-CM | POA: Diagnosis not present

## 2016-04-12 DIAGNOSIS — I480 Paroxysmal atrial fibrillation: Secondary | ICD-10-CM | POA: Diagnosis not present

## 2016-04-12 DIAGNOSIS — E668 Other obesity: Secondary | ICD-10-CM | POA: Diagnosis not present

## 2016-04-12 DIAGNOSIS — R002 Palpitations: Secondary | ICD-10-CM | POA: Diagnosis not present

## 2016-04-15 DIAGNOSIS — M7062 Trochanteric bursitis, left hip: Secondary | ICD-10-CM | POA: Diagnosis not present

## 2016-04-15 DIAGNOSIS — M7541 Impingement syndrome of right shoulder: Secondary | ICD-10-CM | POA: Diagnosis not present

## 2016-04-21 DIAGNOSIS — M4126 Other idiopathic scoliosis, lumbar region: Secondary | ICD-10-CM | POA: Diagnosis not present

## 2016-04-21 DIAGNOSIS — Z79891 Long term (current) use of opiate analgesic: Secondary | ICD-10-CM | POA: Diagnosis not present

## 2016-04-21 DIAGNOSIS — M791 Myalgia: Secondary | ICD-10-CM | POA: Diagnosis not present

## 2016-04-21 DIAGNOSIS — M5136 Other intervertebral disc degeneration, lumbar region: Secondary | ICD-10-CM | POA: Diagnosis not present

## 2016-04-21 DIAGNOSIS — G894 Chronic pain syndrome: Secondary | ICD-10-CM | POA: Diagnosis not present

## 2016-04-21 DIAGNOSIS — Z79899 Other long term (current) drug therapy: Secondary | ICD-10-CM | POA: Diagnosis not present

## 2016-05-12 DIAGNOSIS — M7541 Impingement syndrome of right shoulder: Secondary | ICD-10-CM | POA: Diagnosis not present

## 2016-07-19 DIAGNOSIS — I10 Essential (primary) hypertension: Secondary | ICD-10-CM | POA: Diagnosis not present

## 2016-07-19 DIAGNOSIS — Z7901 Long term (current) use of anticoagulants: Secondary | ICD-10-CM | POA: Diagnosis not present

## 2016-07-19 DIAGNOSIS — I119 Hypertensive heart disease without heart failure: Secondary | ICD-10-CM | POA: Diagnosis not present

## 2016-07-19 DIAGNOSIS — I480 Paroxysmal atrial fibrillation: Secondary | ICD-10-CM | POA: Diagnosis not present

## 2016-07-19 DIAGNOSIS — E668 Other obesity: Secondary | ICD-10-CM | POA: Diagnosis not present

## 2016-07-19 DIAGNOSIS — E785 Hyperlipidemia, unspecified: Secondary | ICD-10-CM | POA: Diagnosis not present

## 2016-07-28 ENCOUNTER — Encounter: Payer: Self-pay | Admitting: Gynecology

## 2016-08-17 DIAGNOSIS — K219 Gastro-esophageal reflux disease without esophagitis: Secondary | ICD-10-CM | POA: Diagnosis not present

## 2016-08-17 DIAGNOSIS — Z1211 Encounter for screening for malignant neoplasm of colon: Secondary | ICD-10-CM | POA: Diagnosis not present

## 2016-08-17 DIAGNOSIS — E669 Obesity, unspecified: Secondary | ICD-10-CM | POA: Diagnosis not present

## 2016-08-17 DIAGNOSIS — K5903 Drug induced constipation: Secondary | ICD-10-CM | POA: Diagnosis not present

## 2016-08-24 DIAGNOSIS — M19071 Primary osteoarthritis, right ankle and foot: Secondary | ICD-10-CM | POA: Diagnosis not present

## 2016-08-24 DIAGNOSIS — M7062 Trochanteric bursitis, left hip: Secondary | ICD-10-CM | POA: Diagnosis not present

## 2016-08-30 ENCOUNTER — Encounter: Payer: Self-pay | Admitting: Family Medicine

## 2016-08-30 DIAGNOSIS — K5903 Drug induced constipation: Secondary | ICD-10-CM | POA: Insufficient documentation

## 2016-08-30 DIAGNOSIS — K219 Gastro-esophageal reflux disease without esophagitis: Secondary | ICD-10-CM

## 2016-08-30 HISTORY — DX: Gastro-esophageal reflux disease without esophagitis: K21.9

## 2016-09-10 ENCOUNTER — Ambulatory Visit (INDEPENDENT_AMBULATORY_CARE_PROVIDER_SITE_OTHER): Payer: Medicare Other | Admitting: Family Medicine

## 2016-09-10 ENCOUNTER — Encounter: Payer: Self-pay | Admitting: Family Medicine

## 2016-09-10 VITALS — BP 161/73 | HR 52 | Temp 98.7°F | Resp 16 | Ht 68.5 in | Wt 218.2 lb

## 2016-09-10 DIAGNOSIS — Z7901 Long term (current) use of anticoagulants: Secondary | ICD-10-CM | POA: Diagnosis not present

## 2016-09-10 DIAGNOSIS — G8929 Other chronic pain: Secondary | ICD-10-CM | POA: Diagnosis not present

## 2016-09-10 DIAGNOSIS — I10 Essential (primary) hypertension: Secondary | ICD-10-CM

## 2016-09-10 DIAGNOSIS — M545 Low back pain, unspecified: Secondary | ICD-10-CM

## 2016-09-10 DIAGNOSIS — I480 Paroxysmal atrial fibrillation: Secondary | ICD-10-CM | POA: Diagnosis not present

## 2016-09-10 DIAGNOSIS — M7062 Trochanteric bursitis, left hip: Secondary | ICD-10-CM | POA: Diagnosis not present

## 2016-09-10 DIAGNOSIS — Z5181 Encounter for therapeutic drug level monitoring: Secondary | ICD-10-CM

## 2016-09-10 DIAGNOSIS — E7800 Pure hypercholesterolemia, unspecified: Secondary | ICD-10-CM | POA: Diagnosis not present

## 2016-09-10 HISTORY — DX: Trochanteric bursitis, left hip: M70.62

## 2016-09-10 HISTORY — DX: Long term (current) use of anticoagulants: Z79.01

## 2016-09-10 HISTORY — DX: Pure hypercholesterolemia, unspecified: E78.00

## 2016-09-10 HISTORY — DX: Paroxysmal atrial fibrillation: I48.0

## 2016-09-10 LAB — CBC
HEMATOCRIT: 41.9 % (ref 34.0–46.6)
Hemoglobin: 13.5 g/dL (ref 11.1–15.9)
MCH: 29.2 pg (ref 26.6–33.0)
MCHC: 32.2 g/dL (ref 31.5–35.7)
MCV: 91 fL (ref 79–97)
PLATELETS: 303 10*3/uL (ref 150–379)
RBC: 4.62 x10E6/uL (ref 3.77–5.28)
RDW: 13.7 % (ref 12.3–15.4)
WBC: 6.9 10*3/uL (ref 3.4–10.8)

## 2016-09-10 LAB — LIPID PANEL
CHOL/HDL RATIO: 4.7 ratio — AB (ref 0.0–4.4)
Cholesterol, Total: 290 mg/dL — ABNORMAL HIGH (ref 100–199)
HDL: 62 mg/dL (ref >39)
LDL CALC: 198 mg/dL — AB (ref 0–99)
Triglycerides: 148 mg/dL (ref 0–149)
VLDL CHOLESTEROL CAL: 30 mg/dL (ref 5–40)

## 2016-09-10 LAB — COMPREHENSIVE METABOLIC PANEL
ALBUMIN: 4 g/dL (ref 3.5–4.8)
ALT: 11 IU/L (ref 0–32)
AST: 15 IU/L (ref 0–40)
Albumin/Globulin Ratio: 1.7 (ref 1.2–2.2)
Alkaline Phosphatase: 74 IU/L (ref 39–117)
BILIRUBIN TOTAL: 0.4 mg/dL (ref 0.0–1.2)
BUN / CREAT RATIO: 26 (ref 12–28)
BUN: 32 mg/dL — AB (ref 8–27)
CALCIUM: 9.2 mg/dL (ref 8.7–10.3)
CHLORIDE: 105 mmol/L (ref 96–106)
CO2: 22 mmol/L (ref 20–29)
Creatinine, Ser: 1.22 mg/dL — ABNORMAL HIGH (ref 0.57–1.00)
GFR, EST AFRICAN AMERICAN: 51 mL/min/{1.73_m2} — AB (ref >59)
GFR, EST NON AFRICAN AMERICAN: 44 mL/min/{1.73_m2} — AB (ref >59)
GLUCOSE: 84 mg/dL (ref 65–99)
Globulin, Total: 2.4 g/dL (ref 1.5–4.5)
Potassium: 5.2 mmol/L (ref 3.5–5.2)
Sodium: 141 mmol/L (ref 134–144)
TOTAL PROTEIN: 6.4 g/dL (ref 6.0–8.5)

## 2016-09-10 MED ORDER — METOPROLOL TARTRATE 25 MG PO TABS: 12.5000 mg | ORAL_TABLET | Freq: Two times a day (BID) | ORAL | 0 refills | Status: DC

## 2016-09-10 MED ORDER — LISINOPRIL 10 MG PO TABS: 5.0000 mg | ORAL_TABLET | Freq: Every day | ORAL | 0 refills | Status: DC

## 2016-09-10 MED ORDER — DICLOFENAC SODIUM 75 MG PO TBEC: 75.0000 mg | DELAYED_RELEASE_TABLET | Freq: Every day | ORAL | 1 refills | Status: DC

## 2016-09-10 NOTE — Patient Instructions (Signed)
   IF you received an x-ray today, you will receive an invoice from [facility]. Please contact [facility] at [phone] with questions or concerns regarding your invoice.   IF you received labwork today, you will receive an invoice from [facility]. Please contact [facility] at [phone] with questions or concerns regarding your invoice.   Our billing staff will not be able to assist you with questions regarding bills from these companies.  You will be contacted with the lab results as soon as they are available. The fastest way to get your results is to activate your My Chart account. Instructions are located on the last page of this paperwork. If you have not heard from us regarding the results in 2 weeks, please contact this office.     Trochanteric Bursitis Rehab Ask your health care provider which exercises are safe for you. Do exercises exactly as told by your health care provider and adjust them as directed. It is normal to feel mild stretching, pulling, tightness, or discomfort as you do these exercises, but you should stop right away if you feel sudden pain or your pain gets worse.Do not begin these exercises until told by your health care provider. Stretching exercises These exercises warm up your muscles and joints and improve the movement and flexibility of your hip. These exercises also help to relieve pain and stiffness. Exercise A: Iliotibial band stretch  1. Lie on your side with your left / right leg in the top position. 2. Bend your left / right knee and grab your ankle. 3. Slowly bring your knee back so your thigh is behind your body. 4. Slowly lower your knee toward the floor until you feel a gentle stretch on the outside of your left / right thigh. If you do not feel a stretch and your knee will not fall farther, place the heel of your other foot on top of your outer knee and pull your thigh down farther. 5. Hold this position for __________  seconds. 6. Slowly return to the starting position. Repeat __________ times. Complete this exercise __________ times a day. Strengthening exercises These exercises build strength and endurance in your hip and pelvis. Endurance is the ability to use your muscles for a long time, even after they get tired. Exercise B: Bridge ( hip extensors) 1. Lie on your back on a firm surface with your knees bent and your feet flat on the floor. 2. Tighten your buttocks muscles and lift your buttocks off the floor until your trunk is level with your thighs. You should feel the muscles working in your buttocks and the back of your thighs. If this exercise is too easy, try doing it with your arms crossed over your chest. 3. Hold this position for __________ seconds. 4. Slowly return to the starting position. 5. Let your muscles relax completely between repetitions. Repeat __________ times. Complete this exercise __________ times a day. Exercise C: Squats ( knee extensors and  quadriceps) 1. Stand in front of a table, with your feet and knees pointing straight ahead. You may rest your hands on the table for balance but not for support. 2. Slowly bend your knees and lower your hips like you are going to sit in a chair. ? Keep your weight over your heels, not over your toes. ? Keep your lower legs upright so they are parallel with the table legs. ? Do not let your hips go lower than your knees. ? Do not bend lower than told by   your health care provider. ? If your hip pain increases, do not bend as low. 3. Hold this position for __________ seconds. 4. Slowly push with your legs to return to standing. Do not use your hands to pull yourself to standing. Repeat __________ times. Complete this exercise __________ times a day. Exercise D: Hip hike 1. Stand sideways on a bottom step. Stand on your left / right leg with your other foot unsupported next to the step. You can hold onto the railing or wall if needed for  balance. 2. Keeping your knees straight and your torso square, lift your left / right hip up toward the ceiling. 3. Hold this position for __________ seconds. 4. Slowly let your left / right hip lower toward the floor, past the starting position. Your foot should get closer to the floor. Do not lean or bend your knees. Repeat __________ times. Complete this exercise __________ times a day. Exercise E: Single leg stand 1. Stand near a counter or door frame that you can hold onto for balance as needed. It is helpful to stand in front of a mirror for this exercise so you can watch your hip. 2. Squeeze your left / right buttock muscles then lift up your other foot. Do not let your left / right hip push out to the side. 3. Hold this position for __________ seconds. Repeat __________ times. Complete this exercise __________ times a day. This information is not intended to replace advice given to you by your health care provider. Make sure you discuss any questions you have with your health care provider. Document Released: 04/08/2004 Document Revised: 11/06/2015 Document Reviewed: 02/14/2015 Elsevier Interactive Patient Education  2018 [employer] Trochanteric Bursitis Trochanteric bursitis is a condition that causes hip pain. Trochanteric bursitis happens when fluid-filled sacs (bursae) in the hip get irritated. Normally these sacs absorb shock and help strong bands of tissue (tendons) in your hip glide smoothly over each other and over your hip bones. What are the causes? This condition results from increased friction between the hip bones and the tendons that go over them. This condition can happen if you:  Have weak hips.  Use your hip muscles too much (overuse).  Get hit in the hip.  What increases the risk? This condition is more likely to develop in:  Women.  Adults who are middle-aged or older.  People with arthritis or a spinal condition.  People with weak buttocks muscles  (gluteal muscles).  People who have one leg that is shorter than the other.  People who participate in certain kinds of athletic activities, such as: ? Running sports, especially long-distance running. ? Contact sports, like football or martial arts. ? Sports in which falls may occur, like skiing.  What are the signs or symptoms? The main symptom of this condition is pain and tenderness over the point of your hip. The pain may be:  Sharp and intense.  Dull and achy.  Felt on the outside of your thigh.  It may increase when you:  Lie on your side.  Walk or run.  Go up on stairs.  Sit.  Stand up after sitting.  Stand for long periods of time.  How is this diagnosed? This condition may be diagnosed based on:  Your symptoms.  Your medical history.  A physical exam.  Imaging tests, such as: ? X-rays to check your bones. ? An MRI or ultrasound to check your tendons and muscles.  During your physical exam, your health care provider   will check the movement and strength of your hip. He or she may press on the point of your hip to check for pain. How is this treated? This condition may be treated by:  Resting.  Reducing your activity.  Avoiding activities that cause pain.  Using crutches, a cane, or a walker to decrease the strain on your hip.  Taking medicine to help with swelling.  Having medicine injected into the bursae to help with swelling.  Using ice, heat, and massage therapy for pain relief.  Physical therapy exercises for strength and flexibility.  Surgery (rare).  Follow these instructions at home: Activity  Rest.  Avoid activities that cause pain.  Return to your normal activities as told by your health care provider. Ask your health care provider what activities are safe for you. Managing pain, stiffness, and swelling  Take over-the-counter and prescription medicines only as told by your health care provider.  If directed, apply heat  to the injured area as told by your health care provider. ? Place a towel between your skin and the heat source. ? Leave the heat on for 20-30 minutes. ? Remove the heat if your skin turns bright red. This is especially important if you are unable to feel pain, heat, or cold. You may have a greater risk of getting burned.  If directed, apply ice to the injured area: ? Put ice in a plastic bag. ? Place a towel between your skin and the bag. ? Leave the ice on for 20 minutes, 2-3 times a day. General instructions  If the affected leg is one that you use for driving, ask your health care provider when it is safe to drive.  Use crutches, a cane, or a walker as told by your health care provider.  If one of your legs is shorter than the other, get fitted for a shoe insert.  Lose weight if you are overweight. How is this prevented?  Wear supportive footwear that is appropriate for your sport.  If you have hip pain, start any new exercise or sport slowly.  Maintain physical fitness, including: ? Strength. ? Flexibility. Contact a health care provider if:  Your pain does not improve with 2-4 weeks. Get help right away if:  You develop severe pain.  You have a fever.  You develop increased redness over your hip.  You have a change in your bowel function or bladder function.  You cannot control the muscles in your feet. This information is not intended to replace advice given to you by your health care provider. Make sure you discuss any questions you have with your health care provider. Document Released: 04/08/2004 Document Revised: 11/05/2015 Document Reviewed: 02/14/2015 Elsevier Interactive Patient Education  2018 Elsevier Inc.  

## 2016-09-10 NOTE — Progress Notes (Addendum)
By signing my name below, I, Katherine Mcpherson, attest that this documentation has been prepared under the direction and in the presence of Delman Cheadle, MD.  Electronically Signed: Verlee Monte, Medical Scribe. 09/10/16. 8:20 AM.  Subjective:    Patient ID: Katherine Mcpherson, female    DOB: Jan 28, 1946, 71 y.o.   MRN: [id number]  HPI Chief Complaint  Patient presents with  . other    Labile BP, pt saw cardologist and BP was 100/60 - was told to low lisinopril. BP high this AM   . Ankle Pain    Arthritis in right ankle - pt wants medication for pain     HPI Comments: Katherine Mcpherson is a 71 y.o. female who presents to Primary Care at [facility] complaining of multiple concerns including medication review and ankle pain. She was last seen 5 months ago. Diagnosed with new onset atrial fibrilation, and has seen cardiology who now as her on eliquis for anticoagulation and metoprolol for rate control. She is also on lisinopril 10 mg for bp control. She suffers from bilateral knee and low back arthralgias for which she was previously rx diclofenac, however antiinflammatory use discourage due to anticoagulants. She has also been on oxycodone 5, tylenol arthritis, and soma for pain control.  HTN: Pt is compliant with metoprolol BID, lisinopril, and eliquis. Her BP at home never ranges over 140/70-72 with her lowest 95/46, and at cardiologist about a week ago it was100/70. Pt reports dizziness when standing up and her cardiologist told her to discontinue lisinopril after she reported her sxs. Reports improved bruising/bleeding with eliquis. Denies epistaxis, and gum bleeds.  Lab Results  Component Value Date   CREATININE 0.90 04/06/2016   BP Readings from Last 3 Encounters:  09/10/16 (!) 161/73  04/06/16 118/76  12/19/15 130/80   Arthritis: Reports pain in her low back, left  hip, and right ankle. Reports right foot swelling for 3 weeks and took diclofenac for 7-10 days with intermittent swelling  afterwards. Pt received a bursa injection "by feel" (not ultrasound) and found relief for 2.5 weeks, when she normally has relief for months. Pt finds relief from her back pain with soma; BID on her worse days. Denies light-headedness, dizziness, feeling "loopy" when taking soma. Pt is doing 80 lbs leg presses and bikes for 15 mins. Pt is not doing bursitis exercises, but she is doing cold packs and taking diclofenac 75 mg for relief of her sxs. Pt is no longer going to PT for her bursitis, and doesn't wish to go back. She reports she always has stomach issues, but diclofenac hasn't made it worse.  Colon CA Screening: Pt is having a colonoscopy in 10 days and she plans on stopping eliquis and diclofenac 5 days prior to screening.  Patient Active Problem List   Diagnosis Date Noted  . Gastroesophageal reflux disease 08/30/2016  . Drug-induced constipation 08/30/2016  . Osteoarthritis of left knee 01/22/2013  . Osteoarthritis of right knee 07/31/2012  . HTN (hypertension) 12/30/2011  . Obesity, Class I, BMI 30-34.9 12/30/2011  . Vaginal atrophy 12/30/2011  . Menopausal state 12/30/2011  . Low back pain 12/15/2011   Past Medical History:  Diagnosis Date  . Allergy   . Anemia    "comes and goes" (01/22/2013)  . Arthritis    "qwhere" (01/22/2013)  . Chronic lower back pain    "ongoing since back OR 2013" (01/22/2013)  . Fall from slip, trip, or stumble 01/11/2013   "stubbed toe at gas pump at  [employer]" (01/22/2013)  . GERD (gastroesophageal reflux disease)   . H/O hiatal hernia   . High cholesterol   . Hypertension   . Polyp of colon 10/2009  . PONV (postoperative nausea and vomiting)    Past Surgical History:  Procedure Laterality Date  . ABDOMINAL HYSTERECTOMY  1999   TAH/BSO  . ANKLE FRACTURE SURGERY Right 1985  . ANKLE HARDWARE REMOVAL Right 1986  . COLONOSCOPY  08/2007   TUBULAR ADENOMAWITH DYSPLASIA  . FOOT FRACTURE SURGERY Left 1986   "piece of bone removed" (08/03/2012)  .  FRACTURE SURGERY Bilateral   . HAMMER TOE SURGERY Right 2002  . JOINT REPLACEMENT    . KNEE ARTHROSCOPY Left 2001  . POSTERIOR LUMBAR FUSION  2013   L 5-S1  . RADIOFREQUENCY ABLATION NERVES     6 week ago 07/31/14  . SPINE SURGERY  01/2012  . TOTAL KNEE ARTHROPLASTY Right 07/31/2012   Procedure: TOTAL KNEE ARTHROPLASTY;  Surgeon: Kerin Salen, MD;  Location: [place];  Service: Orthopedics;  Laterality: Right;  DEPUY/SIGMA  . TOTAL KNEE ARTHROPLASTY Left 01/22/2013  . TOTAL KNEE ARTHROPLASTY Left 01/22/2013   Procedure: LEFT TOTAL KNEE ARTHROPLASTY;  Surgeon: Kerin Salen, MD;  Location: [place];  Service: Orthopedics;  Laterality: Left;   Allergies  Allergen Reactions  . Sulfa Antibiotics Other (See Comments)    Makes tongue feel funny and makes her feel like she will pass  Out.  Last for days.  . Penicillins Itching    Childhood allergy.   . Latex Rash    Had rash on lft side only after back surgery   Prior to Admission medications   Medication Sig Start Date End Date Taking? Authorizing Provider  acetaminophen (TYLENOL) 650 MG CR tablet Take 650 mg by mouth 2 (two) times daily as needed for pain.   Yes [provider]  carisoprodol (SOMA) 350 MG tablet Take 1 tablet (350 mg total) by mouth 3 (three) times daily as needed for muscle spasms. 01/22/13  Yes Leighton Parody, PA-C  diclofenac (VOLTAREN) 75 MG EC tablet Take 75 mg by mouth daily. Reported on 08/25/2015   Yes [provider]  ELIQUIS 5 MG TABS tablet Take 5 mg by mouth daily. 06/30/16  Yes [provider]  Famotidine (PEPCID PO) Take by mouth.   Yes [provider]  lisinopril (PRINIVIL,ZESTRIL) 10 MG tablet Take 1 tablet (10 mg total) by mouth daily. 08/25/15  Yes Jeffery, Chelle, PA-C  loratadine (CLARITIN) 10 MG tablet Take 10 mg by mouth daily as needed for allergies.   Yes [provider]  metoprolol tartrate (LOPRESSOR) 25 MG tablet Take 1 tablet (25 mg total) by mouth 2 (two)  times daily. 04/06/16  Yes Shawnee Knapp, MD  OVER THE COUNTER MEDICATION Take 2 capsules by mouth daily. CholestOff   Yes [provider]  oxycodone (OXY-IR) 5 MG capsule Take 5 mg by mouth every 4 (four) hours as needed for pain.   Yes [provider]  triamcinolone (NASACORT AQ) 55 MCG/ACT AERO nasal inhaler Place 2 sprays into the nose daily. 04/21/15  Yes Posey Boyer, MD  B Complex Vitamins (VITAMIN-B COMPLEX) TABS Take by mouth.    [provider]  GARLIC PO Take by mouth.    [provider]  TURMERIC PO Take by mouth.    [provider]   Social History   Social History  . Marital status: Single    Spouse name: N/A  .  Number of children: N/A  . Years of education: N/A   Occupational History  . retired    Social History Main Topics  . Smoking status: Former Smoker    Packs/day: 0.50    Years: 8.00    Types: Cigarettes    Quit date: 03/15/1972  . Smokeless tobacco: Never Used  . Alcohol use Yes     Comment: 01/22/2013 "maybe 2 glasses of wine/month"  . Drug use: No  . Sexual activity: No   Other Topics Concern  . Not on file   Social History Narrative   Exercise riding bike at [employer] and walking   Depression screen [facility] 2/9 09/10/2016 04/06/2016 12/15/2015 12/08/2015 08/25/2015  Decreased Interest 0 0 0 0 0  Down, Depressed, Hopeless 0 0 0 0 0  PHQ - 2 Score 0 0 0 0 0    Review of Systems  Gastrointestinal: Negative for abdominal pain.  Musculoskeletal: Positive for arthralgias and back pain.  Neurological: Positive for dizziness. Negative for light-headedness.  Hematological: Does not bruise/bleed easily.  Psychiatric/Behavioral: Negative for agitation and confusion.   Objective:  Physical Exam  Constitutional: She appears well-developed and well-nourished. No distress.  HENT:  Head: Normocephalic and atraumatic.  Eyes: Conjunctivae are normal.  Neck: Neck supple. No thyromegaly present.  Cardiovascular:  Regular rhythm, S1 normal and S2 normal.  Bradycardia present.  Exam reveals no gallop and no friction rub.   No murmur heard. Pulmonary/Chest: Effort normal and breath sounds normal. No respiratory distress. She has no decreased breath sounds. She has no wheezes. She has no rhonchi. She has no rales.  Lymphadenopathy:    She has no cervical adenopathy.  Neurological: She is alert.  Skin: Skin is warm and dry.  Psychiatric: She has a normal mood and affect. Her behavior is normal.  Nursing note and vitals reviewed.   Vitals:   09/10/16 0816  BP: (!) 161/73  Pulse: (!) 52  Resp: 16  Temp: 98.7 F (37.1 C)  TempSrc: Oral  SpO2: 98%  Weight: 218 lb 3.2 oz (99 kg)  Height: 5' 8.5" (1.74 m)   Body mass index is 32.69 kg/m.   BP Readings from Last 3 Encounters:  09/10/16 (!) 161/73  04/06/16 118/76  12/19/15 130/80   Assessment & Plan:   1. Essential hypertension - suspect white coat HTN - very well controlled at home, con to monitor. Has been 100s-140s/60-80 but most 110s/60s after decreasing lisinopril (32m->5mg) and metoprolol (->12.5 bid) by half  2. Chronic bilateral low back pain without sciatica   3. Pure hypercholesterolemia - rec statin but pt refuses due to concern for worsening arthralgias as side effect. Used cholestoff as otc suppplement prior which lowered LDL 222-> 166. Pt chooses to work on diet.  4. Medication monitoring encounter   5. Trochanteric bursitis of left hip - eGFR prior 60-70 and BP prev well controlled so ok to use short term nsaids prn - monitor BP.  COuld consider the zorvolex type of micronized diclofenac which is supposed to have greater efficacy at a lower total dose than the generic.  6. Paroxysmal atrial fibrillation (HCC) - rate to slow maybe exacerbating fatigue and orthostatic sxs - often in 40s-50s  7. Long term current use of anticoagulant therapy     Orders Placed This Encounter  Procedures  . Comprehensive metabolic panel    Order  Specific Question:   Has the patient fasted?    Answer:   Yes  . Lipid panel  Order Specific Question:   Has the patient fasted?    Answer:   Yes  . CBC  . Care order/instruction:    Scheduling Instructions:     Recheck BP  . Care order/instruction:    Scheduling Instructions:     Complete orders, AVS and go.    Meds ordered this encounter  Medications  . ELIQUIS 5 MG TABS tablet    Sig: Take 5 mg by mouth daily.  Marland Kitchen lisinopril (PRINIVIL,ZESTRIL) 10 MG tablet    Sig: Take 0.5 tablets (5 mg total) by mouth daily.    Dispense:  30 tablet    Refill:  0  . metoprolol tartrate (LOPRESSOR) 25 MG tablet    Sig: Take 0.5 tablets (12.5 mg total) by mouth 2 (two) times daily.    Dispense:  60 tablet    Refill:  0  . diclofenac (VOLTAREN) 75 MG EC tablet    Sig: Take 1 tablet (75 mg total) by mouth daily. Reported on 08/25/2015    Dispense:  30 tablet    Refill:  1    I personally performed the services described in this documentation, which was scribed in my presence. The recorded information has been reviewed and considered, and addended by me as needed.   Delman Cheadle, M.D.  Primary Care at [facility] [address] [place], [place] [address] [phone] phone [phone] fax  09/12/16 12:04 AM

## 2016-09-11 ENCOUNTER — Encounter: Payer: Self-pay | Admitting: Family Medicine

## 2016-09-13 NOTE — Progress Notes (Unsigned)
Katherine Hough MD [facility]  Current Diagnoses 1. Paroxysmal atrial fibrillation 2. Hypertensive heart disease without heart failure 3. Hyperlipidemia 4. Long term (current) use of anticoagulants 5. Obesity  Impressions/Plan Stop the lisinopril if it remains below 146 systolic

## 2016-09-16 ENCOUNTER — Encounter: Payer: Self-pay | Admitting: Family Medicine

## 2016-09-17 ENCOUNTER — Other Ambulatory Visit: Payer: Self-pay | Admitting: Family Medicine

## 2016-09-17 DIAGNOSIS — I10 Essential (primary) hypertension: Secondary | ICD-10-CM

## 2016-09-20 ENCOUNTER — Other Ambulatory Visit: Payer: Self-pay | Admitting: Physician Assistant

## 2016-09-20 ENCOUNTER — Encounter: Payer: Self-pay | Admitting: Family Medicine

## 2016-09-20 DIAGNOSIS — I10 Essential (primary) hypertension: Secondary | ICD-10-CM

## 2016-09-22 DIAGNOSIS — K5730 Diverticulosis of large intestine without perforation or abscess without bleeding: Secondary | ICD-10-CM | POA: Diagnosis not present

## 2016-09-22 DIAGNOSIS — K635 Polyp of colon: Secondary | ICD-10-CM | POA: Diagnosis not present

## 2016-09-22 DIAGNOSIS — K6389 Other specified diseases of intestine: Secondary | ICD-10-CM | POA: Diagnosis not present

## 2016-09-22 DIAGNOSIS — Z86010 Personal history of colonic polyps: Secondary | ICD-10-CM | POA: Diagnosis not present

## 2016-09-22 DIAGNOSIS — D125 Benign neoplasm of sigmoid colon: Secondary | ICD-10-CM | POA: Diagnosis not present

## 2016-09-22 DIAGNOSIS — Z1211 Encounter for screening for malignant neoplasm of colon: Secondary | ICD-10-CM | POA: Diagnosis not present

## 2016-10-15 ENCOUNTER — Encounter: Payer: Self-pay | Admitting: Physician Assistant

## 2016-10-19 ENCOUNTER — Encounter: Payer: Self-pay | Admitting: Family Medicine

## 2016-10-19 DIAGNOSIS — M545 Low back pain: Secondary | ICD-10-CM | POA: Diagnosis not present

## 2016-10-19 NOTE — Addendum Note (Signed)
Addended by: Shawnee Knapp on: 10/19/2016 04:09 PM   Modules accepted: Orders

## 2016-10-22 DIAGNOSIS — M5136 Other intervertebral disc degeneration, lumbar region: Secondary | ICD-10-CM | POA: Diagnosis not present

## 2016-10-22 DIAGNOSIS — M545 Low back pain: Secondary | ICD-10-CM | POA: Diagnosis not present

## 2016-10-22 DIAGNOSIS — M47816 Spondylosis without myelopathy or radiculopathy, lumbar region: Secondary | ICD-10-CM | POA: Diagnosis not present

## 2016-11-04 DIAGNOSIS — M545 Low back pain: Secondary | ICD-10-CM | POA: Diagnosis not present

## 2016-11-16 DIAGNOSIS — M545 Low back pain: Secondary | ICD-10-CM | POA: Diagnosis not present

## 2016-11-22 ENCOUNTER — Ambulatory Visit: Payer: Medicare Other | Admitting: Physician Assistant

## 2016-11-22 ENCOUNTER — Encounter: Payer: Self-pay | Admitting: Family Medicine

## 2016-11-22 DIAGNOSIS — Z5181 Encounter for therapeutic drug level monitoring: Secondary | ICD-10-CM

## 2016-11-22 DIAGNOSIS — I10 Essential (primary) hypertension: Secondary | ICD-10-CM

## 2016-11-23 LAB — BASIC METABOLIC PANEL
BUN/Creatinine Ratio: 25 (ref 12–28)
BUN: 27 mg/dL (ref 8–27)
CO2: 21 mmol/L (ref 20–29)
Calcium: 9.1 mg/dL (ref 8.7–10.3)
Chloride: 104 mmol/L (ref 96–106)
Creatinine, Ser: 1.1 mg/dL — ABNORMAL HIGH (ref 0.57–1.00)
GFR calc Af Amer: 58 mL/min/{1.73_m2} — ABNORMAL LOW (ref >59)
GFR calc non Af Amer: 50 mL/min/{1.73_m2} — ABNORMAL LOW (ref >59)
GLUCOSE: 86 mg/dL (ref 65–99)
POTASSIUM: 5 mmol/L (ref 3.5–5.2)
SODIUM: 141 mmol/L (ref 134–144)

## 2016-11-24 DIAGNOSIS — M545 Low back pain: Secondary | ICD-10-CM | POA: Diagnosis not present

## 2016-11-25 DIAGNOSIS — M47816 Spondylosis without myelopathy or radiculopathy, lumbar region: Secondary | ICD-10-CM | POA: Diagnosis not present

## 2016-11-25 DIAGNOSIS — M791 Myalgia: Secondary | ICD-10-CM | POA: Diagnosis not present

## 2016-11-25 DIAGNOSIS — M545 Low back pain: Secondary | ICD-10-CM | POA: Diagnosis not present

## 2016-11-25 DIAGNOSIS — M7062 Trochanteric bursitis, left hip: Secondary | ICD-10-CM | POA: Diagnosis not present

## 2016-11-29 ENCOUNTER — Encounter: Payer: Self-pay | Admitting: Family Medicine

## 2016-11-29 ENCOUNTER — Ambulatory Visit (INDEPENDENT_AMBULATORY_CARE_PROVIDER_SITE_OTHER): Payer: Medicare Other | Admitting: Family Medicine

## 2016-11-29 VITALS — BP 131/85 | HR 56 | Temp 97.5°F | Resp 18 | Ht 68.5 in | Wt 217.0 lb

## 2016-11-29 DIAGNOSIS — G8929 Other chronic pain: Secondary | ICD-10-CM | POA: Diagnosis not present

## 2016-11-29 DIAGNOSIS — I10 Essential (primary) hypertension: Secondary | ICD-10-CM

## 2016-11-29 DIAGNOSIS — Z7901 Long term (current) use of anticoagulants: Secondary | ICD-10-CM | POA: Diagnosis not present

## 2016-11-29 DIAGNOSIS — M545 Low back pain, unspecified: Secondary | ICD-10-CM

## 2016-11-29 DIAGNOSIS — I480 Paroxysmal atrial fibrillation: Secondary | ICD-10-CM | POA: Diagnosis not present

## 2016-11-29 DIAGNOSIS — N183 Chronic kidney disease, stage 3 unspecified: Secondary | ICD-10-CM

## 2016-11-29 MED ORDER — PREDNISONE 20 MG PO TABS: ORAL_TABLET | ORAL | 0 refills | Status: DC

## 2016-11-29 MED ORDER — LISINOPRIL 5 MG PO TABS: 2.5000 mg | ORAL_TABLET | Freq: Every day | ORAL | 3 refills | Status: DC

## 2016-11-29 MED ORDER — DICLOFENAC SODIUM 75 MG PO TBEC: 75.0000 mg | DELAYED_RELEASE_TABLET | Freq: Two times a day (BID) | ORAL | 1 refills | Status: DC | PRN

## 2016-11-29 NOTE — Progress Notes (Signed)
Subjective:    Patient ID: Katherine Mcpherson, female    DOB: [birth year], 71 y.o.   MRN: [id number]   No chief complaint on file.   HPI HPI Comments: Katherine Mcpherson is a 71 y.o. female who presents to Primary Care at [facility] complaining of multiple concerns including medication review and ankle pain. She was last seen 5 months ago. Diagnosed with new onset atrial fibrilation, and has seen cardiology who now as her on eliquis for anticoagulation and metoprolol for rate control. She is also on lisinopril 10 mg for bp control. She suffers from bilateral knee and low back arthralgias for which she was previously rx diclofenac, however antiinflammatory use discourage due to anticoagulants. She has also been on oxycodone 5, tylenol arthritis, and soma for pain control.  HTN/A. fib: Compliant with metoprolol BID, lisinopril, and eliquis. No sig bruising/bleeding with eliquis. Was having orthostatic sxs with BP at times 95-100/46-70 so at last visit had lisinopril and metoprolol both cut in half to lisinopril 5mg  and metoprolol 12.5 bid.  Most of the time her BP at home is <130/60-70, occ 50s  Lab Results  Component Value Date   CREATININE 1.10 (H) 11/22/2016   CREATININE 1.22 (H) 09/10/2016   CREATININE 0.90 04/06/2016   CREATININE 1.03 (H) 08/25/2015   CREATININE 0.97 (H) 04/21/2015    Lab Results  Component Value Date   GFRNONAA 50 (L) 11/22/2016   GFRNONAA 44 (L) 09/10/2016   GFRNONAA 64 04/06/2016   GFRNONAA 70 (L) 01/12/2013   GFRNONAA 89 (L) 08/02/2012     BP Readings from Last 3 Encounters:  11/29/16 131/85  09/10/16 (!) 161/73  04/06/16 118/76   Arthritis: Reports pain in her low back, left  hip, and right ankle. Reports right foot swelling for 3 weeks and took diclofenac for 7-10 days with intermittent swelling afterwards. Pt received a bursa injection "by feel" (not ultrasound) and found relief for 2.5 weeks, when she normally has relief for months. Pt finds relief from  her back pain with soma; BID on her worse days. Denies light-headedness, dizziness, feeling "loopy" when taking soma. Pt is doing 80 lbs leg presses and bikes for 15 mins. Pt is not doing bursitis exercises, but she is doing cold packs and taking diclofenac 75 mg for relief of her sxs. Pt is no longer going to PT for her bursitis, and doesn't wish to go back. She reports she always has stomach issues, but diclofenac hasn't made it worse.  Her BP went up because she was in so much pain. She can't walk because her pain is so bad. Doing PT for [place] for scoliosis. Taking diclofenac prn - started the day after he had a low back massage vs changing with the air pressure - today in the firest day she has been able to move.  Tried any oxycodone on Friday which didn't help it. Going to back to see Dr. Jaymes Graff on Friday and going to do a nerve injection but not sure if she needs additional testing first - suspects it is being caused by arthritis/degenerative. Did have a second opinion by Dr. Lynann Bologna at [place] who thought it had worsened though Dr. Jaymes Graff had not [place] for bursitis in shoulder prior. Has been trying to stick to 1000KCal/d and weight won't disappear. Has an appointment with Dr. Wynonia Lawman in November. She had one incident of a. Fib treated with another 1/2 tab. The carisoprolol helps some.   She had to restart her metformin  Colon CA Screening: Pt is having a colonoscopy in 10 days and she plans on stopping eliquis and diclofenac 5 days prior to screening.  Patient Active Problem List   Diagnosis Date Noted  . Long term current use of anticoagulant therapy 09/10/2016  . Trochanteric bursitis of left hip 09/10/2016  . Paroxysmal atrial fibrillation ([place]) 09/10/2016  . Pure hypercholesterolemia 09/10/2016  . Gastroesophageal reflux disease 08/30/2016  . Drug-induced constipation 08/30/2016  . Osteoarthritis of left knee 01/22/2013  . Osteoarthritis of right knee 07/31/2012  . HTN  (hypertension) 12/30/2011  . Obesity, Class I, BMI 30-34.9 12/30/2011  . Vaginal atrophy 12/30/2011  . Menopausal state 12/30/2011  . Low back pain 12/15/2011   Past Medical History:  Diagnosis Date  . Allergy   . Anemia    "comes and goes" (01/22/2013)  . Arthritis    "qwhere" (01/22/2013)  . Chronic lower back pain    "ongoing since back OR 2013" (01/22/2013)  . Fall from slip, trip, or stumble 01/11/2013   "stubbed toe at gas pump at [place]" (01/22/2013)  . GERD (gastroesophageal reflux disease)   . H/O hiatal hernia   . High cholesterol   . Hypertension   . Polyp of colon 10/2009  . PONV (postoperative nausea and vomiting)    Past Surgical History:  Procedure Laterality Date  . ABDOMINAL HYSTERECTOMY  1999   TAH/BSO  . ANKLE FRACTURE SURGERY Right 1985  . ANKLE HARDWARE REMOVAL Right 1986  . COLONOSCOPY  08/2007   TUBULAR ADENOMAWITH DYSPLASIA  . FOOT FRACTURE SURGERY Left 1986   "piece of bone removed" (08/03/2012)  . FRACTURE SURGERY Bilateral   . HAMMER TOE SURGERY Right 2002  . JOINT REPLACEMENT    . KNEE ARTHROSCOPY Left 2001  . POSTERIOR LUMBAR FUSION  2013   L 5-S1  . RADIOFREQUENCY ABLATION NERVES     6 week ago 07/31/14  . SPINE SURGERY  01/2012  . TOTAL KNEE ARTHROPLASTY Right 07/31/2012   Procedure: TOTAL KNEE ARTHROPLASTY;  Surgeon: Kerin Salen, MD;  Location: [facility];  Service: Orthopedics;  Laterality: Right;  DEPUY/SIGMA  . TOTAL KNEE ARTHROPLASTY Left 01/22/2013  . TOTAL KNEE ARTHROPLASTY Left 01/22/2013   Procedure: LEFT TOTAL KNEE ARTHROPLASTY;  Surgeon: Kerin Salen, MD;  Location: [facility];  Service: Orthopedics;  Laterality: Left;   Allergies  Allergen Reactions  . Sulfa Antibiotics Other (See Comments)    Makes tongue feel funny and makes her feel like she will pass  Out.  Last for days.  . Penicillins Itching    Childhood allergy.   . Latex Rash    Had rash on lft side only after back surgery   Prior to Admission medications   Medication  Sig Start Date End Date Taking? Authorizing Provider  acetaminophen (TYLENOL) 650 MG CR tablet Take 650 mg by mouth 2 (two) times daily as needed for pain.   Yes [provider]  carisoprodol (SOMA) 350 MG tablet Take 1 tablet (350 mg total) by mouth 3 (three) times daily as needed for muscle spasms. 01/22/13  Yes Leighton Parody, PA-C  diclofenac (VOLTAREN) 75 MG EC tablet Take 75 mg by mouth daily. Reported on 08/25/2015   Yes [provider]  ELIQUIS 5 MG TABS tablet Take 5 mg by mouth daily. 06/30/16  Yes [provider]  Famotidine (PEPCID PO) Take by mouth.   Yes [provider]  lisinopril (PRINIVIL,ZESTRIL) 10 MG tablet Take 1 tablet (10 mg total) by mouth daily.  08/25/15  Yes Jeffery, Chelle, PA-C  loratadine (CLARITIN) 10 MG tablet Take 10 mg by mouth daily as needed for allergies.   Yes [provider]  metoprolol tartrate (LOPRESSOR) 25 MG tablet Take 1 tablet (25 mg total) by mouth 2 (two) times daily. 04/06/16  Yes Shawnee Knapp, MD  OVER THE COUNTER MEDICATION Take 2 capsules by mouth daily. CholestOff   Yes [provider]  oxycodone (OXY-IR) 5 MG capsule Take 5 mg by mouth every 4 (four) hours as needed for pain.   Yes [provider]  triamcinolone (NASACORT AQ) 55 MCG/ACT AERO nasal inhaler Place 2 sprays into the nose daily. 04/21/15  Yes Posey Boyer, MD  B Complex Vitamins (VITAMIN-B COMPLEX) TABS Take by mouth.    [provider]  GARLIC PO Take by mouth.    [provider]  TURMERIC PO Take by mouth.    [provider]   Social History   Social History  . Marital status: Single    Spouse name: N/A  . Number of children: N/A  . Years of education: N/A   Occupational History  . retired    Social History Main Topics  . Smoking status: Former Smoker    Packs/day: 0.50    Years: 8.00    Types: Cigarettes    Quit date: 03/15/1972  . Smokeless tobacco: Never Used  . Alcohol use Yes       Comment: 01/22/2013 "maybe 2 glasses of wine/month"  . Drug use: No  . Sexual activity: No   Other Topics Concern  . Not on file   Social History Narrative   Exercise riding bike at [employer] and walking   Depression screen [facility] 2/9 09/10/2016 04/06/2016 12/15/2015 12/08/2015 08/25/2015  Decreased Interest 0 0 0 0 0  Down, Depressed, Hopeless 0 0 0 0 0  PHQ - 2 Score 0 0 0 0 0    Review of Systems  Gastrointestinal: Negative for abdominal pain.  Musculoskeletal: Positive for arthralgias and back pain.  Neurological: Positive for dizziness. Negative for light-headedness.  Hematological: Does not bruise/bleed easily.  Psychiatric/Behavioral: Negative for agitation and confusion.  occ heartburn with gerd treated pepcid successfully.  Objective:  Physical Exam  Constitutional: She appears well-developed and well-nourished. No distress.  HENT:  Head: Normocephalic and atraumatic.  Eyes: Conjunctivae are normal.  Neck: Neck supple. No thyromegaly present.  Cardiovascular: Regular rhythm, S1 normal and S2 normal.  Bradycardia present.  Exam reveals no gallop and no friction rub.   No murmur heard. Pulmonary/Chest: [place] normal and breath sounds normal. No respiratory distress. She has no decreased breath sounds. She has no wheezes. She has no rhonchi. She has no rales.  Lymphadenopathy:    She has no cervical adenopathy.  Neurological: She is alert.  Skin: Skin is warm and dry.  Psychiatric: She has a normal mood and affect. Her behavior is normal.  Nursing note and vitals reviewed.   Vitals:   11/29/16 0910 11/29/16 0930  BP: 140/79 131/85  Pulse: (!) 56   Resp: 18   Temp: (!) 97.5 F (36.4 C)   TempSrc: Oral   SpO2: 100%   Weight: 217 lb (98.4 kg)   Height: 5' 8.5" (1.74 m)    Body mass index is 32.51 kg/m.   BP Readings from Last 3 Encounters:  09/10/16 (!) 161/73  04/06/16 118/76  12/19/15 130/80   Assessment & Plan:    1. Stage 3 chronic kidney disease  -  recheck when very well hydrated and off of nsaids, mild, ok to cont prn diclofenac as only uses rarely when back pain really flairs which is unbearable - unable to move w/o.  2. Essential hypertension - still was hypotensive until BP increased from pain - cut lisinopril in half.  Having difficulty loosing weight since starting on metoprolol - will discuss poss of changing this to other rate control agent with cardiologist Dr. Wynonia Lawman at upcoming appt.  3. Chronic bilateral low back pain without sciatica - seeing ortho and PT currently severely flaired for sev d and has been trying to limit diclofenac  - try pred burst followed by prn diclofenac  4. Long term current use of anticoagulant therapy   5. Paroxysmal atrial fibrillation ([place])      Orders Placed This Encounter  Procedures  . Basic metabolic panel    Standing Status:   Future    Standing Expiration Date:   11/29/2017    Order Specific Question:   Has the patient fasted?    Answer:   No    Meds ordered this encounter  Medications  . lisinopril (PRINIVIL,ZESTRIL) 5 MG tablet    Sig: Take 0.5 tablets (2.5 mg total) by mouth daily.    Dispense:  45 tablet    Refill:  3  . predniSONE (DELTASONE) 20 MG tablet    Sig: Take 3 tabs qd x 2d, then 2 tabs qd x 2d then 1 tab qd x 2d.    Dispense:  23 tablet    Refill:  0  . diclofenac (VOLTAREN) 75 MG EC tablet    Sig: Take 1 tablet (75 mg total) by mouth 2 (two) times daily as needed for moderate pain.    Dispense:  60 tablet    Refill:  1    Delman Cheadle, M.D.  Primary Care at [facility] [address] [place], [place] [address] [phone] phone [phone] fax  11/29/16 8:19 AM

## 2016-11-29 NOTE — Patient Instructions (Addendum)
Whenever your pain resolves in your blood pressure goes down again, decrease your lisinopril to half a tab a day.  I think it is fine to continue the diclofenac as needed after the prednisone course is complete.  I do recommend asking Dr. Wynonia Lawman whether there might be a rate control medication for your atrial fibrillation that might have less effect on your metabolism and weight loss. I am not sure whether the sister medications have less effect on this or not.    IF you received an x-ray today, you will receive an invoice from [facility]. Please contact [facility] at [phone] with questions or concerns regarding your invoice.   IF you received labwork today, you will receive an invoice from [place]. Please contact [facility] at [phone] with questions or concerns regarding your invoice.   Our billing staff will not be able to assist you with questions regarding bills from these companies.  You will be contacted with the lab results as soon as they are available. The fastest way to get your results is to activate your My Chart account. Instructions are located on the last page of this paperwork. If you have not heard from [place] regarding the results in 2 weeks, please contact this office.     Preventing Chronic Kidney Disease Chronic kidney disease (CKD) occurs when the kidneys are damaged for at least 3 months and do not function effectively. The kidneys are two organs that do many important jobs in the body, including:  Removing waste and extra fluids from the blood.  Regulating hormones, blood pressure, and blood chemistry.  At first, you may not notice any signs or symptoms of CKD. However, CKD gets worse over time (is progressive). You can prevent CKD or keep CKD from progressing by making certain changes to your lifestyle and nutrition. Risk factorsfor CKD include:  Being age 60 or older.  Being female.  Having any of the following: ? Diabetes. ? High  blood pressure. ? Heart disease. ? An autoimmune disease. ? Frequent urinary tract infections. ? Polycystic kidney disease. ? A family history of kidney disease, heart disease, diabetes, or high blood pressure.  Having problems with urine flow that may be caused by: ? Cancer. ? Having kidney stones more than once. ? An enlarged prostate in males.  Being of African-American, Native American, Hispanic, Asian, or [place] descent.  Being obese.  Long-term use of NSAIDs.  Current or former tobacco use.  What types of nutrition changes can I make? A balanced meal plan can help keep your kidneys healthy and prevent CKD. A balanced meal plan may involve:  Limiting salt (sodium) intake. You should have less than 1 tsp (2,300 mg) of sodium per day. If you have heart disease or high blood pressure, you should have less than  tsp (1,500 mg) of sodium per day.  Limiting alcohol intake to no more than 1 drink a day for nonpregnant women and 2 drinks a day for men. One drink equals 12 oz of beer, 5 oz of wine, or 1 oz of hard liquor.  If you have diabetes, work with a Financial planner (Firefighter) or a certified diabetes educator to develop a healthy eating plan. What types of lifestyle changes can I make? Lifestyle changes that can help prevent CKD include:  Talking with your health care provider about how much fluid you should drink each day.  Working with your health care provider to manage your blood pressure. This includes healthy eating, regular exercise,  and taking any medicines that are prescribed for you.  Exercisingfor at least 30 minutes on five or more days of the week, or as much as told by your health care provider.  Keeping your weight at a healthy level. If you are overweight or obese, lose weight as told by your health care provider.  Not smoking or using any tobacco products, such as cigarettes, chewing tobacco, and e-cigarettes. If you need help  quitting, ask your health care provider.  Having a yearly physical exam.  Learning about your family's medical history. Talk to your relatives and siblings about diabetes, heart disease, and high blood pressure.  Using NSAIDs for pain only when necessary. Ask your health care provider about other pain medicines that do not increase your risk of developing CKD.  If you have diabetes, managing your diabetes with a healthy meal plan and physical activity can help prevent CKD. Work with your health care provider to manage your condition. What can happen if changes are not made? If you do not make lifestyle and nutrition changes to protect your kidneys, you may develop CKD, which can lead to:  A low red blood cell count (anemia).  Heart disease.  Weak bones.  Nerve damage (neuropathy).  Stroke.  Kidney failure and dialysis.  What can I do to lower my risk? Talk to your health care provider about your kidney health and your risk factors for CKD. The most important steps to take to lower your risk of CKD are:  Getting high blood pressure down to the target that your health care provider recommends.  Getting blood sugar (glucose) levels down to the target that your health care provider recommends.  Eating less sodium.  What are my treatment options for CKD?  Treatment for CKD may include:  Medicines that: ? Control high blood pressure, such as ACE inhibitors. ? Control blood glucose levels. ? Control cholesterol levels. ? Help your body get rid of excess water (diuretics). ? Help protect your bones.  Lifestyle changes. You may need to: ? Eat less salt. ? Eat less protein. ? Follow a heart-healthy meal plan. ? Quit smoking. ? Avoid phosphorus. Phosphorous is found in dark-colored sodas and canned ice teas. ? Avoid potassium. Potassium is found in some juices, especially orange juice. ? Avoid alcohol.  Dialysis. This is when a machine filters your blood if your kidney  fails.  Kidney transplant, if your kidney fails.  Where can I get more information? Learn more about CKD and how to prevent CKD from:  The [place]: www.kidney.org  The American Association of Kidney Patients: BombTimer.gl  The American Diabetes Association: www.diabetes.org  Summary You may not notice symptoms of CKD until your kidneys have already been damaged. The best way to prevent kidney damage is to know your risk factors and make nutrition and lifestyle changes before you develop symptoms of CKD. This information is not intended to replace advice given to you by your health care provider. Make sure you discuss any questions you have with your health care provider. Document Released: 03/28/2015 Document Revised: 11/12/2015 Document Reviewed: 01/27/2015 Elsevier Interactive Patient Education  2017 Elsevier Inc.  Chronic Kidney Disease, Adult Chronic kidney disease (CKD) occurs when the kidneys are damaged during a period of 3 or more months. The kidneys are two organs that do many important jobs in the body, which include:  Removing wastes and extra fluids from the blood.  Making hormones that maintain the amount of fluid in your tissues and  blood vessels.  Maintaining the right amount of fluids and chemicals in the body.  A small amount of kidney damage may not cause problems, but a large amount of damage may make it difficult or impossible for the kidneys to work the way they should. If steps are not taken to slow down the kidney damage or stop it from getting worse, the kidneys may stop working permanently (end stage kidney disease). Most of the time, CKD does not go away, but it can often be controlled. People who have CKD can usually live normal lives. What are the causes? The most common causes of this condition are diabetes and high blood pressure (hypertension). Other causes include:  Heart and blood vessel (cardiovascular) disease.  Kidney  diseases: ? Glomerulonephritis. ? Interstitial nephritis. ? Polycystic kidney disease. ? Renal vascular disease.  Diseases that affect the immune system.  Genetic diseases.  Medicines that damage the kidneys, such as anti-inflammatory medicines.  Poisoning.  Being around or in contact with poisonous (toxic) substances.  A kidney or urinary infection that occurs again (recurs).  Vasculitis.  Repeat kidney infections.  A problem with urine flow that may be caused by: ? Cancer. ? Having kidney stones more than one time. ? An enlarged prostate in males.  What increases the risk? This condition is more likely to develop in people who are:  Older than age 75.  Female.  Of African-American descent.  Current smokers or former smokers.  Obese.  You may also have an increased risk for CKD if you have a family history of CKDor youfrequently take medicines that are damaging to the kidneys. What are the signs or symptoms? Symptoms develop slowly and may not be obvious until the kidney damage becomes severe. It is possible to have a kidney disease for years without showing any symptoms. Symptoms of this condition can include:  Swelling (edema) of the face, legs, ankles, or feet.  Numbness, tingling, or loss of feeling (sensation) in the hands or feet.  Tiredness (lethargy).  Nausea or vomiting.  Confusion or trouble concentrating.  Problems with urination, such as: ? Painful or burning feeling during urination. ? Decreased urine production. ? Frequent urination, especially at night. ? Bloody urine.  Muscle twitches and cramps, especially in the legs.  Shortness of breath.  Weakness.  Constant itchiness.  Loss of appetite.  Metallic taste in the mouth.  Trouble sleeping.  Pale lining of the eyelids and surface of the eye (conjunctiva).  How is this diagnosed? This condition may be diagnosed with various tests. Tests may include:  Blood tests.  Urine  tests.  Imaging tests.  A test in which a sample of tissue is removed from the kidneys to be looked at under a microscope (kidney biopsy).  These test results will help your health care provider determine what class of CKD you have. How is this treated? Most cases of CKD cannot be cured. Treatment usually involves relieving symptoms and preventing or slowing the progression of the disease. Treatment may include:  A special diet, which may require you to avoid alcohol, salty foods (sodium), and foods that are high in potassium, calcium, and protein.  Medicines: ? To lower blood pressure. ? To relieve low blood count (anemia). ? To relieve swelling. ? To protect your bones. ? To improve the balance of electrolytes in your blood.  Removing toxic waste from the body by using hemodialysis or peritoneal dialysis if the kidneys can no longer do their job (kidney failure).  Management  of other conditions that are causing your CKD or making it worse.  Follow these instructions at home:  Follow your prescribed diet.  Take over-the-counter and prescription medicines only as told by your health care provider. ? Do not take any new medicines unless approved by your health care provider. Many medicines can worsen your kidney damage. ? Do not take any vitamin and mineral supplements unless approved by your health care provider. Many nutritional supplements can worsen your kidney damage. ? The dose of some medicines that you take may need to be adjusted.  Do not use any tobacco products, such as cigarettes, chewing tobacco, and e-cigarettes. If you need help quitting, ask your health care provider.  Keep all follow-up visits as told by your health care provider. This is important.  Keep track of your blood pressure. Report changes in your blood pressure as told by your health care provider.  Achieve and maintain a healthy weight. If you need help with this, ask your health care  provider.  Start or continue an exercise plan. Try to exercise at least 30 minutes a day, 5 days a week.  Stay current with immunizations as told by your health care provider. Where to find more information:  American Association of Kidney Patients: BombTimer.gl  National Kidney Foundation: www.kidney.[place]: https://mathis.com/  Life Options [facility]: www.lifeoptions.org and www.kidneyschool.org Contact a health care provider if:  Your symptoms get worse.  You develop new symptoms. Get help right away if:  You develop symptoms of end-stage kidney disease, which include: ? Headaches. ? Abnormally dark or light skin. ? Numbness in the hands or feet. ? Easy bruising. ? Frequent hiccups. ? Chest pain. ? Shortness of breath. ? End of menstruation in women.  You have a fever.  You have decreased urine production.  You have pain or bleeding when you urinate. This information is not intended to replace advice given to you by your health care provider. Make sure you discuss any questions you have with your health care provider. Document Released: 12/09/2007 Document Revised: 08/07/2015 Document Reviewed: 10/29/2011 Elsevier Interactive Patient Education  2017 [employer].

## 2016-11-30 DIAGNOSIS — M545 Low back pain: Secondary | ICD-10-CM | POA: Diagnosis not present

## 2016-12-02 DIAGNOSIS — Z23 Encounter for immunization: Secondary | ICD-10-CM | POA: Diagnosis not present

## 2016-12-07 DIAGNOSIS — M47816 Spondylosis without myelopathy or radiculopathy, lumbar region: Secondary | ICD-10-CM | POA: Diagnosis not present

## 2016-12-07 DIAGNOSIS — M545 Low back pain: Secondary | ICD-10-CM | POA: Diagnosis not present

## 2016-12-14 DIAGNOSIS — M545 Low back pain: Secondary | ICD-10-CM | POA: Diagnosis not present

## 2016-12-23 DIAGNOSIS — M545 Low back pain: Secondary | ICD-10-CM | POA: Diagnosis not present

## 2016-12-27 DIAGNOSIS — M47816 Spondylosis without myelopathy or radiculopathy, lumbar region: Secondary | ICD-10-CM | POA: Diagnosis not present

## 2016-12-30 DIAGNOSIS — M545 Low back pain: Secondary | ICD-10-CM | POA: Diagnosis not present

## 2017-01-06 DIAGNOSIS — M545 Low back pain: Secondary | ICD-10-CM | POA: Diagnosis not present

## 2017-01-11 DIAGNOSIS — M545 Low back pain: Secondary | ICD-10-CM | POA: Diagnosis not present

## 2017-01-17 DIAGNOSIS — M47816 Spondylosis without myelopathy or radiculopathy, lumbar region: Secondary | ICD-10-CM | POA: Diagnosis not present

## 2017-01-18 DIAGNOSIS — M545 Low back pain: Secondary | ICD-10-CM | POA: Diagnosis not present

## 2017-01-24 DIAGNOSIS — E668 Other obesity: Secondary | ICD-10-CM | POA: Diagnosis not present

## 2017-01-24 DIAGNOSIS — I480 Paroxysmal atrial fibrillation: Secondary | ICD-10-CM | POA: Diagnosis not present

## 2017-01-24 DIAGNOSIS — Z7901 Long term (current) use of anticoagulants: Secondary | ICD-10-CM | POA: Diagnosis not present

## 2017-01-24 DIAGNOSIS — E785 Hyperlipidemia, unspecified: Secondary | ICD-10-CM | POA: Diagnosis not present

## 2017-01-24 DIAGNOSIS — I119 Hypertensive heart disease without heart failure: Secondary | ICD-10-CM | POA: Diagnosis not present

## 2017-01-25 DIAGNOSIS — M545 Low back pain: Secondary | ICD-10-CM | POA: Diagnosis not present

## 2017-01-31 DIAGNOSIS — M545 Low back pain: Secondary | ICD-10-CM | POA: Diagnosis not present

## 2017-02-01 DIAGNOSIS — M47816 Spondylosis without myelopathy or radiculopathy, lumbar region: Secondary | ICD-10-CM | POA: Diagnosis not present

## 2017-02-02 ENCOUNTER — Encounter: Payer: Self-pay | Admitting: Family Medicine

## 2017-02-11 DIAGNOSIS — M545 Low back pain: Secondary | ICD-10-CM | POA: Diagnosis not present

## 2017-02-24 ENCOUNTER — Telehealth: Payer: Self-pay

## 2017-02-24 NOTE — Telephone Encounter (Signed)
Called to schedule AWV and received no answer.

## 2017-02-25 DIAGNOSIS — M47816 Spondylosis without myelopathy or radiculopathy, lumbar region: Secondary | ICD-10-CM | POA: Diagnosis not present

## 2017-03-23 DIAGNOSIS — M47816 Spondylosis without myelopathy or radiculopathy, lumbar region: Secondary | ICD-10-CM | POA: Diagnosis not present

## 2017-03-31 ENCOUNTER — Encounter: Payer: Self-pay | Admitting: Family Medicine

## 2017-04-06 DIAGNOSIS — M47816 Spondylosis without myelopathy or radiculopathy, lumbar region: Secondary | ICD-10-CM | POA: Diagnosis not present

## 2017-04-18 DIAGNOSIS — M47816 Spondylosis without myelopathy or radiculopathy, lumbar region: Secondary | ICD-10-CM | POA: Diagnosis not present

## 2017-05-05 DIAGNOSIS — M47816 Spondylosis without myelopathy or radiculopathy, lumbar region: Secondary | ICD-10-CM | POA: Diagnosis not present

## 2017-05-27 DIAGNOSIS — M47816 Spondylosis without myelopathy or radiculopathy, lumbar region: Secondary | ICD-10-CM | POA: Diagnosis not present

## 2017-06-06 DIAGNOSIS — H25813 Combined forms of age-related cataract, bilateral: Secondary | ICD-10-CM | POA: Diagnosis not present

## 2017-06-06 DIAGNOSIS — H11153 Pinguecula, bilateral: Secondary | ICD-10-CM | POA: Diagnosis not present

## 2017-06-20 ENCOUNTER — Encounter: Payer: Self-pay | Admitting: Family Medicine

## 2017-07-06 DIAGNOSIS — M7062 Trochanteric bursitis, left hip: Secondary | ICD-10-CM | POA: Diagnosis not present

## 2017-07-06 DIAGNOSIS — M25571 Pain in right ankle and joints of right foot: Secondary | ICD-10-CM | POA: Diagnosis not present

## 2017-07-25 DIAGNOSIS — E668 Other obesity: Secondary | ICD-10-CM | POA: Diagnosis not present

## 2017-07-25 DIAGNOSIS — I480 Paroxysmal atrial fibrillation: Secondary | ICD-10-CM | POA: Diagnosis not present

## 2017-07-25 DIAGNOSIS — E785 Hyperlipidemia, unspecified: Secondary | ICD-10-CM | POA: Diagnosis not present

## 2017-07-25 DIAGNOSIS — I119 Hypertensive heart disease without heart failure: Secondary | ICD-10-CM | POA: Diagnosis not present

## 2017-07-25 DIAGNOSIS — Z7901 Long term (current) use of anticoagulants: Secondary | ICD-10-CM | POA: Diagnosis not present

## 2017-08-19 DIAGNOSIS — M7061 Trochanteric bursitis, right hip: Secondary | ICD-10-CM | POA: Diagnosis not present

## 2017-08-19 DIAGNOSIS — M47816 Spondylosis without myelopathy or radiculopathy, lumbar region: Secondary | ICD-10-CM | POA: Diagnosis not present

## 2017-10-24 DIAGNOSIS — M7062 Trochanteric bursitis, left hip: Secondary | ICD-10-CM | POA: Diagnosis not present

## 2017-10-24 DIAGNOSIS — M47816 Spondylosis without myelopathy or radiculopathy, lumbar region: Secondary | ICD-10-CM | POA: Diagnosis not present

## 2017-11-16 DIAGNOSIS — M47816 Spondylosis without myelopathy or radiculopathy, lumbar region: Secondary | ICD-10-CM | POA: Diagnosis not present

## 2017-11-29 DIAGNOSIS — M47816 Spondylosis without myelopathy or radiculopathy, lumbar region: Secondary | ICD-10-CM | POA: Diagnosis not present

## 2017-12-06 DIAGNOSIS — Z23 Encounter for immunization: Secondary | ICD-10-CM | POA: Diagnosis not present

## 2017-12-28 DIAGNOSIS — M47816 Spondylosis without myelopathy or radiculopathy, lumbar region: Secondary | ICD-10-CM | POA: Diagnosis not present

## 2018-01-17 ENCOUNTER — Encounter: Payer: Self-pay | Admitting: Family Medicine

## 2018-01-17 DIAGNOSIS — M47816 Spondylosis without myelopathy or radiculopathy, lumbar region: Secondary | ICD-10-CM | POA: Diagnosis not present

## 2018-01-27 NOTE — Progress Notes (Signed)
Cardiology Office Note:    Date:  01/31/2018   ID:  Katherine Mcpherson, DOB 09-Jun-1945, MRN [id number]  PCP:  Shawnee Knapp, MD  Cardiologist:  Shirlee More, MD    Referring MD: Shawnee Knapp, MD    ASSESSMENT:    1. Paroxysmal atrial fibrillation (HCC)   2. Essential hypertension   3. Pure hypercholesterolemia    PLAN:    In order of problems listed above:  1. She is in the office in anticipation of epidural injection given instructions regarding management of her Eliquis copy sent to a pain specialist.  Clinically she has had no recurrence continue her beta-blocker at this time I do not think she requires an antiarrhythmic drug 2. Stable continue current treatment ACE inhibitor beta-blocker 3. Stable continue Zetia lipid profile ordered   Next appointment: 6 months   Medication Adjustments/Labs and Tests Ordered: Current medicines are reviewed at length with the patient today.  Concerns regarding medicines are outlined above.  Orders Placed This Encounter  Procedures  . Lipid Profile  . EKG 12-Lead   Meds ordered this encounter  Medications  . DISCONTD: ezetimibe (ZETIA) 10 MG tablet    Sig: Take 1 tablet (10 mg total) by mouth daily.    Dispense:  90 tablet    Refill:  1  . lisinopril (PRINIVIL,ZESTRIL) 5 MG tablet    Sig: Take 0.5 tablets (2.5 mg total) by mouth daily.    Dispense:  45 tablet    Refill:  1  . ezetimibe (ZETIA) 10 MG tablet    Sig: Take 1 tablet (10 mg total) by mouth daily.    Dispense:  30 tablet    Refill:  6    No chief complaint on file.   History of Present Illness:    Katherine Mcpherson is a 72 y.o. female with a hx of atrial fibrillation CHADs2 vasc score of 2 anticoagulated with apixaban last seen 6 months ago.. Compliance with diet, lifestyle and medications: Yes  She really is severely impaired with chronic back pain and anticipates epidural injection 02/14/2018.  Because this is spinal cord I will have her abstain from her  anticoagulant 3 days before 2 days after I think the risk of bleeding and embolism is quite low.  Also told her my opinion she can take Advil or Aleve as needed provided she takes her H2 blocker.  She had one brief episode of atrial fibrillation she took an extra dose of beta-blocker no bleeding complications were anticoagulant no chest pain shortness of breath syncope or TIA. Her LDL exceeds 190 consistent with familial hyperlipidemia statin but does agree to take Zetia and will check a lipid profile in 1 month and if not at target consider the addition of PCSK9 Past Medical History:  Diagnosis Date  . Allergy   . Anemia    "comes and goes" (01/22/2013)  . Arthritis    "qwhere" (01/22/2013)  . Chronic lower back pain    "ongoing since back OR 2013" (01/22/2013)  . Fall from slip, trip, or stumble 01/11/2013   "stubbed toe at gas pump at [place]" (01/22/2013)  . GERD (gastroesophageal reflux disease)   . H/O hiatal hernia   . High cholesterol   . Hypertension   . Polyp of colon 10/2009  . PONV (postoperative nausea and vomiting)     Past Surgical History:  Procedure Laterality Date  . ABDOMINAL HYSTERECTOMY  1999   TAH/BSO  . ANKLE FRACTURE SURGERY Right 1985  .  ANKLE HARDWARE REMOVAL Right 1986  . COLONOSCOPY  08/2007   TUBULAR ADENOMAWITH DYSPLASIA  . FOOT FRACTURE SURGERY Left 1986   "piece of bone removed" (08/03/2012)  . FRACTURE SURGERY Bilateral   . HAMMER TOE SURGERY Right 2002  . JOINT REPLACEMENT    . KNEE ARTHROSCOPY Left 2001  . POSTERIOR LUMBAR FUSION  2013   L 5-S1  . RADIOFREQUENCY ABLATION NERVES     6 week ago 07/31/14  . SPINE SURGERY  01/2012  . TOTAL KNEE ARTHROPLASTY Right 07/31/2012   Procedure: TOTAL KNEE ARTHROPLASTY;  Surgeon: Kerin Salen, MD;  Location: [place];  Service: Orthopedics;  Laterality: Right;  DEPUY/SIGMA  . TOTAL KNEE ARTHROPLASTY Left 01/22/2013  . TOTAL KNEE ARTHROPLASTY Left 01/22/2013   Procedure: LEFT TOTAL KNEE ARTHROPLASTY;   Surgeon: Kerin Salen, MD;  Location: [place];  Service: Orthopedics;  Laterality: Left;    Current Medications: Current Meds  Medication Sig  . acetaminophen (TYLENOL) 650 MG CR tablet Take 650 mg by mouth 2 (two) times daily as needed for pain.  Marland Kitchen ELIQUIS 5 MG TABS tablet Take 5 mg by mouth 2 (two) times daily.   . famotidine (PEPCID) 20 MG tablet Take 20 mg by mouth daily as needed.   Marland Kitchen lisinopril (PRINIVIL,ZESTRIL) 5 MG tablet Take 0.5 tablets (2.5 mg total) by mouth daily.  Marland Kitchen loratadine (CLARITIN) 10 MG tablet Take 10 mg by mouth daily as needed for allergies.  . metoprolol tartrate (LOPRESSOR) 25 MG tablet Take 0.5 tablets (12.5 mg total) by mouth 2 (two) times daily.  Marland Kitchen OVER THE COUNTER MEDICATION Take 2 capsules by mouth daily. CholestOff  . [DISCONTINUED] lisinopril (PRINIVIL,ZESTRIL) 5 MG tablet Take 0.5 tablets (2.5 mg total) by mouth daily.     Allergies:   Sulfa antibiotics; Penicillins; and Latex   Social History   Socioeconomic History  . Marital status: Single    Spouse name: Not on file  . Number of children: Not on file  . Years of education: Not on file  . Highest education level: Not on file  Occupational History  . Occupation: retired  Scientific laboratory technician  . Financial resource strain: Not on file  . Food insecurity:    Worry: Not on file    Inability: Not on file  . Transportation needs:    Medical: Not on file    Non-medical: Not on file  Tobacco Use  . Smoking status: Former Smoker    Packs/day: 0.50    Years: 8.00    Pack years: 4.00    Types: Cigarettes    Last attempt to quit: 03/15/1972    Years since quitting: 45.9  . Smokeless tobacco: Never Used  Substance and Sexual Activity  . Alcohol use: Yes    Comment: occ/ rare wine-2 glasses of wine per month  . Drug use: No  . Sexual activity: Never  Lifestyle  . Physical activity:    Days per week: Not on file    Minutes per session: Not on file  . Stress: Not on file  Relationships  . Social  connections:    Talks on phone: Not on file    Gets together: Not on file    Attends religious service: Not on file    Active member of club or organization: Not on file    Attends meetings of clubs or organizations: Not on file    Relationship status: Not on file  Other Topics Concern  . Not on file  Social  History Narrative   Exercise riding bike at [employer] and walking     Family History: The patient's family history includes Cancer in her paternal uncle; Heart disease in her father; Hyperlipidemia in her father; Hypertension in her father, mother, sister, and sister; Osteoporosis in her mother. ROS:   Please see the history of present illness.    All other systems reviewed and are negative.  EKGs/Labs/Other Studies Reviewed:    The following studies were reviewed today:  EKG:  EKG ordered today.  The ekg ordered today demonstrates sinus rhythm normal EKG  Recent Labs:   09/09/2016 LDL 198 cholesterol 290 No results found for requested labs within last 8760 hours.  Recent Lipid Panel    Component Value Date/Time   CHOL 290 (H) 09/10/2016 0848   TRIG 148 09/10/2016 0848   HDL 62 09/10/2016 0848   CHOLHDL 4.7 (H) 09/10/2016 0848   CHOLHDL 3.0 08/25/2015 0853   VLDL 15 08/25/2015 0853   LDLCALC 198 (H) 09/10/2016 0848    Physical Exam:    VS:  BP (!) 152/82 (BP Location: Left Arm, Patient Position: Sitting, Cuff Size: Large)   Pulse (!) 52   Ht 5\' 9"  (1.753 m)   Wt 220 lb 1.9 oz (99.8 kg)   SpO2 94%   BMI 32.51 kg/m     Wt Readings from Last 3 Encounters:  01/30/18 220 lb 1.9 oz (99.8 kg)  11/29/16 217 lb (98.4 kg)  09/10/16 218 lb 3.2 oz (99 kg)     GEN:  Well nourished, well developed in no acute distress HEENT: Normal NECK: No JVD; No carotid bruits LYMPHATICS: No lymphadenopathy CARDIAC: RRR, no murmurs, rubs, gallops RESPIRATORY:  Clear to auscultation without rales, wheezing or rhonchi  ABDOMEN: Soft, non-tender, non-distended MUSCULOSKELETAL:   No edema; No deformity  SKIN: Warm and dry NEUROLOGIC:  Alert and oriented x 3 PSYCHIATRIC:  Normal affect    Signed, Shirlee More, MD  01/31/2018 12:17 PM    [facility]

## 2018-01-30 ENCOUNTER — Ambulatory Visit (INDEPENDENT_AMBULATORY_CARE_PROVIDER_SITE_OTHER): Payer: Medicare Other | Admitting: Cardiology

## 2018-01-30 ENCOUNTER — Encounter: Payer: Self-pay | Admitting: Cardiology

## 2018-01-30 VITALS — BP 152/82 | HR 52 | Ht 69.0 in | Wt 220.1 lb

## 2018-01-30 DIAGNOSIS — I480 Paroxysmal atrial fibrillation: Secondary | ICD-10-CM

## 2018-01-30 DIAGNOSIS — I10 Essential (primary) hypertension: Secondary | ICD-10-CM | POA: Diagnosis not present

## 2018-01-30 DIAGNOSIS — E7800 Pure hypercholesterolemia, unspecified: Secondary | ICD-10-CM

## 2018-01-30 MED ORDER — EZETIMIBE 10 MG PO TABS: 10.0000 mg | ORAL_TABLET | Freq: Every day | ORAL | 1 refills | Status: DC

## 2018-01-30 MED ORDER — EZETIMIBE 10 MG PO TABS: 10.0000 mg | ORAL_TABLET | Freq: Every day | ORAL | 6 refills | Status: DC

## 2018-01-30 MED ORDER — LISINOPRIL 5 MG PO TABS: 2.5000 mg | ORAL_TABLET | Freq: Every day | ORAL | 1 refills | Status: DC

## 2018-01-30 NOTE — Patient Instructions (Addendum)
Medication Instructions:  Your physician has recommended you make the following change in your medication:  HOLD eliquis for 3 full days before your procedure then wait 2 full days after before resuming this medication as prescribed  START ezetimibe (zetia) 10 mg: Take 1 tablet daily  If you need a refill on your cardiac medications before your next appointment, please call your pharmacy.   Lab work: Your physician recommends that you return for lab work in 1 month: lipid panel. Please return to our office for lab work, no appointment needed. Please fast beforehand.   If you have labs (blood work) drawn today and your tests are completely normal, you will receive your results only by: Marland Kitchen MyChart Message (if you have MyChart) OR . A paper copy in the mail If you have any lab test that is abnormal or we need to change your treatment, we will call you to review the results.  Testing/Procedures: You had an EKG today.   Follow-Up: At [facility], you and your health needs are our priority.  As part of our continuing mission to provide you with exceptional heart care, we have created designated Provider Care Teams.  These Care Teams include your primary Cardiologist (physician) and Advanced Practice Providers (APPs -  Physician Assistants and Nurse Practitioners) who all work together to provide you with the care you need, when you need it. You will need a follow up appointment in 6 months.  Please call our office 2 months in advance to schedule this appointment.       1. Avoid all over-the-counter antihistamines except Claritin/Loratadine and Zyrtec/Cetrizine. 2. Avoid all combination including cold sinus allergies flu decongestant and sleep medications 3. You can use Robitussin DM Mucinex and Mucinex DM for cough. 4. can use Tylenol aspirin ibuprofen and naproxen but no combinations such as sleep or sinus.   Ezetimibe Tablets What is this medicine? EZETIMIBE (ez ET i mibe) blocks the  absorption of cholesterol from the stomach. It can help lower blood cholesterol for patients who are at risk of getting heart disease or a stroke. It is only for patients whose cholesterol level is not controlled by diet. This medicine may be used for other purposes; ask your health care provider or pharmacist if you have questions. COMMON BRAND NAME(S): Zetia What should I tell my health care provider before I take this medicine? They need to know if you have any of these conditions: -liver disease -an unusual or allergic reaction to ezetimibe, medicines, foods, dyes, or preservatives -pregnant or trying to get pregnant -breast-feeding How should I use this medicine? Take this medicine by mouth with a glass of water. Follow the directions on the prescription label. This medicine can be taken with or without food. Take your doses at regular intervals. Do not take your medicine more often than directed. Talk to your pediatrician regarding the use of this medicine in children. Special care may be needed. Overdosage: If you think you have taken too much of this medicine contact a poison control center or emergency room at once. NOTE: This medicine is only for you. Do not share this medicine with others. What if I miss a dose? If you miss a dose, take it as soon as you can. If it is almost time for your next dose, take only that dose. Do not take double or extra doses. What may interact with this medicine? Do not take this medicine with any of the following medications: -fenofibrate -gemfibrozil This medicine may also  interact with the following medications: -antacids -cyclosporine -herbal medicines like red yeast rice -other medicines to lower cholesterol or triglycerides This list may not describe all possible interactions. Give your health care provider a list of all the medicines, herbs, non-prescription drugs, or dietary supplements you use. Also tell them if you smoke, drink alcohol, or  use illegal drugs. Some items may interact with your medicine. What should I watch for while using this medicine? Visit your doctor or health care professional for regular checks on your progress. You will need to have your cholesterol levels checked. If you are also taking some other cholesterol medicines, you will also need to have tests to make sure your liver is working properly. Tell your doctor or health care professional if you get any unexplained muscle pain, tenderness, or weakness, especially if you also have a fever and tiredness. You need to follow a low-cholesterol, low-fat diet while you are taking this medicine. This will decrease your risk of getting heart and blood vessel disease. Exercising and avoiding alcohol and smoking can also help. Ask your doctor or dietician for advice. What side effects may I notice from receiving this medicine? Side effects that you should report to your doctor or health care professional as soon as possible: -allergic reactions like skin rash, itching or hives, swelling of the face, lips, or tongue -dark yellow or brown urine -unusually weak or tired -yellowing of the skin or eyes Side effects that usually do not require medical attention (report to your doctor or health care professional if they continue or are bothersome): -diarrhea -dizziness -headache -stomach upset or pain This list may not describe all possible side effects. Call your doctor for medical advice about side effects. You may report side effects to FDA at 1-800-FDA-1088. Where should I keep my medicine? Keep out of the reach of children. Store at room temperature between 15 and 30 degrees C (59 and 86 degrees F). Protect from moisture. Keep container tightly closed. Throw away any unused medicine after the expiration date. NOTE: This sheet is a summary. It may not cover all possible information. If you have questions about this medicine, talk to your doctor, pharmacist, or health  care provider.  2018 Elsevier/Gold Standard (2011-09-06 15:39:09)

## 2018-02-14 DIAGNOSIS — M47816 Spondylosis without myelopathy or radiculopathy, lumbar region: Secondary | ICD-10-CM | POA: Diagnosis not present

## 2018-02-28 ENCOUNTER — Ambulatory Visit: Payer: Medicare Other | Admitting: Family Medicine

## 2018-03-01 DIAGNOSIS — E7800 Pure hypercholesterolemia, unspecified: Secondary | ICD-10-CM | POA: Diagnosis not present

## 2018-03-02 LAB — LIPID PANEL
Chol/HDL Ratio: 3.5 ratio (ref 0.0–4.4)
Cholesterol, Total: 205 mg/dL — ABNORMAL HIGH (ref 100–199)
HDL: 59 mg/dL (ref >39)
LDL CALC: 121 mg/dL — AB (ref 0–99)
Triglycerides: 126 mg/dL (ref 0–149)
VLDL CHOLESTEROL CAL: 25 mg/dL (ref 5–40)

## 2018-03-14 ENCOUNTER — Ambulatory Visit (INDEPENDENT_AMBULATORY_CARE_PROVIDER_SITE_OTHER): Payer: Medicare Other | Admitting: Family Medicine

## 2018-03-14 ENCOUNTER — Encounter: Payer: Self-pay | Admitting: Family Medicine

## 2018-03-14 ENCOUNTER — Other Ambulatory Visit: Payer: Self-pay

## 2018-03-14 VITALS — BP 148/80 | HR 74 | Temp 98.0°F | Resp 16 | Ht 69.0 in | Wt 222.0 lb

## 2018-03-14 DIAGNOSIS — G8929 Other chronic pain: Secondary | ICD-10-CM

## 2018-03-14 DIAGNOSIS — J0100 Acute maxillary sinusitis, unspecified: Secondary | ICD-10-CM | POA: Diagnosis not present

## 2018-03-14 DIAGNOSIS — N183 Chronic kidney disease, stage 3 unspecified: Secondary | ICD-10-CM

## 2018-03-14 DIAGNOSIS — M1990 Unspecified osteoarthritis, unspecified site: Secondary | ICD-10-CM

## 2018-03-14 DIAGNOSIS — M5442 Lumbago with sciatica, left side: Secondary | ICD-10-CM | POA: Diagnosis not present

## 2018-03-14 DIAGNOSIS — M5441 Lumbago with sciatica, right side: Secondary | ICD-10-CM

## 2018-03-14 DIAGNOSIS — I10 Essential (primary) hypertension: Secondary | ICD-10-CM

## 2018-03-14 MED ORDER — FLUTICASONE PROPIONATE 50 MCG/ACT NA SUSP: 2.0000 | Freq: Every day | NASAL | 0 refills | Status: DC

## 2018-03-14 MED ORDER — CEFPODOXIME PROXETIL 200 MG PO TABS: 200.0000 mg | ORAL_TABLET | Freq: Two times a day (BID) | ORAL | 0 refills | Status: DC

## 2018-03-14 MED ORDER — DICLOFENAC SODIUM 25 MG PO TBEC: 25.0000 mg | DELAYED_RELEASE_TABLET | Freq: Two times a day (BID) | ORAL | 0 refills | Status: DC | PRN

## 2018-03-14 MED ORDER — DICLOFENAC 18 MG PO CAPS: 1.0000 | ORAL_CAPSULE | Freq: Every day | ORAL | 0 refills | Status: DC | PRN

## 2018-03-14 NOTE — Progress Notes (Signed)
Subjective:    Patient: Katherine Mcpherson  DOB: 21-May-1945; 72 y.o.   MRN: [id number]  Chief Complaint  Patient presents with  . Arthritis    more in the back   . Cough    with nasal congestion and drainage x 2 weeks    HPI Did have blood work with Dr. Clovis Fredrickson -  Dr. Jacelyn Grip had ablasion on bilateral about a month ago for bilateral nerve roots . Zorvolex not on formulation - would like to try a PA PrescriptionHope might be able to get her the rx for $50/mo  Take 625mg  twice a day.   107/65 at last ortho appt.  High today as in a fair amount of arthritis pain. - usually around 130s/70s  Not tried any supp  2 weeks of sinus congesterion and cough.  Cough improved today. All congestion clear. F/c initially - treated initially with coracidin hpb. Still w/ maxillary pressure B.   Had eyes checked last mo but no glaucoma.   Was given a different muscle relaxant which does not make her sleepy at all - helps some, enough.   She has plenty of all of her medicattions from [facility]  Usually goes to the gym 3x/wk x 45 min  Medical History Past Medical History:  Diagnosis Date  . Allergy   . Anemia    "comes and goes" (01/22/2013)  . Arthritis    "qwhere" (01/22/2013)  . Chronic lower back pain    "ongoing since back OR 2013" (01/22/2013)  . Fall from slip, trip, or stumble 01/11/2013   "stubbed toe at gas pump at [place]" (01/22/2013)  . GERD (gastroesophageal reflux disease)   . H/O hiatal hernia   . High cholesterol   . Hypertension   . Polyp of colon 10/2009  . PONV (postoperative nausea and vomiting)    Past Surgical History:  Procedure Laterality Date  . ABDOMINAL HYSTERECTOMY  1999   TAH/BSO  . ANKLE FRACTURE SURGERY Right 1985  . ANKLE HARDWARE REMOVAL Right 1986  . COLONOSCOPY  08/2007   TUBULAR ADENOMAWITH DYSPLASIA  . FOOT FRACTURE SURGERY Left 1986   "piece of bone removed" (08/03/2012)  . FRACTURE SURGERY Bilateral   . HAMMER TOE SURGERY Right 2002    . JOINT REPLACEMENT    . KNEE ARTHROSCOPY Left 2001  . POSTERIOR LUMBAR FUSION  2013   L 5-S1  . RADIOFREQUENCY ABLATION NERVES     6 week ago 07/31/14  . SPINE SURGERY  01/2012  . TOTAL KNEE ARTHROPLASTY Right 07/31/2012   Procedure: TOTAL KNEE ARTHROPLASTY;  Surgeon: Kerin Salen, MD;  Location: [place];  Service: Orthopedics;  Laterality: Right;  DEPUY/SIGMA  . TOTAL KNEE ARTHROPLASTY Left 01/22/2013  . TOTAL KNEE ARTHROPLASTY Left 01/22/2013   Procedure: LEFT TOTAL KNEE ARTHROPLASTY;  Surgeon: Kerin Salen, MD;  Location: [place];  Service: Orthopedics;  Laterality: Left;   Current Outpatient Medications on File Prior to Visit  Medication Sig Dispense Refill  . acetaminophen (TYLENOL) 650 MG CR tablet Take 650 mg by mouth 2 (two) times daily as needed for pain.    Marland Kitchen ELIQUIS 5 MG TABS tablet Take 5 mg by mouth 2 (two) times daily.     Marland Kitchen ezetimibe (ZETIA) 10 MG tablet Take 1 tablet (10 mg total) by mouth daily. 30 tablet 6  . famotidine (PEPCID) 20 MG tablet Take 20 mg by mouth daily as needed.     Marland Kitchen lisinopril (PRINIVIL,ZESTRIL) 5 MG tablet Take 0.5 tablets (  2.5 mg total) by mouth daily. 45 tablet 1  . loratadine (CLARITIN) 10 MG tablet Take 10 mg by mouth daily as needed for allergies.    . metoprolol tartrate (LOPRESSOR) 25 MG tablet Take 0.5 tablets (12.5 mg total) by mouth 2 (two) times daily. 60 tablet 0  . OVER THE COUNTER MEDICATION Take 2 capsules by mouth daily. CholestOff     No current facility-administered medications on file prior to visit.    Allergies  Allergen Reactions  . Sulfa Antibiotics Other (See Comments)    Makes tongue feel funny and makes her feel like she will pass  Out.  Last for days.  . Penicillins Itching    Childhood allergy.   . Latex Rash    Had rash on lft side only after back surgery   Family History  Problem Relation Age of Onset  . Osteoporosis Mother   . Hypertension Mother   . Hypertension Sister   . Hypertension Sister   . Heart  disease Father   . Hypertension Father   . Hyperlipidemia Father   . Cancer Paternal Uncle        PANCREATIC   Social History   Socioeconomic History  . Marital status: Single    Spouse name: Not on file  . Number of children: Not on file  . Years of education: Not on file  . Highest education level: Not on file  Occupational History  . Occupation: retired  Scientific laboratory technician  . Financial resource strain: Not on file  . Food insecurity:    Worry: Not on file    Inability: Not on file  . Transportation needs:    Medical: Not on file    Non-medical: Not on file  Tobacco Use  . Smoking status: Former Smoker    Packs/day: 0.50    Years: 8.00    Pack years: 4.00    Types: Cigarettes    Last attempt to quit: 03/15/1972    Years since quitting: 46.0  . Smokeless tobacco: Never Used  Substance and Sexual Activity  . Alcohol use: Yes    Comment: occ/ rare wine-2 glasses of wine per month  . Drug use: No  . Sexual activity: Never  Lifestyle  . Physical activity:    Days per week: Not on file    Minutes per session: Not on file  . Stress: Not on file  Relationships  . Social connections:    Talks on phone: Not on file    Gets together: Not on file    Attends religious service: Not on file    Active member of club or organization: Not on file    Attends meetings of clubs or organizations: Not on file    Relationship status: Not on file  Other Topics Concern  . Not on file  Social History Narrative   Exercise riding bike at [employer] and walking   Depression screen [facility] 2/9 03/14/2018 11/29/2016 09/10/2016 04/06/2016 12/15/2015  Decreased Interest 0 0 0 0 0  Down, Depressed, Hopeless 0 0 0 0 0  PHQ - 2 Score 0 0 0 0 0    ROS As noted in HPI  Objective:  BP (!) 148/80   Pulse 74   Temp 98 F (36.7 C) (Oral)   Resp 16   Ht 5\' 9"  (1.753 m)   Wt 222 lb (100.7 kg)   SpO2 99%   BMI 32.78 kg/m  Physical Exam Constitutional:      General: She is  not in acute  distress.    Appearance: She is well-developed. She is ill-appearing. She is not diaphoretic.  HENT:     Head: Normocephalic and atraumatic.     Right Ear: Ear canal and external ear normal. A middle ear effusion is present. Tympanic membrane is retracted.     Left Ear: Ear canal and external ear normal. A middle ear effusion is present. Tympanic membrane is retracted.     Nose: Mucosal edema and rhinorrhea present.     Right Sinus: Maxillary sinus tenderness present.     Left Sinus: Maxillary sinus tenderness present.     Mouth/Throat:     Pharynx: Uvula midline. Posterior oropharyngeal erythema present. No oropharyngeal exudate.     Tonsils: No tonsillar abscesses.  Eyes:     General: No scleral icterus.       Right eye: No discharge.        Left eye: No discharge.     Conjunctiva/sclera: Conjunctivae normal.  Neck:     Musculoskeletal: Normal range of motion and neck supple.     Thyroid: No thyromegaly.  Cardiovascular:     Rate and Rhythm: Normal rate and regular rhythm.     Heart sounds: Normal heart sounds.  Pulmonary:     Effort: Pulmonary effort is normal. No respiratory distress.     Breath sounds: Normal breath sounds.  Lymphadenopathy:     Head:     Right side of head: No submandibular, preauricular or posterior auricular adenopathy.     Left side of head: No submandibular, preauricular or posterior auricular adenopathy.     Cervical: No cervical adenopathy.     Upper Body:     Right upper body: No supraclavicular adenopathy.     Left upper body: No supraclavicular adenopathy.  Skin:    General: Skin is warm and dry.     Findings: No erythema.  Neurological:     Mental Status: She is alert and oriented to person, place, and time.  Psychiatric:        Behavior: Behavior normal.     [facility] Office Visit on 03/14/2018  Component Date Value Ref Range Status  . Glucose 03/14/2018 84  65 - 99 mg/dL Final  . BUN 03/14/2018 26  8 - 27 mg/dL Final  . Creatinine,  Ser 03/14/2018 1.14* 0.57 - 1.00 mg/dL Final  . GFR calc non Af Amer 03/14/2018 47* >59 mL/min/1.73 Final  . GFR calc Af Amer 03/14/2018 55* >59 mL/min/1.73 Final  . BUN/Creatinine Ratio 03/14/2018 23  12 - 28 Final  . Sodium 03/14/2018 139  134 - 144 mmol/L Final  . Potassium 03/14/2018 4.6  3.5 - 5.2 mmol/L Final  . Chloride 03/14/2018 105  96 - 106 mmol/L Final  . CO2 03/14/2018 21  20 - 29 mmol/L Final  . Calcium 03/14/2018 9.2  8.7 - 10.3 mg/dL Final  . Total Protein 03/14/2018 6.2  6.0 - 8.5 g/dL Final  . Albumin 03/14/2018 4.1  3.5 - 4.8 g/dL Final  . Globulin, Total 03/14/2018 2.1  1.5 - 4.5 g/dL Final  . Albumin/Globulin Ratio 03/14/2018 2.0  1.2 - 2.2 Final  . Bilirubin Total 03/14/2018 0.3  0.0 - 1.2 mg/dL Final  . Alkaline Phosphatase 03/14/2018 71  39 - 117 IU/L Final  . AST 03/14/2018 27  0 - 40 IU/L Final  . ALT 03/14/2018 35* 0 - 32 IU/L Final  . WBC 03/14/2018 7.4  3.4 - 10.8 x10E3/uL Final  . RBC 03/14/2018  4.30  3.77 - 5.28 x10E6/uL Final  . Hemoglobin 03/14/2018 12.8  11.1 - 15.9 g/dL Final  . Hematocrit 03/14/2018 36.8  34.0 - 46.6 % Final  . MCV 03/14/2018 86  79 - 97 fL Final  . MCH 03/14/2018 29.8  26.6 - 33.0 pg Final  . MCHC 03/14/2018 34.8  31.5 - 35.7 g/dL Final  . RDW 03/14/2018 13.0  12.3 - 15.4 % Final   Comment: **Effective March 20, 2018, the RDW pediatric reference**   interval will be removed and the adult reference interval   will be changing to:                             Female 11.7 - 15.4                                                      Female 11.6 - 15.4   . Platelets 03/14/2018 336  150 - 450 x10E3/uL Final     Assessment & Plan:   1. Stage 3 chronic kidney disease ([place])   2. Essential hypertension   3. Acute non-recurrent maxillary sinusitis   4. Chronic bilateral low back pain with bilateral sciatica   5. Arthritis   Gave SNAP in case sinus sxs worsened.  Patient will continue on current chronic medications other than changes  noted above, so ok to refill when needed.   See after visit summary for patient specific instructions.  Orders Placed This Encounter  Procedures  . Comprehensive metabolic panel  . CBC    Meds ordered this encounter  Medications  . Diclofenac (ZORVOLEX) 18 MG CAPS    Sig: Take 1 tablet by mouth daily as needed (for arthritis pain).    Dispense:  90 capsule    Refill:  0  . diclofenac (VOLTAREN) 25 MG EC tablet    Sig: Take 1-2 tablets (25-50 mg total) by mouth 2 (two) times daily as needed.    Dispense:  60 tablet    Refill:  0  . cefpodoxime (VANTIN) 200 MG tablet    Sig: Take 1 tablet (200 mg total) by mouth 2 (two) times daily.    Dispense:  20 tablet    Refill:  0  . fluticasone (FLONASE) 50 MCG/ACT nasal spray    Sig: Place 2 sprays into both nostrils at bedtime.    Dispense:  16 g    Refill:  0    Patient verbalized to me that they understand the following: diagnosis, what is being done for them, what to expect and what should be done at home.  Their questions have been answered. They understand that I am unable to predict every possible medication interaction or adverse outcome and that if any unexpected symptoms arise, they should contact us and their pharmacist, as well as never hesitate to seek urgent/emergent care at [facility] or ER if they think it might be warranted.    Delman Cheadle, MD, MPH [facility] [address] [place], [place]  [phone] Office phone  [phone] Office fax  03/14/18 3:04 PM

## 2018-03-14 NOTE — Patient Instructions (Addendum)
    https://[email or web address].org/brand-drug/[email or web address]  If you have lab work done today you will be contacted with your lab results within the next 2 weeks.  If you have not heard from [place] then please contact us. The fastest way to get your results is to register for My Chart.   IF you received an x-ray today, you will receive an invoice from [facility]. Please contact [facility] at [phone] with questions or concerns regarding your invoice.   IF you received labwork today, you will receive an invoice from [facility]. Please contact [facility] at [phone] with questions or concerns regarding your invoice.   Our billing staff will not be able to assist you with questions regarding bills from these companies.  You will be contacted with the lab results as soon as they are available. The fastest way to get your results is to activate your My Chart account. Instructions are located on the last page of this paperwork. If you have not heard from [place] regarding the results in 2 weeks, please contact this office.    Supplements for Osteoarthritis Natural anti-inflammatories can help reduce inflammation and joint stiffness without some of the harmful side effects non-steroidal anti-inflammatories (Advil, Motrin, Aleve, etc.)   Tumeric  Recommended dose 400 mg to 600 mg once daily (can cause stomach upset, may increase to three times daily as tolerated)  Do not take if you are on a blood thinner and stop prior to surgery   Ginger (root or capsules)  Recommended dose 2 grams in three divided doses or 4 cups of tea daily  Do not take if you are on a blood thinner and stop prior to surgery   Fish oil or Omega 3  Two 3 ounce servings of fish a week or flaxseed, chia seeds, walnuts and almonds  Capsules: 2-3 grams twice daily; make sure it contains at least 30% of EPA/DHA   Tart Cherry (dried or extract or tablets)  Recommended dose 500 mg twice daily   You may be able  to find some of these products at your local pharmacy but may also purchase at [employer], [employer], other specialty stores or online.   *Although these are natural products they can still interact with medications.  Always consult with your doctor or pharmacist when starting new supplements and medications*   Patient should be under the care of a physician while taking these supplements.

## 2018-03-15 LAB — CBC
HEMATOCRIT: 36.8 % (ref 34.0–46.6)
HEMOGLOBIN: 12.8 g/dL (ref 11.1–15.9)
MCH: 29.8 pg (ref 26.6–33.0)
MCHC: 34.8 g/dL (ref 31.5–35.7)
MCV: 86 fL (ref 79–97)
Platelets: 336 10*3/uL (ref 150–450)
RBC: 4.3 x10E6/uL (ref 3.77–5.28)
RDW: 13 % (ref 12.3–15.4)
WBC: 7.4 10*3/uL (ref 3.4–10.8)

## 2018-03-15 LAB — COMPREHENSIVE METABOLIC PANEL
ALT: 35 IU/L — AB (ref 0–32)
AST: 27 IU/L (ref 0–40)
Albumin/Globulin Ratio: 2 (ref 1.2–2.2)
Albumin: 4.1 g/dL (ref 3.5–4.8)
Alkaline Phosphatase: 71 IU/L (ref 39–117)
BUN/Creatinine Ratio: 23 (ref 12–28)
BUN: 26 mg/dL (ref 8–27)
Bilirubin Total: 0.3 mg/dL (ref 0.0–1.2)
CALCIUM: 9.2 mg/dL (ref 8.7–10.3)
CO2: 21 mmol/L (ref 20–29)
CREATININE: 1.14 mg/dL — AB (ref 0.57–1.00)
Chloride: 105 mmol/L (ref 96–106)
GFR calc Af Amer: 55 mL/min/{1.73_m2} — ABNORMAL LOW (ref >59)
GFR, EST NON AFRICAN AMERICAN: 47 mL/min/{1.73_m2} — AB (ref >59)
GLOBULIN, TOTAL: 2.1 g/dL (ref 1.5–4.5)
Glucose: 84 mg/dL (ref 65–99)
Potassium: 4.6 mmol/L (ref 3.5–5.2)
Sodium: 139 mmol/L (ref 134–144)
Total Protein: 6.2 g/dL (ref 6.0–8.5)

## 2018-03-16 ENCOUNTER — Encounter: Payer: Self-pay | Admitting: Family Medicine

## 2018-03-17 DIAGNOSIS — M47816 Spondylosis without myelopathy or radiculopathy, lumbar region: Secondary | ICD-10-CM | POA: Diagnosis not present

## 2018-03-24 ENCOUNTER — Other Ambulatory Visit: Payer: Self-pay

## 2018-03-24 ENCOUNTER — Telehealth: Payer: Self-pay | Admitting: Cardiology

## 2018-03-24 MED ORDER — ELIQUIS 5 MG PO TABS: 5.0000 mg | ORAL_TABLET | Freq: Two times a day (BID) | ORAL | 3 refills | Status: DC

## 2018-03-24 NOTE — Telephone Encounter (Signed)
° °  1. Which medications need to be refilled? (please list name of each medication and dose if known) Eloquis 5mg  tablet  2. Which pharmacy/location (including street and city if local pharmacy) is medication to be sent to?[facility]  3. Do they need a 30 day or 90 day supply? [place]

## 2018-03-24 NOTE — Telephone Encounter (Signed)
Refill sent task complete. 

## 2018-03-28 ENCOUNTER — Telehealth: Payer: Self-pay | Admitting: Cardiology

## 2018-03-28 ENCOUNTER — Encounter: Payer: Self-pay | Admitting: Family Medicine

## 2018-03-28 NOTE — Telephone Encounter (Signed)
Please call regarding her taking Ezetimibe, she has stuffy nose, sinus congestion, and coughing. Please advise.

## 2018-03-28 NOTE — Telephone Encounter (Signed)
Patient reports having a stuffy nose, sinus congestion, and nagging cough since before Christmas. Patient read online that these symptoms could be caused by zetia and wanted to see what Dr. Bettina Gavia thought. Patient has been on antibiotics for the past 10 days prescribed by her PCP. Advised patient to reach out to her PCP and let them know what is going on to see what they recommend as zetia is more than likely not the cause of these symptoms per Dr. Bettina Gavia. Patient verbalized understanding. No further questions.

## 2018-03-31 DIAGNOSIS — M47816 Spondylosis without myelopathy or radiculopathy, lumbar region: Secondary | ICD-10-CM | POA: Diagnosis not present

## 2018-04-14 ENCOUNTER — Other Ambulatory Visit: Payer: Self-pay | Admitting: Cardiology

## 2018-04-14 MED ORDER — ELIQUIS 5 MG PO TABS: 5.0000 mg | ORAL_TABLET | Freq: Two times a day (BID) | ORAL | 3 refills | Status: DC

## 2018-04-14 NOTE — Telephone Encounter (Signed)
Refills sent to mail order 

## 2018-04-14 NOTE — Telephone Encounter (Signed)
° °  1. Which medications need to be refilled? (please list name of each medication and dose if known) Eloquis 5mg  twice daily  2. Which pharmacy/location (including street and city if local pharmacy) is medication to be sent to?[facility]  3. Do they need a 30 day or 90 day supply? [place]

## 2018-04-18 ENCOUNTER — Telehealth: Payer: Self-pay | Admitting: Cardiology

## 2018-04-18 NOTE — Telephone Encounter (Signed)
Left message for Katherine Mcpherson to return call

## 2018-04-18 NOTE — Telephone Encounter (Signed)
Clarified eliquis refill sent in on 04/14/2018 with Melissa. Eliquis 5 mg twice daily: 180 tablets for a 90 day supply sent in with no refills as patient will be due for follow up and will get further refills during appointment. Verbal order given. No further questions.

## 2018-04-27 DIAGNOSIS — M47816 Spondylosis without myelopathy or radiculopathy, lumbar region: Secondary | ICD-10-CM | POA: Diagnosis not present

## 2018-05-08 ENCOUNTER — Encounter: Payer: Self-pay | Admitting: Family Medicine

## 2018-05-08 ENCOUNTER — Other Ambulatory Visit: Payer: Self-pay | Admitting: Family Medicine

## 2018-05-22 ENCOUNTER — Telehealth: Payer: Self-pay | Admitting: *Deleted

## 2018-05-22 ENCOUNTER — Other Ambulatory Visit: Payer: Self-pay

## 2018-05-22 MED ORDER — EZETIMIBE 10 MG PO TABS: 10.0000 mg | ORAL_TABLET | Freq: Every day | ORAL | 2 refills | Status: DC

## 2018-05-22 NOTE — Telephone Encounter (Signed)
Medication sent to pharmacy  

## 2018-05-22 NOTE — Telephone Encounter (Signed)
*  STAT* If patient is at the pharmacy, call can be transferred to refill team.   1. Which medications need to be refilled? (please list name of each medication and dose if known) Ezetimibe 10 mg qd  2. Which pharmacy/location (including street and city if local pharmacy) is medication to be sent to? [facility]  3. Do they need a 30 day or 90 day supply? [place]

## 2018-06-13 ENCOUNTER — Other Ambulatory Visit: Payer: Self-pay | Admitting: Cardiology

## 2018-06-13 DIAGNOSIS — I10 Essential (primary) hypertension: Secondary | ICD-10-CM

## 2018-06-13 NOTE — Telephone Encounter (Signed)
°*  STAT* If patient is at the pharmacy, call can be transferred to refill team.   1. Which medications need to be refilled? (please list name of each medication and dose if known) Lisinopril 5mg   2. Which pharmacy/location (including street and city if local pharmacy) is medication to be sent to? [place]  [phone]  3. Do they need a 30 day or 90 day supply? 90 day   PLEASE NOTE THIS IS A CHANGE IN [facility]

## 2018-06-14 MED ORDER — LISINOPRIL 5 MG PO TABS: 2.5000 mg | ORAL_TABLET | Freq: Every day | ORAL | 1 refills | Status: DC

## 2018-06-14 NOTE — Addendum Note (Signed)
Addended by: Jerl Santos R on: 06/14/2018 03:21 PM   Modules accepted: Orders

## 2018-06-29 DIAGNOSIS — M7062 Trochanteric bursitis, left hip: Secondary | ICD-10-CM | POA: Diagnosis not present

## 2018-07-03 ENCOUNTER — Telehealth: Payer: Self-pay | Admitting: Cardiology

## 2018-07-03 NOTE — Telephone Encounter (Signed)
°*  STAT*  Note -- pharmacy change  1. Which medications need to be refilled? (please list name of each medication and dose if known) metoprolol tartrate (LOPRESSOR) 25 MG tablet   2. Which pharmacy/location (including street and city if local pharmacy) is medication to be sent to?  [facility]) - [place], [place] [phone] (Phone) [phone] (Fax)     3. Do they need a 30 day or 90 day supply? 90day

## 2018-07-04 MED ORDER — METOPROLOL TARTRATE 25 MG PO TABS: 12.5000 mg | ORAL_TABLET | Freq: Two times a day (BID) | ORAL | 1 refills | Status: DC

## 2018-07-04 NOTE — Telephone Encounter (Signed)
Metoprolol refill sent to [place] per pt preference

## 2018-07-12 ENCOUNTER — Other Ambulatory Visit: Payer: Self-pay | Admitting: *Deleted

## 2018-07-12 NOTE — Patient Outreach (Signed)
[facility]) Care Management  07/12/2018  Katherine Mcpherson January 20, 1946 [id number]   RN Health coach sent a welcome letter. As a [employer] health plan.  Plan: Next follow up outreach within the month of June  [name] [facility] Care Management [phone]

## 2018-07-14 ENCOUNTER — Ambulatory Visit: Payer: PPO | Admitting: *Deleted

## 2018-07-17 ENCOUNTER — Telehealth: Payer: Self-pay | Admitting: *Deleted

## 2018-07-17 NOTE — Telephone Encounter (Signed)
YOUR CARDIOLOGY TEAM HAS ARRANGED FOR AN E-VISIT FOR YOUR APPOINTMENT - PLEASE REVIEW IMPORTANT INFORMATION BELOW SEVERAL DAYS PRIOR TO YOUR APPOINTMENT  Due to the recent COVID-19 pandemic, we are transitioning in-person office visits to tele-medicine visits in an effort to decrease unnecessary exposure to our patients, their families, and staff. These visits are billed to your insurance just like a normal visit is. We also encourage you to sign up for MyChart if you have not already done so. You will need a smartphone if possible. For patients that do not have this, we can still complete the visit using a regular telephone but do prefer a smartphone to enable video when possible. You may have a family member that lives with you that can help. If possible, we also ask that you have a blood pressure cuff and scale at home to measure your blood pressure, heart rate and weight prior to your scheduled appointment. Patients with clinical needs that need an in-person evaluation and testing will still be able to come to the office if absolutely necessary. If you have any questions, feel free to call our office.     YOUR PROVIDER WILL BE USING THE FOLLOWING PLATFORM TO COMPLETE YOUR VISIT: Staff: Please delete this text and fill in MyChart/Doximity/Doxy.Me  . IF USING MYCHART - How to Download the MyChart App to Your SmartPhone   - If Apple, go to App Store and type in MyChart in the search bar and download the app. If Android, ask patient to go to Google Play Store and type in MyChart in the search bar and download the app. The app is free but as with any other app downloads, your phone may require you to verify saved payment information or Apple/Android password.  - You will need to then log into the app with your MyChart username and password, and select [facility] as your healthcare provider to link the account.  - When it is time for your visit, go to the MyChart app, find appointments, and click Begin  Video Visit. Be sure to Select Allow for your device to access the Microphone and Camera for your visit. You will then be connected, and your provider will be with you shortly.  **If you have any issues connecting or need assistance, please contact MyChart service desk [phone] ([phone])**  **If using a computer, in order to ensure the best quality for your visit, you will need to use either of the following Internet Browsers: Google Chrome or Microsoft Edge**  . IF USING DOXIMITY or DOXY.ME - The staff will give you instructions on receiving your link to join the meeting the day of your visit.      2-3 DAYS BEFORE YOUR APPOINTMENT  You will receive a telephone call from one of our [facility] team members - your caller ID may say "Unknown caller." If this is a video visit, we will walk you through how to get the video launched on your phone. We will remind you check your blood pressure, heart rate and weight prior to your scheduled appointment. If you have an Apple Watch or Kardia, please upload any pertinent ECG strips the day before or morning of your appointment to MyChart. Our staff will also make sure you have reviewed the consent and agree to move forward with your scheduled tele-health visit.     THE DAY OF YOUR APPOINTMENT  Approximately 15 minutes prior to your scheduled appointment, you will receive a telephone call from one of HeartCare team -   your caller ID may say "Unknown caller."  Our staff will confirm medications, vital signs for the day and any symptoms you may be experiencing. Please have this information available prior to the time of visit start. It may also be helpful for you to have a pad of paper and pen handy for any instructions given during your visit. They will also walk you through joining the smartphone meeting if this is a video visit.    CONSENT FOR TELE-HEALTH VISIT - PLEASE REVIEW  I hereby voluntarily request, consent and authorize [facility] and  its employed or contracted physicians, physician assistants, nurse practitioners or other licensed health care professionals (the Practitioner), to provide me with telemedicine health care services (the "Services") as deemed necessary by the treating Practitioner. I acknowledge and consent to receive the Services by the Practitioner via telemedicine. I understand that the telemedicine visit will involve communicating with the Practitioner through live audiovisual communication technology and the disclosure of certain medical information by electronic transmission. I acknowledge that I have been given the opportunity to request an in-person assessment or other available alternative prior to the telemedicine visit and am voluntarily participating in the telemedicine visit.  I understand that I have the right to withhold or withdraw my consent to the use of telemedicine in the course of my care at any time, without affecting my right to future care or treatment, and that the Practitioner or I may terminate the telemedicine visit at any time. I understand that I have the right to inspect all information obtained and/or recorded in the course of the telemedicine visit and may receive copies of available information for a reasonable fee.  I understand that some of the potential risks of receiving the Services via telemedicine include:  Marland Kitchen Delay or interruption in medical evaluation due to technological equipment failure or disruption; . Information transmitted may not be sufficient (e.g. poor resolution of images) to allow for appropriate medical decision making by the Practitioner; and/or  . In rare instances, security protocols could fail, causing a breach of personal health information.  Furthermore, I acknowledge that it is my responsibility to provide information about my medical history, conditions and care that is complete and accurate to the best of my ability. I acknowledge that Practitioner's advice,  recommendations, and/or decision may be based on factors not within their control, such as incomplete or inaccurate data provided by me or distortions of diagnostic images or specimens that may result from electronic transmissions. I understand that the practice of medicine is not an exact science and that Practitioner makes no warranties or guarantees regarding treatment outcomes. I acknowledge that I will receive a copy of this consent concurrently upon execution via email to the email address I last provided but may also request a printed copy by calling the office of [facility].    I understand that my insurance will be billed for this visit.   I have read or had this consent read to me. . I understand the contents of this consent, which adequately explains the benefits and risks of the Services being provided via telemedicine.  . I have been provided ample opportunity to ask questions regarding this consent and the Services and have had my questions answered to my satisfaction. . I give my informed consent for the services to be provided through the use of telemedicine in my medical care . Pt gives consent to do a video visit.  By participating in this telemedicine visit I agree to  the above.   Cardiac Questionnaire:    Since your last visit or hospitalization:    1. Have you been having new or worsening chest pain? no   2. Have you been having new or worsening shortness of breath? no 3. Have you been having new or worsening leg swelling, wt gain, or increase in abdominal girth (pants fitting more tightly)? Leg cramps   4. Have you had any passing out spells? no    *A YES to any of these questions would result in the appointment being kept. *If all the answers to these questions are NO, we should indicate that given the current situation regarding the worldwide coronarvirus pandemic, at the recommendation of the CDC, we are looking to limit gatherings in our waiting area, and thus will  reschedule their appointment beyond four weeks from today.   _____________   LNLGX-21 Pre-Screening Questions:  . Do you currently have a fever? no (yes = cancel and refer to pcp for e-visit) . Have you recently travelled on a cruise, internationally, or to [place], [place], [place], [place], [place], or [place], [place] [employer]) ? no (yes = cancel, stay home, monitor symptoms, and contact pcp or initiate e-visit if symptoms develop) . Have you been in contact with someone that is currently pending confirmation of Covid19 testing or has been confirmed to have the [place] virus?  no (yes = cancel, stay home, away from tested individual, monitor symptoms, and contact pcp or initiate e-visit if symptoms develop) . Are you currently experiencing fatigue or cough? no (yes = pt should be prepared to have a mask placed at the time of their visit).

## 2018-07-21 ENCOUNTER — Telehealth (INDEPENDENT_AMBULATORY_CARE_PROVIDER_SITE_OTHER): Payer: PPO | Admitting: Cardiology

## 2018-07-21 ENCOUNTER — Encounter: Payer: Self-pay | Admitting: Cardiology

## 2018-07-21 ENCOUNTER — Other Ambulatory Visit: Payer: Self-pay

## 2018-07-21 VITALS — BP 138/79 | HR 61 | Ht 69.0 in | Wt 217.0 lb

## 2018-07-21 DIAGNOSIS — Z7901 Long term (current) use of anticoagulants: Secondary | ICD-10-CM

## 2018-07-21 DIAGNOSIS — G8929 Other chronic pain: Secondary | ICD-10-CM

## 2018-07-21 DIAGNOSIS — I351 Nonrheumatic aortic (valve) insufficiency: Secondary | ICD-10-CM

## 2018-07-21 DIAGNOSIS — I119 Hypertensive heart disease without heart failure: Secondary | ICD-10-CM

## 2018-07-21 DIAGNOSIS — E7800 Pure hypercholesterolemia, unspecified: Secondary | ICD-10-CM

## 2018-07-21 DIAGNOSIS — I480 Paroxysmal atrial fibrillation: Secondary | ICD-10-CM | POA: Diagnosis not present

## 2018-07-21 HISTORY — DX: Nonrheumatic aortic (valve) insufficiency: I35.1

## 2018-07-21 MED ORDER — EZETIMIBE 10 MG PO TABS: 10.0000 mg | ORAL_TABLET | Freq: Every day | ORAL | 2 refills | Status: DC

## 2018-07-21 MED ORDER — ELIQUIS 5 MG PO TABS: 5.0000 mg | ORAL_TABLET | Freq: Two times a day (BID) | ORAL | 2 refills | Status: DC

## 2018-07-21 NOTE — Progress Notes (Signed)
Virtual Visit via Video Note   This visit type was conducted due to national recommendations for restrictions regarding the COVID-19 Pandemic (e.g. social distancing) in an effort to limit this patient's exposure and mitigate transmission in our community.  Due to her co-morbid illnesses, this patient is at least at moderate risk for complications without adequate follow up.  This format is felt to be most appropriate for this patient at this time.  All issues noted in this document were discussed and addressed.  A limited physical exam was performed with this format.  Please refer to the patient's chart for her consent to telehealth for [facility].   Date:  07/21/2018   ID:  RAKEB KIBBLE, DOB 1945/09/08, MRN [id number]  Patient Location: Home Provider Location: Office  PCP:  Shawnee Knapp, MD  Cardiologist:  No primary care provider on file. Dr Bettina Gavia Electrophysiologist:  None   Evaluation Performed:  Follow-Up Visit  Chief Complaint:  6 month follow up of PAF, HTN, HLD. Chief complaint of leg cramping.   History of Present Illness:    Katherine Mcpherson is a 73 y.o. female with a hx of atrial fibrillation CHADs2 vasc score of 2 anticoagulated stage 3 CKD hypertension and hyperlipidemia  with apixaban last seen 01/30/18.  She reports compliance with all medications. Since her last visit she reports only one episode of palpitations(history of PAF) for which she took an extra half tablet of Metoprolol and the issue resolved. She routinely checks her blood pressure and reports only the one instance of a notification of 'irregular heartbeat'. She is anticoagulated on Eliquis and reports no bleeding events. Her blood pressure is generally in the 105-110/70-80 range with a few out of range 130-140/80-90 with a recent episode of back pain. We discussed that her Diclofenac may cause elevated BP which she takes as needed. She denies chest pain, SOB, PND, syncope.  Her main complaint is leg  cramping which occurs while she is sleeping and she wakes up to "shake it out". She uses mustard and feel it helps. We discussed decreasing her Zetia dose to a half tablet daily.  The patient does not have symptoms concerning for COVID-19 infection (fever, chills, cough, or new shortness of breath). She is exercising proper precaution.   Past Medical History:  Diagnosis Date  . Allergy   . Anemia    "comes and goes" (01/22/2013)  . Arthritis    "qwhere" (01/22/2013)  . Chronic lower back pain    "ongoing since back OR 2013" (01/22/2013)  . Fall from slip, trip, or stumble 01/11/2013   "stubbed toe at gas pump at [place]" (01/22/2013)  . GERD (gastroesophageal reflux disease)   . H/O hiatal hernia   . High cholesterol   . Hypertension   . Polyp of colon 10/2009  . PONV (postoperative nausea and vomiting)    Past Surgical History:  Procedure Laterality Date  . ABDOMINAL HYSTERECTOMY  1999   TAH/BSO  . ANKLE FRACTURE SURGERY Right 1985  . ANKLE HARDWARE REMOVAL Right 1986  . COLONOSCOPY  08/2007   TUBULAR ADENOMAWITH DYSPLASIA  . FOOT FRACTURE SURGERY Left 1986   "piece of bone removed" (08/03/2012)  . FRACTURE SURGERY Bilateral   . HAMMER TOE SURGERY Right 2002  . JOINT REPLACEMENT    . KNEE ARTHROSCOPY Left 2001  . POSTERIOR LUMBAR FUSION  2013   L 5-S1  . RADIOFREQUENCY ABLATION NERVES     6 week ago 07/31/14  . SPINE SURGERY  01/2012  . TOTAL KNEE ARTHROPLASTY Right 07/31/2012   Procedure: TOTAL KNEE ARTHROPLASTY;  Surgeon: Kerin Salen, MD;  Location: [place];  Service: Orthopedics;  Laterality: Right;  DEPUY/SIGMA  . TOTAL KNEE ARTHROPLASTY Left 01/22/2013  . TOTAL KNEE ARTHROPLASTY Left 01/22/2013   Procedure: LEFT TOTAL KNEE ARTHROPLASTY;  Surgeon: Kerin Salen, MD;  Location: [place];  Service: Orthopedics;  Laterality: Left;     No outpatient medications have been marked as taking for the 07/21/18 encounter (Appointment) with Richardo Priest, MD.     Allergies:    Sulfa antibiotics; Penicillins; and Latex   Social History   Tobacco Use  . Smoking status: Former Smoker    Packs/day: 0.50    Years: 8.00    Pack years: 4.00    Types: Cigarettes    Last attempt to quit: 03/15/1972    Years since quitting: 46.3  . Smokeless tobacco: Never Used  Substance Use Topics  . Alcohol use: Yes    Comment: occ/ rare wine-2 glasses of wine per month  . Drug use: No     Family Hx: The patient's family history includes Cancer in her paternal uncle; Heart disease in her father; Hyperlipidemia in her father; Hypertension in her father, mother, sister, and sister; Osteoporosis in her mother.  ROS:   Please see the history of present illness.    MS: Leg cramping bilateral. Worse at night. Denies weakness or pain in legs. All other systems reviewed and are negative.   Prior CV studies:   The following studies were reviewed today:  Echo January 2018 with EF 55%, mildly enlarged LA, mild to moderate AR, mild TR, grade 2 MR.  Labs/Other Tests and Data Reviewed:    EKG:  No ECG reviewed.  Recent Labs: 03/14/2018: ALT 35; BUN 26; Creatinine, Ser 1.14; Hemoglobin 12.8; Platelets 336; Potassium 4.6; Sodium 139 GFR 47 cc/min  Recent Lipid Panel Lab Results  Component Value Date/Time   CHOL 205 (H) 03/01/2018 11:06 AM   TRIG 126 03/01/2018 11:06 AM   HDL 59 03/01/2018 11:06 AM   CHOLHDL 3.5 03/01/2018 11:06 AM   CHOLHDL 3.0 08/25/2015 08:53 AM   LDLCALC 121 (H) 03/01/2018 11:06 AM    Wt Readings from Last 3 Encounters:  03/14/18 222 lb (100.7 kg)  01/30/18 220 lb 1.9 oz (99.8 kg)  11/29/16 217 lb (98.4 kg)     Objective:    Vital Signs:  There were no vitals taken for this visit.   VITAL SIGNS:  reviewed  Neuro: Alert and oriented x3.  CV: No JVD noted via video visit.   ASSESSMENT & PLAN:    1. PAF - 1 episode of PAF since last OV for which she took a half tablet of metoprolol and it resolved. Anticoagulated on Eliquis. Continue Metoprolol.  2. Long term anticoagulanet - Refill Eliquis today. No bleeding. Check CBC, CMP. 3. HLD - Change Zetia to one half tablet once daily due to leg cramping. Intolerant of statin therapy. December 2019 lipid panel: Total 205, HDL 59, LDL 121, Triglycerides 126. Recheck lipid panel. 4. Hypertensive heart disease without heart failure - BP well controlled at home, SBP 105-110. Continue current regimen of Lisinopril and Metoprolol. Check CMP. 5. Back pain - Recent episode of back pain for which NSAIDS were taken. She noted elevated SBP 130-140. Improved after a few takes back to normal SBP 105-110. Elevated BP due to NSAID use, she was educated on the potential effect and she will continue  to monitor. 6. Lifestyle - Encourage 30 minutes of moderate intensity exercise 3-4 times per week. Encourage heart healthy diet. Focus of grilled, baked foods rather than fried.   COVID-19 Education: The signs and symptoms of COVID-19 were discussed with the patient and how to seek care for testing (follow up with PCP or arrange E-visit).  The importance of social distancing was discussed today.  Time:   Today, I have spent 25 minutes with the patient with telehealth technology discussing the above problems.     Medication Adjustments/Labs and Tests Ordered: Current medicines are reviewed at length with the patient today.  Concerns regarding medicines are outlined above.   Tests Ordered: No orders of the defined types were placed in this encounter.   Medication Changes: No orders of the defined types were placed in this encounter.   Disposition:  Follow up in 6 month(s). CMP, CBC, Lipid profile in 1 month. Schedule repeat echocardiogram for monitoring of mild-moderate AS.    Signed, Shirlee More, MD  07/21/2018 8:19 AM    [place]

## 2018-07-21 NOTE — Patient Instructions (Addendum)
Medication Instructions:  Your physician recommends that you continue on your current medications as directed. Please refer to the Current Medication list given to you today.  If you need a refill on your cardiac medications before your next appointment, please call your pharmacy.   Lab work: Your physician recommends that you return for lab work in: 2month CMP,CBC,Lipids .PLease fast for these labs.  If you have labs (blood work) drawn today and your tests are completely normal, you will receive your results only by: Marland Kitchen MyChart Message (if you have MyChart) OR . A paper copy in the mail If you have any lab test that is abnormal or we need to change your treatment, we will call you to review the results.  Testing/Procedures: Your physician has requested that you have an echocardiogram. Echocardiography is a painless test that uses sound waves to create images of your heart. It provides your doctor with information about the size and shape of your heart and how well your heart's chambers and valves are working. This procedure takes approximately one hour. There are no restrictions for this procedure.  YOUR APPOINTMENT IS June 24  At 10:15  Follow-Up: At [facility], you and your health needs are our priority.  As part of our continuing mission to provide you with exceptional heart care, we have created designated Provider Care Teams.  These Care Teams include your primary Cardiologist (physician) and Advanced Practice Providers (APPs -  Physician Assistants and Nurse Practitioners) who all work together to provide you with the care you need, when you need it. You will need a follow up appointment in 1 years.  Please call our office 2 months in advance to schedule this appointment.  You may see No primary care provider on file. or another member of our [employer] Provider Team in [place]: Jenne Campus, MD . Jyl Heinz, MD  Any Other Special Instructions Will Be Listed Below (If  Applicable).   Echocardiogram An echocardiogram is a procedure that uses painless sound waves (ultrasound) to produce an image of the heart. Images from an echocardiogram can provide important information about:  Signs of coronary artery disease (CAD).  Aneurysm detection. An aneurysm is a weak or damaged part of an artery wall that bulges out from the normal force of blood pumping through the body.  Heart size and shape. Changes in the size or shape of the heart can be associated with certain conditions, including heart failure, aneurysm, and CAD.  Heart muscle function.  Heart valve function.  Signs of a past heart attack.  Fluid buildup around the heart.  Thickening of the heart muscle.  A tumor or infectious growth around the heart valves. Tell a health care provider about:  Any allergies you have.  All medicines you are taking, including vitamins, herbs, eye drops, creams, and over-the-counter medicines.  Any blood disorders you have.  Any surgeries you have had.  Any medical conditions you have.  Whether you are pregnant or may be pregnant. What are the risks? Generally, this is a safe procedure. However, problems may occur, including:  Allergic reaction to dye (contrast) that may be used during the procedure. What happens before the procedure? No specific preparation is needed. You may eat and drink normally. What happens during the procedure?   An IV tube may be inserted into one of your veins.  You may receive contrast through this tube. A contrast is an injection that improves the quality of the pictures from your heart.  A  gel will be applied to your chest.  A wand-like tool (transducer) will be moved over your chest. The gel will help to transmit the sound waves from the transducer.  The sound waves will harmlessly bounce off of your heart to allow the heart images to be captured in real-time motion. The images will be recorded on a computer. The  procedure may vary among health care providers and hospitals. What happens after the procedure?  You may return to your normal, everyday life, including diet, activities, and medicines, unless your health care provider tells you not to do that. Summary  An echocardiogram is a procedure that uses painless sound waves (ultrasound) to produce an image of the heart.  Images from an echocardiogram can provide important information about the size and shape of your heart, heart muscle function, heart valve function, and fluid buildup around your heart.  You do not need to do anything to prepare before this procedure. You may eat and drink normally.  After the echocardiogram is completed, you may return to your normal, everyday life, unless your health care provider tells you not to do that. This information is not intended to replace advice given to you by your health care provider. Make sure you discuss any questions you have with your health care provider. Document Released: 02/27/2000 Document Revised: 04/03/2016 Document Reviewed: 04/03/2016 Elsevier Interactive Patient Education  2019 [employer].

## 2018-07-24 ENCOUNTER — Other Ambulatory Visit: Payer: Self-pay | Admitting: Cardiology

## 2018-07-24 MED ORDER — EZETIMIBE 10 MG PO TABS: 10.0000 mg | ORAL_TABLET | Freq: Every day | ORAL | 2 refills | Status: DC

## 2018-07-24 MED ORDER — ELIQUIS 5 MG PO TABS: 5.0000 mg | ORAL_TABLET | Freq: Two times a day (BID) | ORAL | 2 refills | Status: DC

## 2018-07-24 NOTE — Telephone Encounter (Signed)
Rx refill sent to pharmacy. 

## 2018-07-24 NOTE — Telephone Encounter (Signed)
°  This needs to go to [facility], not [facility]!!!!   1. Which medications need to be refilled? (please list name of each medication and dose if known) Eliquis 5mg  tablets twice daily; Ezetimibe 10mg  tablet once daily  2. Which pharmacy/location (including street and city if local pharmacy) is medication to be sent to? [place]  3. Do they need a 30 day or 90 day supply? [place]

## 2018-08-31 DIAGNOSIS — H11153 Pinguecula, bilateral: Secondary | ICD-10-CM | POA: Diagnosis not present

## 2018-08-31 DIAGNOSIS — H25813 Combined forms of age-related cataract, bilateral: Secondary | ICD-10-CM | POA: Diagnosis not present

## 2018-09-01 ENCOUNTER — Other Ambulatory Visit: Payer: Self-pay | Admitting: *Deleted

## 2018-09-01 NOTE — Patient Outreach (Signed)
[place] [facility]) Care Management  09/01/2018  Katherine Mcpherson 02/26/1946 [id number]  RN Health Coach is closing this program. Consumer is enrolled in [place] [facility] external program.  [place] Care Management [phone]

## 2018-09-04 DIAGNOSIS — M7062 Trochanteric bursitis, left hip: Secondary | ICD-10-CM | POA: Diagnosis not present

## 2018-09-06 ENCOUNTER — Ambulatory Visit (HOSPITAL_BASED_OUTPATIENT_CLINIC_OR_DEPARTMENT_OTHER)
Admission: RE | Admit: 2018-09-06 | Discharge: 2018-09-06 | Disposition: A | Discharge status: Home / Self Care | Payer: PPO | Source: Ambulatory Visit | Attending: Cardiology | Admitting: Cardiology

## 2018-09-06 ENCOUNTER — Other Ambulatory Visit: Payer: Self-pay

## 2018-09-06 DIAGNOSIS — I351 Nonrheumatic aortic (valve) insufficiency: Secondary | ICD-10-CM | POA: Diagnosis not present

## 2018-09-06 DIAGNOSIS — E7800 Pure hypercholesterolemia, unspecified: Secondary | ICD-10-CM | POA: Diagnosis not present

## 2018-09-06 DIAGNOSIS — Z7901 Long term (current) use of anticoagulants: Secondary | ICD-10-CM | POA: Diagnosis not present

## 2018-09-06 DIAGNOSIS — I119 Hypertensive heart disease without heart failure: Secondary | ICD-10-CM | POA: Diagnosis not present

## 2018-09-06 DIAGNOSIS — I480 Paroxysmal atrial fibrillation: Secondary | ICD-10-CM | POA: Diagnosis not present

## 2018-09-06 NOTE — Progress Notes (Signed)
  Echocardiogram 2D Echocardiogram has been performed.  Cardell Peach 09/06/2018, 10:51 AM

## 2018-09-07 ENCOUNTER — Other Ambulatory Visit: Payer: Self-pay | Admitting: Physical Medicine and Rehabilitation

## 2018-09-07 DIAGNOSIS — M7061 Trochanteric bursitis, right hip: Secondary | ICD-10-CM

## 2018-09-07 DIAGNOSIS — M47816 Spondylosis without myelopathy or radiculopathy, lumbar region: Secondary | ICD-10-CM

## 2018-09-07 LAB — CBC
Hematocrit: 36 % (ref 34.0–46.6)
Hemoglobin: 11.9 g/dL (ref 11.1–15.9)
MCH: 29.2 pg (ref 26.6–33.0)
MCHC: 33.1 g/dL (ref 31.5–35.7)
MCV: 88 fL (ref 79–97)
Platelets: 343 10*3/uL (ref 150–450)
RBC: 4.08 x10E6/uL (ref 3.77–5.28)
RDW: 13.1 % (ref 11.7–15.4)
WBC: 12.2 10*3/uL — ABNORMAL HIGH (ref 3.4–10.8)

## 2018-09-07 LAB — COMPREHENSIVE METABOLIC PANEL
ALT: 16 IU/L (ref 0–32)
AST: 17 IU/L (ref 0–40)
Albumin/Globulin Ratio: 1.6 (ref 1.2–2.2)
Albumin: 3.9 g/dL (ref 3.7–4.7)
Alkaline Phosphatase: 64 IU/L (ref 39–117)
BUN/Creatinine Ratio: 33 — ABNORMAL HIGH (ref 12–28)
BUN: 41 mg/dL — ABNORMAL HIGH (ref 8–27)
Bilirubin Total: 0.3 mg/dL (ref 0.0–1.2)
CO2: 21 mmol/L (ref 20–29)
Calcium: 9.3 mg/dL (ref 8.7–10.3)
Chloride: 106 mmol/L (ref 96–106)
Creatinine, Ser: 1.23 mg/dL — ABNORMAL HIGH (ref 0.57–1.00)
GFR calc Af Amer: 50 mL/min/{1.73_m2} — ABNORMAL LOW (ref >59)
GFR calc non Af Amer: 43 mL/min/{1.73_m2} — ABNORMAL LOW (ref >59)
Globulin, Total: 2.5 g/dL (ref 1.5–4.5)
Glucose: 103 mg/dL — ABNORMAL HIGH (ref 65–99)
Potassium: 5.1 mmol/L (ref 3.5–5.2)
Sodium: 140 mmol/L (ref 134–144)
Total Protein: 6.4 g/dL (ref 6.0–8.5)

## 2018-09-07 LAB — LIPID PANEL
Chol/HDL Ratio: 3.1 ratio (ref 0.0–4.4)
Cholesterol, Total: 229 mg/dL — ABNORMAL HIGH (ref 100–199)
HDL: 73 mg/dL (ref >39)
LDL Calculated: 137 mg/dL — ABNORMAL HIGH (ref 0–99)
Triglycerides: 95 mg/dL (ref 0–149)
VLDL Cholesterol Cal: 19 mg/dL (ref 5–40)

## 2018-09-11 ENCOUNTER — Ambulatory Visit: Payer: PPO | Admitting: *Deleted

## 2018-10-03 ENCOUNTER — Other Ambulatory Visit: Payer: Self-pay

## 2018-10-03 ENCOUNTER — Ambulatory Visit
Admission: RE | Admit: 2018-10-03 | Discharge: 2018-10-03 | Disposition: A | Discharge status: Home / Self Care | Payer: PPO | Source: Ambulatory Visit | Attending: Physical Medicine and Rehabilitation | Admitting: Physical Medicine and Rehabilitation

## 2018-10-03 DIAGNOSIS — M5126 Other intervertebral disc displacement, lumbar region: Secondary | ICD-10-CM | POA: Diagnosis not present

## 2018-10-03 DIAGNOSIS — M7061 Trochanteric bursitis, right hip: Secondary | ICD-10-CM

## 2018-10-03 DIAGNOSIS — M4186 Other forms of scoliosis, lumbar region: Secondary | ICD-10-CM | POA: Diagnosis not present

## 2018-10-03 DIAGNOSIS — M47816 Spondylosis without myelopathy or radiculopathy, lumbar region: Secondary | ICD-10-CM

## 2018-10-03 DIAGNOSIS — M48061 Spinal stenosis, lumbar region without neurogenic claudication: Secondary | ICD-10-CM | POA: Diagnosis not present

## 2018-10-03 MED ORDER — GADOBENATE DIMEGLUMINE 529 MG/ML IV SOLN
10.0000 mL | Freq: Once | INTRAVENOUS | Status: AC | PRN
  Administered 2018-10-03: 10 mL via INTRAVENOUS

## 2018-10-09 DIAGNOSIS — M5416 Radiculopathy, lumbar region: Secondary | ICD-10-CM | POA: Diagnosis not present

## 2018-10-11 ENCOUNTER — Encounter: Payer: Self-pay | Admitting: Family Medicine

## 2018-10-18 DIAGNOSIS — M5416 Radiculopathy, lumbar region: Secondary | ICD-10-CM | POA: Diagnosis not present

## 2018-10-24 ENCOUNTER — Encounter: Payer: Self-pay | Admitting: Family Medicine

## 2018-10-24 ENCOUNTER — Ambulatory Visit (INDEPENDENT_AMBULATORY_CARE_PROVIDER_SITE_OTHER): Payer: PPO | Admitting: Family Medicine

## 2018-10-24 ENCOUNTER — Other Ambulatory Visit: Payer: Self-pay

## 2018-10-24 VITALS — BP 122/84 | HR 68 | Temp 98.6°F | Ht 69.0 in | Wt 226.2 lb

## 2018-10-24 DIAGNOSIS — N183 Chronic kidney disease, stage 3 unspecified: Secondary | ICD-10-CM

## 2018-10-24 DIAGNOSIS — Z7901 Long term (current) use of anticoagulants: Secondary | ICD-10-CM

## 2018-10-24 DIAGNOSIS — M545 Low back pain, unspecified: Secondary | ICD-10-CM

## 2018-10-24 DIAGNOSIS — I10 Essential (primary) hypertension: Secondary | ICD-10-CM

## 2018-10-24 DIAGNOSIS — G8929 Other chronic pain: Secondary | ICD-10-CM | POA: Diagnosis not present

## 2018-10-24 DIAGNOSIS — I480 Paroxysmal atrial fibrillation: Secondary | ICD-10-CM

## 2018-10-24 NOTE — Patient Instructions (Signed)
° ° ° °  If you have lab work done today you will be contacted with your lab results within the next 2 weeks.  If you have not heard from us then please contact us. The fastest way to get your results is to register for My Chart. ° ° °IF you received an x-ray today, you will receive an invoice from [facility]. Please contact [facility] at [phone] with questions or concerns regarding your invoice.  ° °IF you received labwork today, you will receive an invoice from [facility]. Please contact [facility] at [phone] with questions or concerns regarding your invoice.  ° °Our billing staff will not be able to assist you with questions regarding bills from these companies. ° °You will be contacted with the lab results as soon as they are available. The fastest way to get your results is to activate your My Chart account. Instructions are located on the last page of this paperwork. If you have not heard from us regarding the results in 2 weeks, please contact this office. °  ° ° ° °

## 2018-10-24 NOTE — Progress Notes (Signed)
8/11/20201:26 PM  Katherine Mcpherson [birth year], 73 y.o., female [id number]  Chief Complaint  Patient presents with  . Transitions Of Care    HPI:   Patient is a 73 y.o. female with past medical history significant for pafib on eliquis, HTN, GERD, HLP, CKD3, spondylosis with scoliosis who presents today for transfer of care  Previous PCP Dr Brigitte Pulse Last OV Feb 2020  Patient overall is doing well She has no acute concerns Her main issue is related to her chronic back pain that has worsened since close of gyms due to covid Brings in her mri for me to review - multilevel DDD, scoliosis, foraminal narrowing but no stenosis She has facet injections in Feb which have not provided significant pain relief  She will be scheduling her mammogram for later this year  Patient Care Team: Shawnee Knapp, MD as PCP - General (Family Medicine) Starling Manns, MD as Surgeon (Orthopedic Surgery) [name], MD as Consulting Physician (Orthopedic Surgery) [name], MD (Rehabilitation) [name], MD as Consulting Physician (Orthopedic Surgery) [name], MD as Consulting Physician (Gastroenterology) Bettina Gavia Hilton Cork, MD as Consulting Physician (Cardiology) [name], MD as Referring Physician (Orthopedic Surgery)  Lab Results  Component Value Date   CHOL 229 (H) 09/06/2018   HDL 73 09/06/2018   LDLCALC 137 (H) 09/06/2018   TRIG 95 09/06/2018   CHOLHDL 3.1 09/06/2018   Lab Results  Component Value Date   CREATININE 1.23 (H) 09/06/2018   BUN 41 (H) 09/06/2018   NA 140 09/06/2018   K 5.1 09/06/2018   CL 106 09/06/2018   CO2 21 09/06/2018    Depression screen PHQ 2/9 03/14/2018 11/29/2016 09/10/2016  Decreased Interest 0 0 0  Down, Depressed, Hopeless 0 0 0  PHQ - 2 Score 0 0 0    Fall Risk  10/24/2018 03/14/2018 11/29/2016 09/10/2016 04/06/2016  Falls in the past year? 1 0 No No Yes  Number falls in past yr: - 0 - - 1  Injury with Fall? 0 0 - - -  Comment - - - - -      Allergies  Allergen Reactions  . Sulfa Antibiotics Other (See Comments)    Makes tongue feel funny and makes her feel like she will pass  Out.  Last for days.  . Penicillins Itching    Childhood allergy.   . Latex Rash    Had rash on lft side only after back surgery    Prior to Admission medications   Medication Sig Start Date End Date Taking? Authorizing Provider  acetaminophen (TYLENOL) 650 MG CR tablet Take 650 mg by mouth 2 (two) times daily as needed for pain.   Yes [provider]  diclofenac (VOLTAREN) 25 MG EC tablet Take 25 mg by mouth 2 (two) times daily as needed.   Yes [provider]  ELIQUIS 5 MG TABS tablet Take 1 tablet (5 mg total) by mouth 2 (two) times daily. 07/24/18  Yes Richardo Priest, MD  famotidine (PEPCID) 20 MG tablet Take 20 mg by mouth daily as needed.    Yes [provider]  fluticasone (FLONASE ALLERGY RELIEF) 50 MCG/ACT nasal spray Place 1 spray into both nostrils daily as needed for allergies or rhinitis.   Yes [provider]  lisinopril (PRINIVIL,ZESTRIL) 5 MG tablet Take 0.5 tablets (2.5 mg total) by mouth daily. 06/14/18  Yes Richardo Priest, MD  loratadine (CLARITIN) 10 MG tablet Take 10 mg by mouth daily as  needed for allergies.   Yes [provider]  metoprolol tartrate (LOPRESSOR) 25 MG tablet Take 0.5 tablets (12.5 mg total) by mouth 2 (two) times daily. 07/04/18  Yes Richardo Priest, MD    Past Medical History:  Diagnosis Date  . Allergy   . Anemia    "comes and goes" (01/22/2013)  . Arthritis    "qwhere" (01/22/2013)  . Chronic lower back pain    "ongoing since back OR 2013" (01/22/2013)  . Fall from slip, trip, or stumble 01/11/2013   "stubbed toe at gas pump at [place]" (01/22/2013)  . GERD (gastroesophageal reflux disease)   . H/O hiatal hernia   . High cholesterol   . Hypertension   . Polyp of colon 10/2009  . PONV (postoperative nausea and vomiting)     Past Surgical History:  Procedure  Laterality Date  . ABDOMINAL HYSTERECTOMY  1999   TAH/BSO  . ANKLE FRACTURE SURGERY Right 1985  . ANKLE HARDWARE REMOVAL Right 1986  . COLONOSCOPY  08/2007   TUBULAR ADENOMAWITH DYSPLASIA  . FOOT FRACTURE SURGERY Left 1986   "piece of bone removed" (08/03/2012)  . FRACTURE SURGERY Bilateral   . HAMMER TOE SURGERY Right 2002  . JOINT REPLACEMENT    . KNEE ARTHROSCOPY Left 2001  . POSTERIOR LUMBAR FUSION  2013   L 5-S1  . RADIOFREQUENCY ABLATION NERVES     6 week ago 07/31/14  . SPINE SURGERY  01/2012  . TOTAL KNEE ARTHROPLASTY Right 07/31/2012   Procedure: TOTAL KNEE ARTHROPLASTY;  Surgeon: Kerin Salen, MD;  Location: [facility];  Service: Orthopedics;  Laterality: Right;  DEPUY/SIGMA  . TOTAL KNEE ARTHROPLASTY Left 01/22/2013  . TOTAL KNEE ARTHROPLASTY Left 01/22/2013   Procedure: LEFT TOTAL KNEE ARTHROPLASTY;  Surgeon: Kerin Salen, MD;  Location: [facility];  Service: Orthopedics;  Laterality: Left;    Social History   Tobacco Use  . Smoking status: Former Smoker    Packs/day: 0.50    Years: 8.00    Pack years: 4.00    Types: Cigarettes    Quit date: 03/15/1972    Years since quitting: 46.6  . Smokeless tobacco: Never Used  Substance Use Topics  . Alcohol use: Yes    Comment: occ/ rare wine-2 glasses of wine per month    Family History  Problem Relation Age of Onset  . Osteoporosis Mother   . Hypertension Mother   . Hypertension Sister   . Hypertension Sister   . Heart disease Father   . Hypertension Father   . Hyperlipidemia Father   . Cancer Paternal Uncle        PANCREATIC    Review of Systems  Constitutional: Negative for chills and fever.  Respiratory: Negative for cough and shortness of breath.   Cardiovascular: Negative for chest pain, palpitations and leg swelling.  Gastrointestinal: Negative for abdominal pain, nausea and vomiting.  Musculoskeletal: Positive for back pain and joint pain.   Per hpi  OBJECTIVE:  Today's Vitals   10/24/18 1321  BP:  122/84  Pulse: 68  Temp: 98.6 F (37 C)  TempSrc: Oral  SpO2: 96%  Weight: 226 lb 3.2 oz (102.6 kg)  Height: 5\' 9"  (1.753 m)   Body mass index is 33.4 kg/m.   Physical Exam Vitals signs and nursing note reviewed.  Constitutional:      Appearance: She is well-developed.  HENT:     Head: Normocephalic and atraumatic.     Mouth/Throat:     Pharynx: No  oropharyngeal exudate.  Eyes:     General: No scleral icterus.    Conjunctiva/sclera: Conjunctivae normal.     Pupils: Pupils are equal, round, and reactive to light.  Neck:     Musculoskeletal: Neck supple.  Cardiovascular:     Rate and Rhythm: Normal rate and regular rhythm.     Heart sounds: Normal heart sounds. No murmur. No friction rub. No gallop.   Pulmonary:     Effort: Pulmonary effort is normal.     Breath sounds: Normal breath sounds. No wheezing or rales.  Skin:    General: Skin is warm and dry.  Neurological:     Mental Status: She is alert and oriented to person, place, and time.     ASSESSMENT and PLAN  1. Essential hypertension Controlled. Continue current regime.   2. Stage 3 chronic kidney disease (HCC) Stable. Discussed importance of good BP control and avoidance of nephrotoxins.   3. Paroxysmal atrial fibrillation (HCC) On elquis and BB. Managed by cards.  4. Long term current use of anticoagulant therapy  5. Chronic bilateral low back pain without sciatica Stable. Continue with conservative measures.  Return in about 6 months (around 04/26/2019).    Rutherford Guys, MD Primary Care at [facility] [place], [place] [address] Ph.  [phone] Fax [phone]

## 2018-11-07 ENCOUNTER — Other Ambulatory Visit: Payer: Self-pay | Admitting: Family Medicine

## 2018-11-07 ENCOUNTER — Encounter: Payer: Self-pay | Admitting: Family Medicine

## 2018-11-07 DIAGNOSIS — Z1231 Encounter for screening mammogram for malignant neoplasm of breast: Secondary | ICD-10-CM

## 2018-11-08 ENCOUNTER — Ambulatory Visit (INDEPENDENT_AMBULATORY_CARE_PROVIDER_SITE_OTHER): Payer: PPO | Admitting: Family Medicine

## 2018-11-08 VITALS — BP 122/84 | Ht 69.0 in | Wt 226.0 lb

## 2018-11-08 DIAGNOSIS — Z0000 Encounter for general adult medical examination without abnormal findings: Secondary | ICD-10-CM

## 2018-11-08 NOTE — Progress Notes (Signed)
Presents today for [employer] Visit   Date of last exam:   Interpreter used for this visit?   no  Patient Care Team: Katherine Mcpherson, Katherine Mcpherson as PCP - General (Family Medicine) Katherine Mcpherson, Katherine Mcpherson as Surgeon (Orthopedic Surgery) Katherine Mcpherson, Katherine Mcpherson as Consulting Physician (Orthopedic Surgery) Katherine Mcpherson, Katherine Mcpherson (Rehabilitation) Katherine Mcpherson, Katherine Mcpherson as Consulting Physician (Orthopedic Surgery) Katherine Mcpherson, Katherine Mcpherson as Consulting Physician (Gastroenterology) Katherine Mcpherson, Katherine Mcpherson as Consulting Physician (Cardiology) Katherine Mcpherson, Katherine Mcpherson as Referring Physician (Orthopedic Surgery)   Other items to address today:   Discussed Eye/Dental Discussed immunizations   Other Screening:  Last lipid screening: 09/06/2018  ADVANCE DIRECTIVES: Discussed: {yes On File:yes Materials Provided: no  Immunization status:  Immunization History  Administered Date(s) Administered  . Influenza Split 11/30/2011, 12/02/2016  . Influenza, High Dose Seasonal PF 12/06/2017  . Influenza,inj,Quad PF,6+ Mos 12/03/2015  . Influenza-Unspecified 12/13/2012, 12/04/2013, 12/24/2014  . Pneumococcal Conjugate-13 01/22/2014  . Pneumococcal Polysaccharide-23 01/14/2015  . Tdap 12/30/2011  . Zoster 03/13/2010     Health Maintenance Due  Topic Date Due  . MAMMOGRAM  03/23/2018     Functional Status Survey: Is the patient deaf or have difficulty hearing?: No Does the patient have difficulty seeing, even when wearing glasses/contacts?: No Does the patient have difficulty concentrating, remembering, or making decisions?: No Does the patient have difficulty walking or climbing stairs?: No Does the patient have difficulty dressing or bathing?: No Does the patient have difficulty doing errands alone such as visiting a doctor's office or shopping?: No   6CIT Screen 11/08/2018  What Year? 0 points  What month? 0 points  What time? 0 points  Count back from 20 0 points  Months in reverse 0 points  Repeat  phrase 0 points  Total Score 0        Clinical Support from 11/08/2018 in [facility] at [facility]  AUDIT-C Score  1       Home Environment:   Live one story home  No trouble climbing stairs One scattered rug with grabbers Towel bar in uses sink states very small Adequate lighting/clutter       Patient Active Problem List   Diagnosis Date Noted  . Aortic regurgitation 07/21/2018  . Long term current use of anticoagulant therapy 09/10/2016  . Trochanteric bursitis of left hip 09/10/2016  . Paroxysmal atrial fibrillation ([facility]) 09/10/2016  . Pure hypercholesterolemia 09/10/2016  . Gastroesophageal reflux disease 08/30/2016  . Hypertensive heart disease 12/30/2011  . Obesity, Class I, BMI 30-34.9 12/30/2011  . Vaginal atrophy 12/30/2011  . Menopausal state 12/30/2011  . Low back pain 12/15/2011     Past Medical History:  Diagnosis Date  . Allergy   . Anemia    "comes and goes" (01/22/2013)  . Arthritis    "qwhere" (01/22/2013)  . Chronic lower back pain    "ongoing since back OR 2013" (01/22/2013)  . Fall from slip, trip, or stumble 01/11/2013   "stubbed toe at gas pump at [place]" (01/22/2013)  . GERD (gastroesophageal reflux disease)   . H/O hiatal hernia   . High cholesterol   . Hypertension   . Polyp of colon 10/2009  . PONV (postoperative nausea and vomiting)      Past Surgical History:  Procedure Laterality Date  . ABDOMINAL HYSTERECTOMY  1999   TAH/BSO  . ANKLE FRACTURE SURGERY Right 1985  . ANKLE HARDWARE REMOVAL Right 1986  . COLONOSCOPY  08/2007   TUBULAR ADENOMAWITH DYSPLASIA  .  FOOT FRACTURE SURGERY Left 1986   "piece of bone removed" (08/03/2012)  . FRACTURE SURGERY Bilateral   . HAMMER TOE SURGERY Right 2002  . JOINT REPLACEMENT    . KNEE ARTHROSCOPY Left 2001  . POSTERIOR LUMBAR FUSION  2013   L 5-S1  . RADIOFREQUENCY ABLATION NERVES     6 week ago 07/31/14  . SPINE SURGERY  01/2012  . TOTAL KNEE ARTHROPLASTY Right 07/31/2012    Procedure: TOTAL KNEE ARTHROPLASTY;  Surgeon: Katherine Mcpherson, Katherine Mcpherson;  Location: [facility];  Service: Orthopedics;  Laterality: Right;  DEPUY/SIGMA  . TOTAL KNEE ARTHROPLASTY Left 01/22/2013  . TOTAL KNEE ARTHROPLASTY Left 01/22/2013   Procedure: LEFT TOTAL KNEE ARTHROPLASTY;  Surgeon: Katherine Mcpherson, Katherine Mcpherson;  Location: [facility];  Service: Orthopedics;  Laterality: Left;     Family History  Problem Relation Age of Onset  . Osteoporosis Mother   . Hypertension Mother   . Hypertension Sister   . Hypertension Sister   . Heart disease Father   . Hypertension Father   . Hyperlipidemia Father   . Cancer Paternal Uncle        PANCREATIC     Social History   Socioeconomic History  . Marital status: Single    Spouse name: Not on file  . Number of children: Not on file  . Years of education: Not on file  . Highest education level: Not on file  Occupational History  . Occupation: retired  Scientific laboratory technician  . Financial resource strain: Not on file  . Food insecurity    Worry: Not on file    Inability: Not on file  . Transportation needs    Medical: Not on file    Non-medical: Not on file  Tobacco Use  . Smoking status: Former Smoker    Packs/day: 0.50    Years: 8.00    Pack years: 4.00    Types: Cigarettes    Quit date: 03/15/1972    Years since quitting: 46.6  . Smokeless tobacco: Never Used  Substance and Sexual Activity  . Alcohol use: Yes    Comment: occ/ rare wine-2 glasses of wine per month  . Drug use: No  . Sexual activity: Never  Lifestyle  . Physical activity    Days per week: Not on file    Minutes per session: Not on file  . Stress: Not on file  Relationships  . Social Herbalist on phone: Not on file    Gets together: Not on file    Attends religious service: Not on file    Active member of club or organization: Not on file    Attends meetings of clubs or organizations: Not on file    Relationship status: Not on file  . Intimate partner violence    Fear of  current or ex partner: Not on file    Emotionally abused: Not on file    Physically abused: Not on file    Forced sexual activity: Not on file  Other Topics Concern  . Not on file  Social History Narrative   Exercise riding bike at [employer] and walking     Allergies  Allergen Reactions  . Sulfa Antibiotics Other (See Comments)    Makes tongue feel funny and makes her feel like she will pass  Out.  Last for days.  . Penicillins Itching    Childhood allergy.   . Latex Rash    Had rash on lft side only after back  surgery     Prior to Admission medications   Medication Sig Start Date End Date Taking? Authorizing Provider  acetaminophen (TYLENOL) 650 MG CR tablet Take 650 mg by mouth 2 (two) times daily as needed for pain.   Yes Provider, Historical, Katherine Mcpherson  ELIQUIS 5 MG TABS tablet Take 1 tablet (5 mg total) by mouth 2 (two) times daily. 07/24/18  Yes Katherine Mcpherson, Katherine Mcpherson  famotidine (PEPCID) 20 MG tablet Take 20 mg by mouth daily as needed.    Yes Provider, Historical, Katherine Mcpherson  lisinopril (PRINIVIL,ZESTRIL) 5 MG tablet Take 0.5 tablets (2.5 mg total) by mouth daily. 06/14/18  Yes Katherine Mcpherson, Katherine Mcpherson  loratadine (CLARITIN) 10 MG tablet Take 10 mg by mouth daily as needed for allergies.   Yes Provider, Historical, Katherine Mcpherson  metoprolol tartrate (LOPRESSOR) 25 MG tablet Take 0.5 tablets (12.5 mg total) by mouth 2 (two) times daily. 07/04/18  Yes Katherine Mcpherson, Katherine Mcpherson  diclofenac (VOLTAREN) 25 MG EC tablet Take 25 mg by mouth 2 (two) times daily as needed.    Provider, Historical, Katherine Mcpherson  fluticasone (FLONASE ALLERGY RELIEF) 50 MCG/ACT nasal spray Place 1 spray into both nostrils daily as needed for allergies or rhinitis.    Provider, Historical, Katherine Mcpherson     Depression screen [facility] 2/9 11/08/2018 03/14/2018 11/29/2016 09/10/2016 04/06/2016  Decreased Interest 0 0 0 0 0  Down, Depressed, Hopeless 0 0 0 0 0  PHQ - 2 Score 0 0 0 0 0     Fall Risk  11/08/2018 10/24/2018 03/14/2018 11/29/2016 09/10/2016  Falls in the  past year? 1 1 0 No No  Number falls in past yr: 0 - 0 - -  Injury with Fall? 0 0 0 - -  Comment - - - - -  Follow up Falls evaluation completed;Education provided;Falls prevention discussed - - - -      PHYSICAL EXAM: BP 122/84 Comment: per patient  Ht 5\' 9"  (1.753 m)   Wt 226 lb (102.5 kg) Comment: per patient  BMI 33.37 kg/m    Wt Readings from Last 3 Encounters:  11/08/18 226 lb (102.5 kg)  10/24/18 226 lb 3.2 oz (102.6 kg)  07/21/18 217 lb (98.4 kg)     Medicare annual wellness visit, subsequent    Physical Exam   Education/Counseling provided regarding diet and exercise, prevention of chronic diseases, smoking/tobacco cessation, if applicable, and reviewed "Covered Medicare Preventive Services."

## 2018-11-08 NOTE — Patient Instructions (Signed)
Thank you for taking time to come for your Medicare Wellness Visit. I appreciate your ongoing commitment to your health goals. Please review the following plan we discussed and let me know if I can assist you in the future.    LPN   Preventive Care 75 Years and Older, Female Preventive care refers to lifestyle choices and visits with your health care provider that can promote health and wellness. This includes:  A yearly physical exam. This is also called an annual well check.  Regular dental and eye exams.  Immunizations.  Screening for certain conditions.  Healthy lifestyle choices, such as diet and exercise. What can I expect for my preventive care visit? Physical exam Your health care provider will check:  Height and weight. These may be used to calculate body mass index (BMI), which is a measurement that tells if you are at a healthy weight.  Heart rate and blood pressure.  Your skin for abnormal spots. Counseling Your health care provider may ask you questions about:  Alcohol, tobacco, and drug use.  Emotional well-being.  Home and relationship well-being.  Sexual activity.  Eating habits.  History of falls.  Memory and ability to understand (cognition).  Work and work environment.  Pregnancy and menstrual history. What immunizations do I need?  Influenza (flu) vaccine  This is recommended every year. Tetanus, diphtheria, and pertussis (Tdap) vaccine  You may need a Td booster every 10 years. Varicella (chickenpox) vaccine  You may need this vaccine if you have not already been vaccinated. Zoster (shingles) vaccine  You may need this after age 60. Pneumococcal conjugate (PCV13) vaccine  One dose is recommended after age 65. Pneumococcal polysaccharide (PPSV23) vaccine  One dose is recommended after age 65. Measles, mumps, and rubella (MMR) vaccine  You may need at least one dose of MMR if you were born in 1957 or later. You may also  need a second dose. Meningococcal conjugate (MenACWY) vaccine  You may need this if you have certain conditions. Hepatitis A vaccine  You may need this if you have certain conditions or if you travel or work in places where you may be exposed to hepatitis A. Hepatitis B vaccine  You may need this if you have certain conditions or if you travel or work in places where you may be exposed to hepatitis B. Haemophilus influenzae type b (Hib) vaccine  You may need this if you have certain conditions. You may receive vaccines as individual doses or as more than one vaccine together in one shot (combination vaccines). Talk with your health care provider about the risks and benefits of combination vaccines. What tests do I need? Blood tests  Lipid and cholesterol levels. These may be checked every 5 years, or more frequently depending on your overall health.  Hepatitis C test.  Hepatitis B test. Screening  Lung cancer screening. You may have this screening every year starting at age 55 if you have a 30-pack-year history of smoking and currently smoke or have quit within the past 15 years.  Colorectal cancer screening. All adults should have this screening starting at age 50 and continuing until age 75. Your health care provider may recommend screening at age 45 if you are at increased risk. You will have tests every 1-10 years, depending on your results and the type of screening test.  Diabetes screening. This is done by checking your blood sugar (glucose) after you have not eaten for a while (fasting). You may have this done every   1-3 years.  Mammogram. This may be done every 1-2 years. Talk with your health care provider about how often you should have regular mammograms.  BRCA-related cancer screening. This may be done if you have a family history of breast, ovarian, tubal, or peritoneal cancers. Other tests  Sexually transmitted disease (STD) testing.  Bone density scan. This is done  to screen for osteoporosis. You may have this done starting at age 65. Follow these instructions at home: Eating and drinking  Eat a diet that includes fresh fruits and vegetables, whole grains, lean protein, and low-fat dairy products. Limit your intake of foods with high amounts of sugar, saturated fats, and salt.  Take vitamin and mineral supplements as recommended by your health care provider.  Do not drink alcohol if your health care provider tells you not to drink.  If you drink alcohol: ? Limit how much you have to 0-1 drink a day. ? Be aware of how much alcohol is in your drink. In the [place] one drink equals one 12 oz bottle of beer (355 mL), one 5 oz glass of wine (148 mL), or one 1 oz glass of hard liquor (44 mL). Lifestyle  Take daily care of your teeth and gums.  Stay active. Exercise for at least 30 minutes on 5 or more days each week.  Do not use any products that contain nicotine or tobacco, such as cigarettes, e-cigarettes, and chewing tobacco. If you need help quitting, ask your health care provider.  If you are sexually active, practice safe sex. Use a condom or other form of protection in order to prevent STIs (sexually transmitted infections).  Talk with your health care provider about taking a low-dose aspirin or statin. What's next?  Go to your health care provider once a year for a well check visit.  Ask your health care provider how often you should have your eyes and teeth checked.  Stay up to date on all vaccines. This information is not intended to replace advice given to you by your health care provider. Make sure you discuss any questions you have with your health care provider. Document Released: 03/28/2015 Document Revised: 02/23/2018 Document Reviewed: 02/23/2018 Elsevier Patient Education  2020 [employer]  

## 2018-11-14 NOTE — Progress Notes (Signed)
Presents today for [employer] Visit   Date of last exam:   Interpreter used for this visit?   No  I connected with  Katherine Mcpherson on 11/14/18 by a telephone application and verified that I am speaking with the correct person using two identifiers.     Patient Care Team: Rutherford Guys, MD as PCP - General (Family Medicine) Starling Manns, MD as Surgeon (Orthopedic Surgery) [name], MD as Consulting Physician (Orthopedic Surgery) [name], MD (Rehabilitation) [name], MD as Consulting Physician (Orthopedic Surgery) [name], MD as Consulting Physician (Gastroenterology) Richardo Priest, MD as Consulting Physician (Cardiology) [name], MD as Referring Physician (Orthopedic Surgery)   Other items to address today:   Discussed Eye/Dental Discussed immunizations   Other Screening:  Last lipid screening: 09/06/2018  ADVANCE DIRECTIVES: Discussed: {yes On File:yes Materials Provided: no  Immunization status:  Immunization History  Administered Date(s) Administered  . Influenza Split 11/30/2011, 12/02/2016  . Influenza, High Dose Seasonal PF 12/06/2017  . Influenza,inj,Quad PF,6+ Mos 12/03/2015  . Influenza-Unspecified 12/13/2012, 12/04/2013, 12/24/2014  . Pneumococcal Conjugate-13 01/22/2014  . Pneumococcal Polysaccharide-23 01/14/2015  . Tdap 12/30/2011  . Zoster 03/13/2010     Health Maintenance Due  Topic Date Due  . MAMMOGRAM  03/23/2018     Functional Status Survey: Is the patient deaf or have difficulty hearing?: No Does the patient have difficulty seeing, even when wearing glasses/contacts?: No Does the patient have difficulty concentrating, remembering, or making decisions?: No Does the patient have difficulty walking or climbing stairs?: No Does the patient have difficulty dressing or bathing?: No Does the patient have difficulty doing errands alone such as visiting a doctor's office or shopping?: No   6CIT Screen 11/08/2018  What Year? 0 points  What month? 0 points  What time? 0 points  Count back from 20 0 points  Months in reverse 0 points  Repeat phrase 0 points  Total Score 0        Office Visit from 11/08/2018 in [facility]  AUDIT-C Score  1       Home Environment:   Live one story home  No trouble climbing stairs One scattered rug with grabbers Towel bar in uses sink states very small Adequate lighting/clutter       Patient Active Problem List   Diagnosis Date Noted  . Aortic regurgitation 07/21/2018  . Long term current use of anticoagulant therapy 09/10/2016  . Trochanteric bursitis of left hip 09/10/2016  . Paroxysmal atrial fibrillation ([place]) 09/10/2016  . Pure hypercholesterolemia 09/10/2016  . Gastroesophageal reflux disease 08/30/2016  . Hypertensive heart disease 12/30/2011  . Obesity, Class I, BMI 30-34.9 12/30/2011  . Vaginal atrophy 12/30/2011  . Menopausal state 12/30/2011  . Low back pain 12/15/2011     Past Medical History:  Diagnosis Date  . Allergy   . Anemia    "comes and goes" (01/22/2013)  . Arthritis    "qwhere" (01/22/2013)  . Chronic lower back pain    "ongoing since back OR 2013" (01/22/2013)  . Fall from slip, trip, or stumble 01/11/2013   "stubbed toe at gas pump at [place]" (01/22/2013)  . GERD (gastroesophageal reflux disease)   . H/O hiatal hernia   . High cholesterol   . Hypertension   . Polyp of colon 10/2009  . PONV (postoperative nausea and vomiting)      Past Surgical History:  Procedure Laterality Date  . ABDOMINAL HYSTERECTOMY  1999  TAH/BSO  . ANKLE FRACTURE SURGERY Right 1985  . ANKLE HARDWARE REMOVAL Right 1986  . COLONOSCOPY  08/2007   TUBULAR ADENOMAWITH DYSPLASIA  . FOOT FRACTURE SURGERY Left 1986   "piece of bone removed" (08/03/2012)  . FRACTURE SURGERY Bilateral   . HAMMER TOE SURGERY Right 2002  . JOINT REPLACEMENT    . KNEE ARTHROSCOPY Left 2001  . POSTERIOR LUMBAR FUSION   2013   L 5-S1  . RADIOFREQUENCY ABLATION NERVES     6 week ago 07/31/14  . SPINE SURGERY  01/2012  . TOTAL KNEE ARTHROPLASTY Right 07/31/2012   Procedure: TOTAL KNEE ARTHROPLASTY;  Surgeon: Kerin Salen, MD;  Location: [place];  Service: Orthopedics;  Laterality: Right;  DEPUY/SIGMA  . TOTAL KNEE ARTHROPLASTY Left 01/22/2013  . TOTAL KNEE ARTHROPLASTY Left 01/22/2013   Procedure: LEFT TOTAL KNEE ARTHROPLASTY;  Surgeon: Kerin Salen, MD;  Location: [facility];  Service: Orthopedics;  Laterality: Left;     Family History  Problem Relation Age of Onset  . Osteoporosis Mother   . Hypertension Mother   . Hypertension Sister   . Hypertension Sister   . Heart disease Father   . Hypertension Father   . Hyperlipidemia Father   . Cancer Paternal Uncle        PANCREATIC     Social History   Socioeconomic History  . Marital status: Single    Spouse name: Not on file  . Number of children: Not on file  . Years of education: Not on file  . Highest education level: Not on file  Occupational History  . Occupation: retired  Scientific laboratory technician  . Financial resource strain: Not on file  . Food insecurity    Worry: Not on file    Inability: Not on file  . Transportation needs    Medical: Not on file    Non-medical: Not on file  Tobacco Use  . Smoking status: Former Smoker    Packs/day: 0.50    Years: 8.00    Pack years: 4.00    Types: Cigarettes    Quit date: 03/15/1972    Years since quitting: 46.6  . Smokeless tobacco: Never Used  Substance and Sexual Activity  . Alcohol use: Yes    Comment: occ/ rare wine-2 glasses of wine per month  . Drug use: No  . Sexual activity: Never  Lifestyle  . Physical activity    Days per week: Not on file    Minutes per session: Not on file  . Stress: Not on file  Relationships  . Social Herbalist on phone: Not on file    Gets together: Not on file    Attends religious service: Not on file    Active member of club or organization: Not on  file    Attends meetings of clubs or organizations: Not on file    Relationship status: Not on file  . Intimate partner violence    Fear of current or ex partner: Not on file    Emotionally abused: Not on file    Physically abused: Not on file    Forced sexual activity: Not on file  Other Topics Concern  . Not on file  Social History Narrative   Exercise riding bike at [employer] and walking     Allergies  Allergen Reactions  . Sulfa Antibiotics Other (See Comments)    Makes tongue feel funny and makes her feel like she will pass  Out.  Last  for days.  . Penicillins Itching    Childhood allergy.   . Latex Rash    Had rash on lft side only after back surgery     Prior to Admission medications   Medication Sig Start Date End Date Taking? Authorizing Provider  acetaminophen (TYLENOL) 650 MG CR tablet Take 650 mg by mouth 2 (two) times daily as needed for pain.   Yes [provider]  ELIQUIS 5 MG TABS tablet Take 1 tablet (5 mg total) by mouth 2 (two) times daily. 07/24/18  Yes Richardo Priest, MD  famotidine (PEPCID) 20 MG tablet Take 20 mg by mouth daily as needed.    Yes [provider]  lisinopril (PRINIVIL,ZESTRIL) 5 MG tablet Take 0.5 tablets (2.5 mg total) by mouth daily. 06/14/18  Yes Richardo Priest, MD  loratadine (CLARITIN) 10 MG tablet Take 10 mg by mouth daily as needed for allergies.   Yes [provider]  metoprolol tartrate (LOPRESSOR) 25 MG tablet Take 0.5 tablets (12.5 mg total) by mouth 2 (two) times daily. 07/04/18  Yes Richardo Priest, MD  diclofenac (VOLTAREN) 25 MG EC tablet Take 25 mg by mouth 2 (two) times daily as needed.    [provider]  fluticasone (FLONASE ALLERGY RELIEF) 50 MCG/ACT nasal spray Place 1 spray into both nostrils daily as needed for allergies or rhinitis.    [provider]     Depression screen [facility] 2/9 11/08/2018 03/14/2018 11/29/2016 09/10/2016 04/06/2016  Decreased Interest 0 0 0 0 0  Down,  Depressed, Hopeless 0 0 0 0 0  PHQ - 2 Score 0 0 0 0 0     Fall Risk  11/08/2018 10/24/2018 03/14/2018 11/29/2016 09/10/2016  Falls in the past year? 1 1 0 No No  Number falls in past yr: 0 - 0 - -  Injury with Fall? 0 0 0 - -  Comment - - - - -  Follow up Falls evaluation completed;Education provided;Falls prevention discussed - - - -      PHYSICAL EXAM: BP 122/84 Comment: per patient  Ht 5\' 9"  (1.753 m)   Wt 226 lb (102.5 kg) Comment: per patient  BMI 33.37 kg/m    Wt Readings from Last 3 Encounters:  11/08/18 226 lb (102.5 kg)  10/24/18 226 lb 3.2 oz (102.6 kg)  07/21/18 217 lb (98.4 kg)     Medicare annual wellness visit, subsequent    Physical Exam   Education/Counseling provided regarding diet and exercise, prevention of chronic diseases, smoking/tobacco cessation, if applicable, and reviewed "Covered Medicare Preventive Services."

## 2018-11-17 ENCOUNTER — Ambulatory Visit
Admission: RE | Admit: 2018-11-17 | Discharge: 2018-11-17 | Disposition: A | Discharge status: Home / Self Care | Payer: PPO | Source: Ambulatory Visit | Attending: Family Medicine | Admitting: Family Medicine

## 2018-11-17 ENCOUNTER — Other Ambulatory Visit: Payer: Self-pay

## 2018-11-17 DIAGNOSIS — Z1231 Encounter for screening mammogram for malignant neoplasm of breast: Secondary | ICD-10-CM | POA: Diagnosis not present

## 2018-12-13 ENCOUNTER — Other Ambulatory Visit: Payer: Self-pay | Admitting: Cardiology

## 2018-12-13 DIAGNOSIS — I10 Essential (primary) hypertension: Secondary | ICD-10-CM

## 2018-12-13 MED ORDER — LISINOPRIL 5 MG PO TABS: 2.5000 mg | ORAL_TABLET | Freq: Every day | ORAL | 1 refills | Status: DC

## 2018-12-13 NOTE — Telephone Encounter (Signed)
°*  STAT* If patient is at the pharmacy, call can be transferred to refill team.   1. Which medications need to be refilled? (please list name of each medication and dose if known) lisinopril (PRINIVIL,ZESTRIL) 5 MG   2. Which pharmacy/location (including street and city if local pharmacy) is medication to be sent to?  [facility] - [place], [place] [phone] (Phone) [phone] (Fax)    3. Do they need a 30 day or 90 day supply? 90 day

## 2018-12-13 NOTE — Telephone Encounter (Signed)
Refill for lisinopril sent to [facility] as requested.

## 2018-12-18 DIAGNOSIS — M7062 Trochanteric bursitis, left hip: Secondary | ICD-10-CM | POA: Diagnosis not present

## 2018-12-18 DIAGNOSIS — M5416 Radiculopathy, lumbar region: Secondary | ICD-10-CM | POA: Diagnosis not present

## 2019-01-02 ENCOUNTER — Telehealth: Payer: Self-pay | Admitting: *Deleted

## 2019-01-02 MED ORDER — METOPROLOL TARTRATE 25 MG PO TABS: 12.5000 mg | ORAL_TABLET | Freq: Two times a day (BID) | ORAL | 1 refills | Status: DC

## 2019-01-02 NOTE — Telephone Encounter (Signed)
Rx refill sent to pharmacy. 

## 2019-01-10 DIAGNOSIS — M5416 Radiculopathy, lumbar region: Secondary | ICD-10-CM | POA: Diagnosis not present

## 2019-03-01 DIAGNOSIS — M24811 Other specific joint derangements of right shoulder, not elsewhere classified: Secondary | ICD-10-CM | POA: Diagnosis not present

## 2019-03-01 DIAGNOSIS — M67911 Unspecified disorder of synovium and tendon, right shoulder: Secondary | ICD-10-CM | POA: Diagnosis not present

## 2019-03-13 DIAGNOSIS — M75100 Unspecified rotator cuff tear or rupture of unspecified shoulder, not specified as traumatic: Secondary | ICD-10-CM | POA: Insufficient documentation

## 2019-03-13 DIAGNOSIS — M25611 Stiffness of right shoulder, not elsewhere classified: Secondary | ICD-10-CM | POA: Diagnosis not present

## 2019-03-13 DIAGNOSIS — S46011D Strain of muscle(s) and tendon(s) of the rotator cuff of right shoulder, subsequent encounter: Secondary | ICD-10-CM | POA: Diagnosis not present

## 2019-03-13 DIAGNOSIS — M25511 Pain in right shoulder: Secondary | ICD-10-CM | POA: Diagnosis not present

## 2019-03-13 HISTORY — DX: Unspecified rotator cuff tear or rupture of unspecified shoulder, not specified as traumatic: M75.100

## 2019-03-15 DIAGNOSIS — M25511 Pain in right shoulder: Secondary | ICD-10-CM | POA: Diagnosis not present

## 2019-03-15 DIAGNOSIS — S46011D Strain of muscle(s) and tendon(s) of the rotator cuff of right shoulder, subsequent encounter: Secondary | ICD-10-CM | POA: Diagnosis not present

## 2019-03-15 DIAGNOSIS — M25611 Stiffness of right shoulder, not elsewhere classified: Secondary | ICD-10-CM | POA: Diagnosis not present

## 2019-03-19 DIAGNOSIS — S46011D Strain of muscle(s) and tendon(s) of the rotator cuff of right shoulder, subsequent encounter: Secondary | ICD-10-CM | POA: Diagnosis not present

## 2019-03-19 DIAGNOSIS — M25611 Stiffness of right shoulder, not elsewhere classified: Secondary | ICD-10-CM | POA: Diagnosis not present

## 2019-03-19 DIAGNOSIS — M25511 Pain in right shoulder: Secondary | ICD-10-CM | POA: Diagnosis not present

## 2019-03-21 DIAGNOSIS — S46011D Strain of muscle(s) and tendon(s) of the rotator cuff of right shoulder, subsequent encounter: Secondary | ICD-10-CM | POA: Diagnosis not present

## 2019-03-21 DIAGNOSIS — M25511 Pain in right shoulder: Secondary | ICD-10-CM | POA: Diagnosis not present

## 2019-03-21 DIAGNOSIS — M25611 Stiffness of right shoulder, not elsewhere classified: Secondary | ICD-10-CM | POA: Diagnosis not present

## 2019-03-27 DIAGNOSIS — M25611 Stiffness of right shoulder, not elsewhere classified: Secondary | ICD-10-CM | POA: Diagnosis not present

## 2019-03-27 DIAGNOSIS — S46011D Strain of muscle(s) and tendon(s) of the rotator cuff of right shoulder, subsequent encounter: Secondary | ICD-10-CM | POA: Diagnosis not present

## 2019-03-27 DIAGNOSIS — M25511 Pain in right shoulder: Secondary | ICD-10-CM | POA: Diagnosis not present

## 2019-03-28 ENCOUNTER — Ambulatory Visit: Payer: Medicare Other | Attending: Internal Medicine

## 2019-03-28 DIAGNOSIS — Z23 Encounter for immunization: Secondary | ICD-10-CM | POA: Insufficient documentation

## 2019-03-28 NOTE — Progress Notes (Signed)
   Covid-19 Vaccination Clinic  Name:  Katherine Mcpherson    MRN: [id number] DOB: [birth year]  03/28/2019  [name] was observed post Covid-19 immunization for 30 minutes based on pre-vaccination screening without incidence. She was provided with Vaccine Information Sheet and instruction to access the V-Safe system.   [name] was instructed to call 911 with any severe reactions post vaccine: Marland Kitchen Difficulty breathing  . Swelling of your face and throat  . A fast heartbeat  . A bad rash all over your body  . Dizziness and weakness    Immunizations Administered    Name Date Dose VIS Date Route   Pfizer COVID-19 Vaccine 03/28/2019  9:24 AM 0.3 mL 02/23/2019 Intramuscular   Manufacturer: [place]   Lot: [id number]   [place]: [id number]

## 2019-04-02 ENCOUNTER — Encounter: Payer: Self-pay | Admitting: Family Medicine

## 2019-04-17 ENCOUNTER — Ambulatory Visit: Payer: PPO | Attending: Internal Medicine

## 2019-04-17 DIAGNOSIS — Z23 Encounter for immunization: Secondary | ICD-10-CM | POA: Insufficient documentation

## 2019-04-17 NOTE — Progress Notes (Signed)
   Covid-19 Vaccination Clinic  Name:  Katherine Mcpherson    MRN: [id number] DOB: 12-13-1945  04/17/2019  [name] was observed post Covid-19 immunization for 15 minutes without incidence. She was provided with Vaccine Information Sheet and instruction to access the V-Safe system.   [name] was instructed to call 911 with any severe reactions post vaccine: Marland Kitchen Difficulty breathing  . Swelling of your face and throat  . A fast heartbeat  . A bad rash all over your body  . Dizziness and weakness    Immunizations Administered    Name Date Dose VIS Date Route   Pfizer COVID-19 Vaccine 04/17/2019  8:23 AM 0.3 mL 02/23/2019 Intramuscular   Manufacturer: [place]   Lot: [id number]   [place]: [id number]

## 2019-04-23 DIAGNOSIS — M47816 Spondylosis without myelopathy or radiculopathy, lumbar region: Secondary | ICD-10-CM | POA: Diagnosis not present

## 2019-04-25 ENCOUNTER — Ambulatory Visit: Payer: PPO | Admitting: Family Medicine

## 2019-04-26 ENCOUNTER — Encounter: Payer: Self-pay | Admitting: Family Medicine

## 2019-04-26 ENCOUNTER — Ambulatory Visit (INDEPENDENT_AMBULATORY_CARE_PROVIDER_SITE_OTHER): Payer: PPO | Admitting: Family Medicine

## 2019-04-26 ENCOUNTER — Other Ambulatory Visit: Payer: Self-pay

## 2019-04-26 VITALS — BP 138/64 | HR 63 | Temp 97.4°F | Ht 69.0 in | Wt 226.6 lb

## 2019-04-26 DIAGNOSIS — M79671 Pain in right foot: Secondary | ICD-10-CM | POA: Diagnosis not present

## 2019-04-26 DIAGNOSIS — L853 Xerosis cutis: Secondary | ICD-10-CM | POA: Diagnosis not present

## 2019-04-26 DIAGNOSIS — R0982 Postnasal drip: Secondary | ICD-10-CM

## 2019-04-26 DIAGNOSIS — I10 Essential (primary) hypertension: Secondary | ICD-10-CM | POA: Diagnosis not present

## 2019-04-26 MED ORDER — DICLOFENAC SODIUM 1 % EX GEL: 2.0000 g | Freq: Four times a day (QID) | CUTANEOUS | 2 refills | Status: DC

## 2019-04-26 NOTE — Patient Instructions (Signed)
° ° ° °  If you have lab work done today you will be contacted with your lab results within the next 2 weeks.  If you have not heard from us then please contact us. The fastest way to get your results is to register for My Chart. ° ° °IF you received an x-ray today, you will receive an invoice from [facility]. Please contact [facility] at [phone] with questions or concerns regarding your invoice.  ° °IF you received labwork today, you will receive an invoice from [facility]. Please contact [facility] at [phone] with questions or concerns regarding your invoice.  ° °Our billing staff will not be able to assist you with questions regarding bills from these companies. ° °You will be contacted with the lab results as soon as they are available. The fastest way to get your results is to activate your My Chart account. Instructions are located on the last page of this paperwork. If you have not heard from us regarding the results in 2 weeks, please contact this office. °  ° ° ° °

## 2019-04-26 NOTE — Progress Notes (Signed)
2/11/20218:55 AM  Katherine Mcpherson 19-Jun-1945, 74 y.o., female [id number]  Chief Complaint  Patient presents with  . Hypertension    rather have labs done with cards, havingg doulble ablation done on the back on 2/24    HPI:   Patient is a 74 y.o. female with past medical history significant for pafib on eliquis, HTN, GERD, HLP, CKD3, spondylosis with scoliosis  who presents today for routine followup  Last OV Aug 2020 - no changes She reports that she has been doing well She has completed covid immunizations Will see Dr Bettina Gavia, cards, in June of this year and would rather do labs for that visit She takes BP at home, rarely to 90/50s, last episode was in the fall She has not been working in her yard of recent due to back pain, is scheduled for nerve ablation with Dr Jacelyn Grip in the next couple of weeks  flonase not holding her postnasal drip and nasal congestion Tends to use flonase 2 sprays every morning Has 3 cats No sneezing or itchiness Not using anithistamine  Has a dry patch on left upper back that we should liked looked at  Had hammer toe fixed on right 2nd toe Having pain at the base of that toe - sole surface, at times significant enough that it wakes her up at night, - diclofenac gel helps   Depression screen [facility] 2/9 04/26/2019 11/08/2018 03/14/2018  Decreased Interest 0 0 0  Down, Depressed, Hopeless 0 0 0  PHQ - 2 Score 0 0 0    Fall Risk  04/26/2019 11/08/2018 10/24/2018 03/14/2018 11/29/2016  Falls in the past year? 0 1 1 0 No  Number falls in past yr: 0 0 - 0 -  Injury with Fall? 0 0 0 0 -  Comment - - - - -  Follow up - Falls evaluation completed;Education provided;Falls prevention discussed - - -     Allergies  Allergen Reactions  . Sulfa Antibiotics Other (See Comments)    Makes tongue feel funny and makes her feel like she will pass  Out.  Last for days.  . Penicillins Itching    Childhood allergy.   . Latex Rash    Had rash on lft side only after  back surgery    Prior to Admission medications   Medication Sig Start Date End Date Taking? Authorizing Provider  acetaminophen (TYLENOL) 650 MG CR tablet Take 650 mg by mouth 2 (two) times daily as needed for pain.   Yes [provider]  ELIQUIS 5 MG TABS tablet Take 1 tablet (5 mg total) by mouth 2 (two) times daily. 07/24/18  Yes Richardo Priest, MD  ezetimibe (ZETIA) 10 MG tablet Take 5 mg by mouth daily.   Yes [provider]  famotidine (PEPCID) 20 MG tablet Take 20 mg by mouth daily as needed.    Yes [provider]  fluticasone (FLONASE ALLERGY RELIEF) 50 MCG/ACT nasal spray Place 1 spray into both nostrils daily as needed for allergies or rhinitis.   Yes [provider]  lisinopril (ZESTRIL) 5 MG tablet Take 0.5 tablets (2.5 mg total) by mouth daily. 12/13/18  Yes Richardo Priest, MD  loratadine (CLARITIN) 10 MG tablet Take 10 mg by mouth daily as needed for allergies.   Yes [provider]  metoprolol tartrate (LOPRESSOR) 25 MG tablet Take 0.5 tablets (12.5 mg total) by mouth 2 (two) times daily. 01/02/19  Yes Richardo Priest, MD    Past Medical  History:  Diagnosis Date  . Allergy   . Anemia    "comes and goes" (01/22/2013)  . Arthritis    "qwhere" (01/22/2013)  . Chronic lower back pain    "ongoing since back OR 2013" (01/22/2013)  . Fall from slip, trip, or stumble 01/11/2013   "stubbed toe at gas pump at [place]" (01/22/2013)  . GERD (gastroesophageal reflux disease)   . H/O hiatal hernia   . High cholesterol   . Hypertension   . Polyp of colon 10/2009  . PONV (postoperative nausea and vomiting)     Past Surgical History:  Procedure Laterality Date  . ABDOMINAL HYSTERECTOMY  1999   TAH/BSO  . ANKLE FRACTURE SURGERY Right 1985  . ANKLE HARDWARE REMOVAL Right 1986  . COLONOSCOPY  08/2007   TUBULAR ADENOMAWITH DYSPLASIA  . FOOT FRACTURE SURGERY Left 1986   "piece of bone removed" (08/03/2012)  . FRACTURE SURGERY Bilateral     . HAMMER TOE SURGERY Right 2002  . JOINT REPLACEMENT    . KNEE ARTHROSCOPY Left 2001  . POSTERIOR LUMBAR FUSION  2013   L 5-S1  . RADIOFREQUENCY ABLATION NERVES     6 week ago 07/31/14  . SPINE SURGERY  01/2012  . TOTAL KNEE ARTHROPLASTY Right 07/31/2012   Procedure: TOTAL KNEE ARTHROPLASTY;  Surgeon: Kerin Salen, MD;  Location: [place];  Service: Orthopedics;  Laterality: Right;  DEPUY/SIGMA  . TOTAL KNEE ARTHROPLASTY Left 01/22/2013  . TOTAL KNEE ARTHROPLASTY Left 01/22/2013   Procedure: LEFT TOTAL KNEE ARTHROPLASTY;  Surgeon: Kerin Salen, MD;  Location: [place];  Service: Orthopedics;  Laterality: Left;    Social History   Tobacco Use  . Smoking status: Former Smoker    Packs/day: 0.50    Years: 8.00    Pack years: 4.00    Types: Cigarettes    Quit date: 03/15/1972    Years since quitting: 47.1  . Smokeless tobacco: Never Used  Substance Use Topics  . Alcohol use: Yes    Comment: occ/ rare wine-2 glasses of wine per month    Family History  Problem Relation Age of Onset  . Osteoporosis Mother   . Hypertension Mother   . Hypertension Sister   . Hypertension Sister   . Heart disease Father   . Hypertension Father   . Hyperlipidemia Father   . Cancer Paternal Uncle        PANCREATIC    Review of Systems  Constitutional: Negative for chills and fever.  Respiratory: Negative for cough and shortness of breath.   Cardiovascular: Negative for chest pain, palpitations and leg swelling.  Gastrointestinal: Negative for abdominal pain, nausea and vomiting.  per hpi   OBJECTIVE:  Today's Vitals   04/26/19 0823 04/26/19 0826  BP: (!) 146/67 138/64  Pulse: 63 63  Temp: (!) 97.5 F (36.4 C) (!) 97.4 F (36.3 C)  SpO2: 100% 100%  Weight: 226 lb 9.6 oz (102.8 kg) 226 lb 9.6 oz (102.8 kg)  Height: 5\' 9"  (1.753 m) 5\' 9"  (1.753 m)   Body mass index is 33.46 kg/m.   Physical Exam Vitals and nursing note reviewed.  Constitutional:      Appearance: She is  well-developed.  HENT:     Head: Normocephalic and atraumatic.     Mouth/Throat:     Pharynx: No oropharyngeal exudate.  Eyes:     General: No scleral icterus.    Conjunctiva/sclera: Conjunctivae normal.     Pupils: Pupils are equal, round, and reactive to  light.  Cardiovascular:     Rate and Rhythm: Normal rate and regular rhythm.     Pulses:          Dorsalis pedis pulses are 2+ on the right side.       Posterior tibial pulses are 2+ on the right side.     Heart sounds: Normal heart sounds. No murmur. No friction rub. No gallop.   Pulmonary:     Effort: Pulmonary effort is normal.     Breath sounds: Normal breath sounds. No wheezing, rhonchi or rales.  Musculoskeletal:     Cervical back: Neck supple.     Right lower leg: No edema.     Left lower leg: No edema.     Right foot: Normal range of motion.       Feet:  Feet:     Right foot:     Skin integrity: Skin integrity normal.     Toenail Condition: Fungal disease present. Skin:    General: Skin is warm and dry.     Comments: hyperpigmented excoriated scaly patch along left upper back  Neurological:     Mental Status: She is alert and oriented to person, place, and time.     No results found for this or any previous visit (from the past 24 hour(s)).  No results found.   ASSESSMENT and PLAN  1. Essential hypertension Controlled. Continue current regime.   2. Postnasal drip Discussed several OTC options, consider nasal ipratropium  3. Right foot pain - Ambulatory referral to Podiatry  4. Dry skin Discussed OTC hydrocortisone use  Other orders - diclofenac Sodium (VOLTAREN) 1 % GEL; Apply 2 g topically 4 (four) times daily.  Return in about 1 year (around 04/25/2020).    Rutherford Guys, MD [facility] at [place], [place] [address] Ph.  [phone] Fax [phone]

## 2019-05-01 ENCOUNTER — Encounter: Payer: Self-pay | Admitting: Podiatry

## 2019-05-01 ENCOUNTER — Ambulatory Visit: Payer: PPO | Admitting: Podiatry

## 2019-05-01 ENCOUNTER — Other Ambulatory Visit: Payer: Self-pay | Admitting: Podiatry

## 2019-05-01 ENCOUNTER — Ambulatory Visit (INDEPENDENT_AMBULATORY_CARE_PROVIDER_SITE_OTHER): Payer: PPO

## 2019-05-01 ENCOUNTER — Other Ambulatory Visit: Payer: Self-pay

## 2019-05-01 VITALS — BP 153/78 | HR 64 | Resp 16

## 2019-05-01 DIAGNOSIS — G5761 Lesion of plantar nerve, right lower limb: Secondary | ICD-10-CM

## 2019-05-01 DIAGNOSIS — G5781 Other specified mononeuropathies of right lower limb: Secondary | ICD-10-CM

## 2019-05-01 DIAGNOSIS — M778 Other enthesopathies, not elsewhere classified: Secondary | ICD-10-CM

## 2019-05-01 NOTE — Progress Notes (Signed)
Subjective:  Patient ID: Katherine Mcpherson, female    DOB: 1946/01/05,  MRN: [id number] HPI Chief Complaint  Patient presents with  . Foot Pain    Plantar forefoot and 2nd toe right - aching x 3 weeks, no recent injury, fractured foot 1980's, worse at night, pain intermittent, previous hammer toe surgery, tried voltaren gel-does help some  . New Patient (Initial Visit)    74 y.o. female presents with the above complaint.   ROS: Denies fever chills nausea vomiting muscle aches pains calf pain back pain chest pain shortness of breath.  Past Medical History:  Diagnosis Date  . Allergy   . Anemia    "comes and goes" (01/22/2013)  . Arthritis    "qwhere" (01/22/2013)  . Chronic lower back pain    "ongoing since back OR 2013" (01/22/2013)  . Fall from slip, trip, or stumble 01/11/2013   "stubbed toe at gas pump at [place]" (01/22/2013)  . GERD (gastroesophageal reflux disease)   . H/O hiatal hernia   . High cholesterol   . Hypertension   . Polyp of colon 10/2009  . PONV (postoperative nausea and vomiting)    Past Surgical History:  Procedure Laterality Date  . ABDOMINAL HYSTERECTOMY  1999   TAH/BSO  . ANKLE FRACTURE SURGERY Right 1985  . ANKLE HARDWARE REMOVAL Right 1986  . COLONOSCOPY  08/2007   TUBULAR ADENOMAWITH DYSPLASIA  . FOOT FRACTURE SURGERY Left 1986   "piece of bone removed" (08/03/2012)  . FRACTURE SURGERY Bilateral   . HAMMER TOE SURGERY Right 2002  . JOINT REPLACEMENT    . KNEE ARTHROSCOPY Left 2001  . POSTERIOR LUMBAR FUSION  2013   L 5-S1  . RADIOFREQUENCY ABLATION NERVES     6 week ago 07/31/14  . SPINE SURGERY  01/2012  . TOTAL KNEE ARTHROPLASTY Right 07/31/2012   Procedure: TOTAL KNEE ARTHROPLASTY;  Surgeon: Kerin Salen, MD;  Location: [facility];  Service: Orthopedics;  Laterality: Right;  DEPUY/SIGMA  . TOTAL KNEE ARTHROPLASTY Left 01/22/2013  . TOTAL KNEE ARTHROPLASTY Left 01/22/2013   Procedure: LEFT TOTAL KNEE ARTHROPLASTY;  Surgeon: Kerin Salen,  MD;  Location: [place];  Service: Orthopedics;  Laterality: Left;    Current Outpatient Medications:  .  acetaminophen (TYLENOL) 650 MG CR tablet, Take 650 mg by mouth 2 (two) times daily as needed for pain., Disp: , Rfl:  .  diclofenac Sodium (VOLTAREN) 1 % GEL, Apply 2 g topically 4 (four) times daily., Disp: 100 g, Rfl: 2 .  ELIQUIS 5 MG TABS tablet, Take 1 tablet (5 mg total) by mouth 2 (two) times daily., Disp: 180 tablet, Rfl: 2 .  ezetimibe (ZETIA) 10 MG tablet, Take 5 mg by mouth daily., Disp: , Rfl:  .  famotidine (PEPCID) 20 MG tablet, Take 20 mg by mouth daily as needed. , Disp: , Rfl:  .  fluticasone (FLONASE ALLERGY RELIEF) 50 MCG/ACT nasal spray, Place 1 spray into both nostrils daily as needed for allergies or rhinitis., Disp: , Rfl:  .  lisinopril (ZESTRIL) 5 MG tablet, Take 0.5 tablets (2.5 mg total) by mouth daily., Disp: 45 tablet, Rfl: 1 .  loratadine (CLARITIN) 10 MG tablet, Take 10 mg by mouth daily as needed for allergies., Disp: , Rfl:  .  metoprolol tartrate (LOPRESSOR) 25 MG tablet, Take 0.5 tablets (12.5 mg total) by mouth 2 (two) times daily., Disp: 90 tablet, Rfl: 1  Allergies  Allergen Reactions  . Sulfa Antibiotics Other (See Comments)  Makes tongue feel funny and makes her feel like she will pass  Out.  Last for days.  . Penicillins Itching    Childhood allergy.   . Latex Rash    Had rash on lft side only after back surgery   Review of Systems Objective:   Vitals:   05/01/19 0910  BP: (!) 153/78  Pulse: 64  Resp: 16    General: Well developed, nourished, in no acute distress, alert and oriented x3   Dermatological: Skin is warm, dry and supple bilateral. Nails x 10 are well maintained; remaining integument appears unremarkable at this time. There are no open sores, no preulcerative lesions, no rash or signs of infection present.  Vascular: Dorsalis Pedis artery and Posterior Tibial artery pedal pulses are 2/4 bilateral with immedate capillary fill  time. Pedal hair growth present. No varicosities and no lower extremity edema present bilateral.   Neruologic: Grossly intact via light touch bilateral. Vibratory intact via tuning fork bilateral. Protective threshold with Semmes Wienstein monofilament intact to all pedal sites bilateral. Patellar and Achilles deep tendon reflexes 2+ bilateral. No Babinski or clonus noted bilateral.   Musculoskeletal: No gross boney pedal deformities bilateral. No pain, crepitus, or limitation noted with foot and ankle range of motion bilateral. Muscular strength 5/5 in all groups tested bilateral.  She has pain with palpation and range of motion of the second metatarsophalangeal joint pain on palpation with a palpable Mulder's click to the third interdigital space in the second interdigital space of the right foot.  Gait: Unassisted, Nonantalgic.    Radiographs:  Radiographs taken today demonstrate osteopenia and some osteoarthritis at the second metatarsophalangeal joint right foot.  Assessment & Plan:   Assessment: Capsulitis of the second metatarsophalangeal joint and neuroma neuritis to the second interdigital space and third interspace all the right foot.  Plan: At this point I injected both sites with 10 mg of Kenalog 5 mg Marcaine point maximal tenderness second and third interdigital spaces or intermetatarsal spaces.  She tolerated procedure well without complications.  Discussed appropriate shoe gear stretching exercise ice therapy sugar modifications.     Tarick Parenteau T. [place], [place]

## 2019-05-16 DIAGNOSIS — M47816 Spondylosis without myelopathy or radiculopathy, lumbar region: Secondary | ICD-10-CM | POA: Diagnosis not present

## 2019-05-29 DIAGNOSIS — M5416 Radiculopathy, lumbar region: Secondary | ICD-10-CM | POA: Diagnosis not present

## 2019-05-31 ENCOUNTER — Ambulatory Visit: Payer: PPO | Admitting: Podiatry

## 2019-06-08 ENCOUNTER — Telehealth: Payer: Self-pay | Admitting: Cardiology

## 2019-06-08 ENCOUNTER — Telehealth: Payer: Self-pay

## 2019-06-08 DIAGNOSIS — I10 Essential (primary) hypertension: Secondary | ICD-10-CM

## 2019-06-08 MED ORDER — LISINOPRIL 5 MG PO TABS: 2.5000 mg | ORAL_TABLET | Freq: Every day | ORAL | 1 refills | Status: DC

## 2019-06-08 NOTE — Telephone Encounter (Signed)
Refilled patients Lisinopril per [facility] mail order pharmacy request.

## 2019-06-08 NOTE — Telephone Encounter (Signed)
*  STAT* If patient is at the pharmacy, call can be transferred to refill team.   1. Which medications need to be refilled? (please list name of each medication and dose if known) lisinopril (ZESTRIL) 5 MG tablet  2. Which pharmacy/location (including street and city if local pharmacy) is medication to be sent to? [facility] - [place], [place]  3. Do they need a 30 day or 90 day supply? 90 day

## 2019-06-08 NOTE — Telephone Encounter (Signed)
Medication has been filled 

## 2019-06-18 DIAGNOSIS — M47816 Spondylosis without myelopathy or radiculopathy, lumbar region: Secondary | ICD-10-CM | POA: Diagnosis not present

## 2019-06-20 ENCOUNTER — Encounter: Payer: Self-pay | Admitting: Family Medicine

## 2019-06-21 NOTE — Telephone Encounter (Signed)
Patient is requesting a refill of the following medications: Requested Prescriptions    No prescriptions requested or ordered in this encounter  Diclofenac Sodium 1% Gel  Date of patient request: 06/21/2019 Last office visit: 04/26/2019 Date of last refill: 04/26/2019 Last refill amount: 100 g Follow up time period per chart: 1 year

## 2019-06-21 NOTE — Telephone Encounter (Signed)
I am sorry please ignore my previous message about diclofenac sodium I had not realized the pt was referring to 50 mg Diclofenac tablets as they're not on her medication list.  She would like Diclofenac 50 mg for arthritic pain, this is a prescription she's previously received from other doctor that has now retired.  Should she come see a provider to receive this? Last OV 04/26/2019

## 2019-07-03 DIAGNOSIS — M47816 Spondylosis without myelopathy or radiculopathy, lumbar region: Secondary | ICD-10-CM | POA: Diagnosis not present

## 2019-07-06 ENCOUNTER — Other Ambulatory Visit: Payer: Self-pay | Admitting: Cardiology

## 2019-07-06 NOTE — Telephone Encounter (Signed)
73 F 107.8 kg SCr 1.23 (08/2018), scheduled 6/21 [facility]

## 2019-08-06 DIAGNOSIS — M47816 Spondylosis without myelopathy or radiculopathy, lumbar region: Secondary | ICD-10-CM | POA: Diagnosis not present

## 2019-08-19 NOTE — Progress Notes (Signed)
Cardiology Office Note:    Date:  08/20/2019   ID:  Katherine Mcpherson, DOB [birth year], MRN [id number]  PCP:  Rutherford Guys, MD  Cardiologist:  Shirlee More, MD    Referring MD: Rutherford Guys, MD    ASSESSMENT:    1. Paroxysmal atrial fibrillation (HCC)   2. Hypertensive heart disease without heart failure   3. Long term current use of anticoagulant therapy    PLAN:    In order of problems listed above:  1. Stable maintaining sinus rhythm continue her beta-blocker anticoagulant this time does not require an antiarrhythmic drug 2. Stable repeat blood pressure in the office at [facility] after rest continue current treatment including ACE inhibitor check labs including renal function potassium lipid profile CMP and CBC 3. Continue her anticoagulant   Next appointment: 1 year   Medication Adjustments/Labs and Tests Ordered: Current medicines are reviewed at length with the patient today.  Concerns regarding medicines are outlined above.  Orders Placed This Encounter  Procedures  . EKG 12-Lead   No orders of the defined types were placed in this encounter.   No chief complaint on file.   History of Present Illness:    Katherine Mcpherson is a 74 y.o. female with a hx of atrial fibrillation CHADs2 vasc score of 2 anticoagulated stage 3 CKD hypertension and hyperlipidemia  with apixaban  last seen 08/02/2018. Compliance with diet, lifestyle and medications: Yes  She had an echocardiogram performed 09/06/2018 showing mild concentric LVH normal left ventricular function ejection fraction 60 to 65% and normal right ventricular function.  She had mild to moderate mitral annular calcification with mild to moderate mitral regurgitation and mild aortic regurgitation with aortic valve sclerosis.  She has had a good year she has had no recurrence of clinical atrial fibrillation tolerates her anticoagulant without bleeding.  She has ongoing problems with back and radicular pain but is  improved with Cymbalta therapy.  No palpitations syncope edema shortness of breath orthopnea chest pain. Past Medical History:  Diagnosis Date  . Allergy   . Anemia    "comes and goes" (01/22/2013)  . Aortic regurgitation 07/21/2018  . Arthritis    "qwhere" (01/22/2013)  . Chronic lower back pain    "ongoing since back OR 2013" (01/22/2013)  . Fall from slip, trip, or stumble 01/11/2013   "stubbed toe at gas pump at [place]" (01/22/2013)  . Gastroesophageal reflux disease 08/30/2016   Seen by Dr. [name] 08/17/2016  . GERD (gastroesophageal reflux disease)   . H/O hiatal hernia   . High cholesterol   . Hypertension   . Hypertensive heart disease 12/30/2011  . Long term current use of anticoagulant therapy 09/10/2016  . Low back pain 12/15/2011   Dr. Hoyle Barr  LBP with RLE pain with L5-S1 spondyiolithesis and scoliosis.   . Menopausal state 12/30/2011  . Obesity, Class I, BMI 30-34.9 12/30/2011  . Paroxysmal atrial fibrillation ([place]) 09/10/2016  . Polyp of colon 10/2009  . PONV (postoperative nausea and vomiting)   . Pure hypercholesterolemia 09/10/2016  . Torn rotator cuff 03/13/2019  . Trochanteric bursitis of left hip 09/10/2016  . Vaginal atrophy 12/30/2011    Past Surgical History:  Procedure Laterality Date  . ABDOMINAL HYSTERECTOMY  1999   TAH/BSO  . ANKLE FRACTURE SURGERY Right 1985  . ANKLE HARDWARE REMOVAL Right 1986  . COLONOSCOPY  08/2007   TUBULAR ADENOMAWITH DYSPLASIA  . FOOT FRACTURE SURGERY Left 1986   "piece of bone removed" (08/03/2012)  .  FRACTURE SURGERY Bilateral   . HAMMER TOE SURGERY Right 2002  . JOINT REPLACEMENT    . KNEE ARTHROSCOPY Left 2001  . POSTERIOR LUMBAR FUSION  2013   L 5-S1  . RADIOFREQUENCY ABLATION NERVES     6 week ago 07/31/14  . SPINE SURGERY  01/2012  . TOTAL KNEE ARTHROPLASTY Right 07/31/2012   Procedure: TOTAL KNEE ARTHROPLASTY;  Surgeon: Kerin Salen, MD;  Location: [place];  Service: Orthopedics;  Laterality: Right;   DEPUY/SIGMA  . TOTAL KNEE ARTHROPLASTY Left 01/22/2013  . TOTAL KNEE ARTHROPLASTY Left 01/22/2013   Procedure: LEFT TOTAL KNEE ARTHROPLASTY;  Surgeon: Kerin Salen, MD;  Location: [facility];  Service: Orthopedics;  Laterality: Left;    Current Medications: Current Meds  Medication Sig  . acetaminophen (TYLENOL) 650 MG CR tablet Take 650 mg by mouth 2 (two) times daily as needed for pain.  . DULoxetine HCl 60 MG CSDR   . ELIQUIS 5 MG TABS tablet Take 1 tablet by mouth twice a day  . ezetimibe (ZETIA) 10 MG tablet Take 5 mg by mouth daily.  . famotidine (PEPCID) 20 MG tablet Take 20 mg by mouth daily as needed.   . fluticasone (FLONASE ALLERGY RELIEF) 50 MCG/ACT nasal spray Place 1 spray into both nostrils daily as needed for allergies or rhinitis.  Marland Kitchen gabapentin (NEURONTIN) 300 MG capsule Take 1 capsule by mouth at bedtime  for 3 days, take 1 capsule twice a day for 3 days, take 1 capsule three times a days for 3 days  . lisinopril (ZESTRIL) 5 MG tablet Take 0.5 tablets (2.5 mg total) by mouth daily.  Marland Kitchen loratadine (CLARITIN) 10 MG tablet Take 10 mg by mouth daily as needed for allergies.  . methocarbamol (ROBAXIN) 500 MG tablet Take 500 mg by mouth 2 (two) times daily as needed.  . metoprolol tartrate (LOPRESSOR) 25 MG tablet      Allergies:   Sulfa antibiotics, Penicillins, and Latex   Social History   Socioeconomic History  . Marital status: Single    Spouse name: Not on file  . Number of children: Not on file  . Years of education: Not on file  . Highest education level: Not on file  Occupational History  . Occupation: retired  Tobacco Use  . Smoking status: Former Smoker    Packs/day: 0.50    Years: 8.00    Pack years: 4.00    Types: Cigarettes    Quit date: 03/15/1972    Years since quitting: 47.4  . Smokeless tobacco: Never Used  Substance and Sexual Activity  . Alcohol use: Yes    Comment: occ/ rare wine-2 glasses of wine per month  . Drug use: No  . Sexual activity:  Never  Other Topics Concern  . Not on file  Social History Narrative   Exercise riding bike at [employer] and walking   Social Determinants of Health   Financial Resource Strain:   . Difficulty of Paying Living Expenses:   Food Insecurity:   . Worried About Charity fundraiser in the Last Year:   . Arboriculturist in the Last Year:   Transportation Needs:   . Film/video editor (Medical):   Marland Kitchen Lack of Transportation (Non-Medical):   Physical Activity:   . Days of Exercise per Week:   . Minutes of Exercise per Session:   Stress:   . Feeling of Stress :   Social Connections:   . Frequency of Communication with  Friends and Family:   . Frequency of Social Gatherings with Friends and Family:   . Attends Religious Services:   . Active Member of Clubs or Organizations:   . Attends Archivist Meetings:   Marland Kitchen Marital Status:      Family History: The patient's family history includes Cancer in her paternal uncle; Heart disease in her father; Hyperlipidemia in her father; Hypertension in her father, mother, sister, and sister; Osteoporosis in her mother. ROS:   Please see the history of present illness.    All other systems reviewed and are negative.  EKGs/Labs/Other Studies Reviewed:    The following studies were reviewed today:  EKG:  EKG ordered today and personally reviewed.  The ekg ordered today demonstrates sinus rhythm left axis deviation otherwise normal  Recent Labs: 09/06/2018: ALT 16; BUN 41; Creatinine, Ser 1.23; Hemoglobin 11.9; Platelets 343; Potassium 5.1; Sodium 140  Recent Lipid Panel    Component Value Date/Time   CHOL 229 (H) 09/06/2018 1008   TRIG 95 09/06/2018 1008   HDL 73 09/06/2018 1008   CHOLHDL 3.1 09/06/2018 1008   CHOLHDL 3.0 08/25/2015 0853   VLDL 15 08/25/2015 0853   LDLCALC 137 (H) 09/06/2018 1008    Physical Exam:    VS:  BP (!) 150/82   Pulse (!) 51   Ht 5\' 9"  (1.753 m)   Wt 225 lb 1.9 oz (102.1 kg)   SpO2 98%   BMI  33.24 kg/m    Repeat blood pressure 1 3676 Wt Readings from Last 3 Encounters:  08/20/19 225 lb 1.9 oz (102.1 kg)  04/26/19 226 lb 9.6 oz (102.8 kg)  11/08/18 226 lb (102.5 kg)     GEN:  Well nourished, well developed in no acute distress HEENT: Normal NECK: No JVD; No carotid bruits LYMPHATICS: No lymphadenopathy CARDIAC: RRR, no murmurs, rubs, gallops RESPIRATORY:  Clear to auscultation without rales, wheezing or rhonchi  ABDOMEN: Soft, non-tender, non-distended MUSCULOSKELETAL:  No edema; No deformity  SKIN: Warm and dry NEUROLOGIC:  Alert and oriented x 3 PSYCHIATRIC:  Normal affect    Signed, Shirlee More, MD  08/20/2019 10:58 AM    [place]

## 2019-08-20 ENCOUNTER — Encounter: Payer: Self-pay | Admitting: Cardiology

## 2019-08-20 ENCOUNTER — Ambulatory Visit: Payer: PPO | Admitting: Cardiology

## 2019-08-20 ENCOUNTER — Other Ambulatory Visit: Payer: Self-pay

## 2019-08-20 VITALS — BP 150/82 | HR 51 | Ht 69.0 in | Wt 225.1 lb

## 2019-08-20 DIAGNOSIS — I119 Hypertensive heart disease without heart failure: Secondary | ICD-10-CM | POA: Diagnosis not present

## 2019-08-20 DIAGNOSIS — I480 Paroxysmal atrial fibrillation: Secondary | ICD-10-CM | POA: Diagnosis not present

## 2019-08-20 DIAGNOSIS — Z7901 Long term (current) use of anticoagulants: Secondary | ICD-10-CM

## 2019-08-20 NOTE — Patient Instructions (Signed)
Medication Instructions:  Your physician recommends that you continue on your current medications as directed. Please refer to the Current Medication list given to you today.  *If you need a refill on your cardiac medications before your next appointment, please call your pharmacy*   Lab Work: Your physician recommends that you return for lab work in: [place], CBC, Lipids If you have labs (blood work) drawn today and your tests are completely normal, you will receive your results only by: Marland Kitchen MyChart Message (if you have MyChart) OR . A paper copy in the mail If you have any lab test that is abnormal or we need to change your treatment, we will call you to review the results.   Testing/Procedures: None   Follow-Up: At [facility], you and your health needs are our priority.  As part of our continuing mission to provide you with exceptional heart care, we have created designated Provider Care Teams.  These Care Teams include your primary Cardiologist (physician) and Advanced Practice Providers (APPs -  Physician Assistants and Nurse Practitioners) who all work together to provide you with the care you need, when you need it.  We recommend signing up for the patient portal called "MyChart".  Sign up information is provided on this After Visit Summary.  MyChart is used to connect with patients for Virtual Visits (Telemedicine).  Patients are able to view lab/test results, encounter notes, upcoming appointments, etc.  Non-urgent messages can be sent to your provider as well.   To learn more about what you can do with MyChart, go to [email or web address].    Your next appointment:   1 year(s)  The format for your next appointment:   In Person  Provider:   Shirlee More, MD   Other Instructions

## 2019-08-21 ENCOUNTER — Telehealth: Payer: Self-pay

## 2019-08-21 LAB — COMPREHENSIVE METABOLIC PANEL
ALT: 13 IU/L (ref 0–32)
AST: 18 IU/L (ref 0–40)
Albumin/Globulin Ratio: 2 (ref 1.2–2.2)
Albumin: 4.1 g/dL (ref 3.7–4.7)
Alkaline Phosphatase: 58 IU/L (ref 48–121)
BUN/Creatinine Ratio: 17 (ref 12–28)
BUN: 24 mg/dL (ref 8–27)
Bilirubin Total: 0.4 mg/dL (ref 0.0–1.2)
CO2: 23 mmol/L (ref 20–29)
Calcium: 9 mg/dL (ref 8.7–10.3)
Chloride: 104 mmol/L (ref 96–106)
Creatinine, Ser: 1.43 mg/dL — ABNORMAL HIGH (ref 0.57–1.00)
GFR calc Af Amer: 41 mL/min/{1.73_m2} — ABNORMAL LOW (ref >59)
GFR calc non Af Amer: 36 mL/min/{1.73_m2} — ABNORMAL LOW (ref >59)
Globulin, Total: 2.1 g/dL (ref 1.5–4.5)
Glucose: 105 mg/dL — ABNORMAL HIGH (ref 65–99)
Potassium: 5 mmol/L (ref 3.5–5.2)
Sodium: 140 mmol/L (ref 134–144)
Total Protein: 6.2 g/dL (ref 6.0–8.5)

## 2019-08-21 LAB — CBC
Hematocrit: 40.2 % (ref 34.0–46.6)
Hemoglobin: 13.3 g/dL (ref 11.1–15.9)
MCH: 29.4 pg (ref 26.6–33.0)
MCHC: 33.1 g/dL (ref 31.5–35.7)
MCV: 89 fL (ref 79–97)
Platelets: 279 10*3/uL (ref 150–450)
RBC: 4.53 x10E6/uL (ref 3.77–5.28)
RDW: 12.5 % (ref 11.7–15.4)
WBC: 7.4 10*3/uL (ref 3.4–10.8)

## 2019-08-21 LAB — LIPID PANEL
Chol/HDL Ratio: 3.4 ratio (ref 0.0–4.4)
Cholesterol, Total: 209 mg/dL — ABNORMAL HIGH (ref 100–199)
HDL: 62 mg/dL (ref >39)
LDL Chol Calc (NIH): 127 mg/dL — ABNORMAL HIGH (ref 0–99)
Triglycerides: 112 mg/dL (ref 0–149)
VLDL Cholesterol Cal: 20 mg/dL (ref 5–40)

## 2019-08-21 NOTE — Telephone Encounter (Signed)
Spoke with patient regarding results and recommendation.  Patient verbalizes understanding and is agreeable to plan of care. Advised patient to call back with any issues or concerns.  

## 2019-08-21 NOTE — Telephone Encounter (Signed)
-----   Message from Richardo Priest, MD sent at 08/21/2019  7:38 AM EDT ----- Normal or stable result  Please have her stop her hydrochlorothiazide pending cardiac CTA

## 2019-08-27 ENCOUNTER — Other Ambulatory Visit: Payer: Self-pay | Admitting: Cardiology

## 2019-08-29 ENCOUNTER — Other Ambulatory Visit: Payer: Self-pay | Admitting: Cardiology

## 2019-08-29 MED ORDER — METOPROLOL TARTRATE 25 MG PO TABS: 12.5000 mg | ORAL_TABLET | Freq: Two times a day (BID) | ORAL | 3 refills | Status: DC

## 2019-08-29 NOTE — Telephone Encounter (Signed)
*  STAT* If patient is at the pharmacy, call can be transferred to refill team.   1. Which medications need to be refilled? (please list name of each medication and dose if known) metoprolol tartrate (LOPRESSOR) 25 MG tablet  2. Which pharmacy/location (including street and city if local pharmacy) is medication to be sent to? [facility] - [place], [place]  3. Do they need a 30 day or 90 day supply? [place]

## 2019-08-29 NOTE — Telephone Encounter (Signed)
Refill sent in per request.  

## 2019-09-03 DIAGNOSIS — H25813 Combined forms of age-related cataract, bilateral: Secondary | ICD-10-CM | POA: Diagnosis not present

## 2019-09-03 DIAGNOSIS — H11153 Pinguecula, bilateral: Secondary | ICD-10-CM | POA: Diagnosis not present

## 2019-10-22 DIAGNOSIS — M4696 Unspecified inflammatory spondylopathy, lumbar region: Secondary | ICD-10-CM | POA: Diagnosis not present

## 2019-11-26 ENCOUNTER — Encounter: Payer: Self-pay | Admitting: Family Medicine

## 2019-11-26 NOTE — Telephone Encounter (Signed)
Message was sent to julie due to patient being tomorrow

## 2019-11-27 ENCOUNTER — Ambulatory Visit (INDEPENDENT_AMBULATORY_CARE_PROVIDER_SITE_OTHER): Payer: PPO | Admitting: Family Medicine

## 2019-11-27 VITALS — BP 150/82 | Ht 69.0 in | Wt 225.0 lb

## 2019-11-27 DIAGNOSIS — Z0000 Encounter for general adult medical examination without abnormal findings: Secondary | ICD-10-CM | POA: Diagnosis not present

## 2019-11-27 NOTE — Patient Instructions (Addendum)
Thank you for taking time to come for your Medicare Wellness Visit. I appreciate your ongoing commitment to your health goals. Please review the following plan we discussed and let me know if I can assist you in the future.  Ivelise Castillo LPN  Preventive Care 76 Years and Older, Female Preventive care refers to lifestyle choices and visits with your health care provider that can promote health and wellness. This includes:  A yearly physical exam. This is also called an annual well check.  Regular dental and eye exams.  Immunizations.  Screening for certain conditions.  Healthy lifestyle choices, such as diet and exercise. What can I expect for my preventive care visit? Physical exam Your health care provider will check:  Height and weight. These may be used to calculate body mass index (BMI), which is a measurement that tells if you are at a healthy weight.  Heart rate and blood pressure.  Your skin for abnormal spots. Counseling Your health care provider may ask you questions about:  Alcohol, tobacco, and drug use.  Emotional well-being.  Home and relationship well-being.  Sexual activity.  Eating habits.  History of falls.  Memory and ability to understand (cognition).  Work and work environment.  Pregnancy and menstrual history. What immunizations do I need?  Influenza (flu) vaccine  This is recommended every year. Tetanus, diphtheria, and pertussis (Tdap) vaccine  You may need a Td booster every 10 years. Varicella (chickenpox) vaccine  You may need this vaccine if you have not already been vaccinated. Zoster (shingles) vaccine  You may need this after age 76. Pneumococcal conjugate (PCV13) vaccine  One dose is recommended after age 76. Pneumococcal polysaccharide (PPSV23) vaccine  One dose is recommended after age 76. Measles, mumps, and rubella (MMR) vaccine  You may need at least one dose of MMR if you were born in 1957 or later. You may also  need a second dose. Meningococcal conjugate (MenACWY) vaccine  You may need this if you have certain conditions. Hepatitis A vaccine  You may need this if you have certain conditions or if you travel or work in places where you may be exposed to hepatitis A. Hepatitis B vaccine  You may need this if you have certain conditions or if you travel or work in places where you may be exposed to hepatitis B. Haemophilus influenzae type b (Hib) vaccine  You may need this if you have certain conditions. You may receive vaccines as individual doses or as more than one vaccine together in one shot (combination vaccines). Talk with your health care provider about the risks and benefits of combination vaccines. What tests do I need? Blood tests  Lipid and cholesterol levels. These may be checked every 5 years, or more frequently depending on your overall health.  Hepatitis C test.  Hepatitis B test. Screening  Lung cancer screening. You may have this screening every year starting at age 55 if you have a 30-pack-year history of smoking and currently smoke or have quit within the past 15 years.  Colorectal cancer screening. All adults should have this screening starting at age 50 and continuing until age 76. Your health care provider may recommend screening at age 45 if you are at increased risk. You will have tests every 1-10 years, depending on your results and the type of screening test.  Diabetes screening. This is done by checking your blood sugar (glucose) after you have not eaten for a while (fasting). You may have this done every 1-3   years.  Mammogram. This may be done every 1-2 years. Talk with your health care provider about how often you should have regular mammograms.  BRCA-related cancer screening. This may be done if you have a family history of breast, ovarian, tubal, or peritoneal cancers. Other tests  Sexually transmitted disease (STD) testing.  Bone density scan. This is done  to screen for osteoporosis. You may have this done starting at age 76. Follow these instructions at home: Eating and drinking  Eat a diet that includes fresh fruits and vegetables, whole grains, lean protein, and low-fat dairy products. Limit your intake of foods with high amounts of sugar, saturated fats, and salt.  Take vitamin and mineral supplements as recommended by your health care provider.  Do not drink alcohol if your health care provider tells you not to drink.  If you drink alcohol: ? Limit how much you have to 0-1 drink a day. ? Be aware of how much alcohol is in your drink. In the [place] one drink equals one 12 oz bottle of beer (355 mL), one 5 oz glass of wine (148 mL), or one 1 oz glass of hard liquor (44 mL). Lifestyle  Take daily care of your teeth and gums.  Stay active. Exercise for at least 30 minutes on 5 or more days each week.  Do not use any products that contain nicotine or tobacco, such as cigarettes, e-cigarettes, and chewing tobacco. If you need help quitting, ask your health care provider.  If you are sexually active, practice safe sex. Use a condom or other form of protection in order to prevent STIs (sexually transmitted infections).  Talk with your health care provider about taking a low-dose aspirin or statin. What's next?  Go to your health care provider once a year for a well check visit.  Ask your health care provider how often you should have your eyes and teeth checked.  Stay up to date on all vaccines. This information is not intended to replace advice given to you by your health care provider. Make sure you discuss any questions you have with your health care provider. Document Revised: 02/23/2018 Document Reviewed: 02/23/2018 Elsevier Patient Education  2020 [employer].

## 2019-11-27 NOTE — Progress Notes (Signed)
Presents today for [employer] Visit   Date of last exam: 04-26-2019  Interpreter used for this visit? No  I connected with  Katherine Mcpherson on 11/27/19 by a telephone application and verified that I am speaking with the correct person using two identifiers.   I discussed the limitations of evaluation and management by telemedicine. The patient expressed understanding and agreed to proceed.  Patient location: home  Provider location: in office  I provided 20 minutes of non face - to - face time during this encounter.  Patient Care Team: Rutherford Guys, MD as PCP - General (Family Medicine) Starling Manns, MD as Surgeon (Orthopedic Surgery) [name], MD as Consulting Physician (Orthopedic Surgery) [name], MD (Rehabilitation) [name], MD as Consulting Physician (Orthopedic Surgery) [name], MD as Consulting Physician (Gastroenterology) Richardo Priest, MD as Consulting Physician (Cardiology) [name], MD as Referring Physician (Orthopedic Surgery)   Other items to address today:   Discussed Eye/Dental Discussed immunizations   Other Screening: Last screening for diabetes: 08/20/2019 Last lipid screening: 08/20/2019  ADVANCE DIRECTIVES: Discussed: yes On File: no Materials Provided:no  Immunization status:  Immunization History  Administered Date(s) Administered  . Fluad Quad(high Dose 65+) 11/04/2018  . Influenza Split 11/30/2011, 12/02/2016  . Influenza, High Dose Seasonal PF 12/06/2017  . Influenza,inj,Quad PF,6+ Mos 12/03/2015  . Influenza-Unspecified 12/13/2012, 12/04/2013, 12/24/2014, 11/04/2018  . PFIZER SARS-COV-2 Vaccination 03/28/2019, 04/17/2019  . Pneumococcal Conjugate-13 01/22/2014  . Pneumococcal Polysaccharide-23 01/14/2015  . Tdap 12/30/2011  . Zoster 03/13/2010  . Zoster Recombinat (Shingrix) 11/04/2018, 02/26/2019     Health Maintenance Due  Topic Date Due  . INFLUENZA VACCINE  10/14/2019  .  COLONOSCOPY  10/23/2019     Functional Status Survey: Is the patient deaf or have difficulty hearing?: No Does the patient have difficulty seeing, even when wearing glasses/contacts?: No Does the patient have difficulty concentrating, remembering, or making decisions?: No Does the patient have difficulty walking or climbing stairs?: No Does the patient have difficulty dressing or bathing?: No Does the patient have difficulty doing errands alone such as visiting a doctor's office or shopping?: No   6CIT Screen 11/27/2019 11/08/2018  What Year? 0 points 0 points  What month? 0 points 0 points  What time? 0 points 0 points  Count back from 20 0 points 0 points  Months in reverse 0 points 0 points  Repeat phrase 0 points 0 points  Total Score 0 0        Clinical Support from 11/27/2019 in [place] at [facility]  AUDIT-C Score 2       Home Environment:   A little trouble climbing stairs to due back pain Lives in one story home No scattered rugs Yes grab bars  Adequate lighting/ no clutter   Patient Active Problem List   Diagnosis Date Noted  . Torn rotator cuff 03/13/2019  . Aortic regurgitation 07/21/2018  . Long term current use of anticoagulant therapy 09/10/2016  . Trochanteric bursitis of left hip 09/10/2016  . Paroxysmal atrial fibrillation ([place]) 09/10/2016  . Pure hypercholesterolemia 09/10/2016  . Gastroesophageal reflux disease 08/30/2016  . Hypertensive heart disease 12/30/2011  . Obesity, Class I, BMI 30-34.9 12/30/2011  . Vaginal atrophy 12/30/2011  . Menopausal state 12/30/2011  . Low back pain 12/15/2011     Past Medical History:  Diagnosis Date  . Allergy   . Anemia    "comes and goes" (01/22/2013)  . Aortic regurgitation 07/21/2018  .  Arthritis    "qwhere" (01/22/2013)  . Chronic lower back pain    "ongoing since back OR 2013" (01/22/2013)  . Fall from slip, trip, or stumble 01/11/2013   "stubbed toe at gas pump at [place]" (01/22/2013)  .  Gastroesophageal reflux disease 08/30/2016   Seen by Dr. [name] 08/17/2016  . GERD (gastroesophageal reflux disease)   . H/O hiatal hernia   . High cholesterol   . Hypertension   . Hypertensive heart disease 12/30/2011  . Long term current use of anticoagulant therapy 09/10/2016  . Low back pain 12/15/2011   Dr. Hoyle Barr  LBP with RLE pain with L5-S1 spondyiolithesis and scoliosis.   . Menopausal state 12/30/2011  . Obesity, Class I, BMI 30-34.9 12/30/2011  . Paroxysmal atrial fibrillation ([place]) 09/10/2016  . Polyp of colon 10/2009  . PONV (postoperative nausea and vomiting)   . Pure hypercholesterolemia 09/10/2016  . Torn rotator cuff 03/13/2019  . Trochanteric bursitis of left hip 09/10/2016  . Vaginal atrophy 12/30/2011     Past Surgical History:  Procedure Laterality Date  . ABDOMINAL HYSTERECTOMY  1999   TAH/BSO  . ANKLE FRACTURE SURGERY Right 1985  . ANKLE HARDWARE REMOVAL Right 1986  . COLONOSCOPY  08/2007   TUBULAR ADENOMAWITH DYSPLASIA  . FOOT FRACTURE SURGERY Left 1986   "piece of bone removed" (08/03/2012)  . FRACTURE SURGERY Bilateral   . HAMMER TOE SURGERY Right 2002  . JOINT REPLACEMENT    . KNEE ARTHROSCOPY Left 2001  . POSTERIOR LUMBAR FUSION  2013   L 5-S1  . RADIOFREQUENCY ABLATION NERVES     6 week ago 07/31/14  . SPINE SURGERY  01/2012  . TOTAL KNEE ARTHROPLASTY Right 07/31/2012   Procedure: TOTAL KNEE ARTHROPLASTY;  Surgeon: Kerin Salen, MD;  Location: [place];  Service: Orthopedics;  Laterality: Right;  DEPUY/SIGMA  . TOTAL KNEE ARTHROPLASTY Left 01/22/2013  . TOTAL KNEE ARTHROPLASTY Left 01/22/2013   Procedure: LEFT TOTAL KNEE ARTHROPLASTY;  Surgeon: Kerin Salen, MD;  Location: [place];  Service: Orthopedics;  Laterality: Left;     Family History  Problem Relation Age of Onset  . Osteoporosis Mother   . Hypertension Mother   . Hypertension Sister   . Hypertension Sister   . Heart disease Father   . Hypertension Father   . Hyperlipidemia  Father   . Cancer Paternal Uncle        PANCREATIC     Social History   Socioeconomic History  . Marital status: Single    Spouse name: Not on file  . Number of children: Not on file  . Years of education: Not on file  . Highest education level: Not on file  Occupational History  . Occupation: retired  Tobacco Use  . Smoking status: Former Smoker    Packs/day: 0.50    Years: 8.00    Pack years: 4.00    Types: Cigarettes    Quit date: 03/15/1972    Years since quitting: 47.7  . Smokeless tobacco: Never Used  Vaping Use  . Vaping Use: Never used  Substance and Sexual Activity  . Alcohol use: Yes    Comment: occ/ rare wine-2 glasses of wine per month  . Drug use: No  . Sexual activity: Never  Other Topics Concern  . Not on file  Social History Narrative   Exercise riding bike at [employer] and walking   Social Determinants of Health   Financial Resource Strain:   . Difficulty of Paying  Living Expenses: Not on file  Food Insecurity:   . Worried About Charity fundraiser in the Last Year: Not on file  . Ran Out of Food in the Last Year: Not on file  Transportation Needs:   . Lack of Transportation (Medical): Not on file  . Lack of Transportation (Non-Medical): Not on file  Physical Activity:   . Days of Exercise per Week: Not on file  . Minutes of Exercise per Session: Not on file  Stress:   . Feeling of Stress : Not on file  Social Connections:   . Frequency of Communication with Friends and Family: Not on file  . Frequency of Social Gatherings with Friends and Family: Not on file  . Attends Religious Services: Not on file  . Active Member of Clubs or Organizations: Not on file  . Attends Archivist Meetings: Not on file  . Marital Status: Not on file  Intimate Partner Violence:   . Fear of Current or Ex-Partner: Not on file  . Emotionally Abused: Not on file  . Physically Abused: Not on file  . Sexually Abused: Not on file     Allergies    Allergen Reactions  . Sulfa Antibiotics Other (See Comments)    Makes tongue feel funny and makes her feel like she will pass  Out.  Last for days.  . Penicillins Itching    Childhood allergy.   . Latex Rash    Had rash on lft side only after back surgery     Prior to Admission medications   Medication Sig Start Date End Date Taking? Authorizing Provider  acetaminophen (TYLENOL) 650 MG CR tablet Take 650 mg by mouth 2 (two) times daily as needed for pain.   Yes [provider]  DULoxetine HCl 60 MG CSDR  08/05/19  Yes [provider]  ELIQUIS 5 MG TABS tablet Take 1 tablet by mouth twice a day 07/06/19  Yes Richardo Priest, MD  ezetimibe (ZETIA) 10 MG tablet Take 1 tablet by mouth daily 08/27/19  Yes Richardo Priest, MD  famotidine (PEPCID) 20 MG tablet Take 20 mg by mouth daily as needed.    Yes [provider]  fluticasone (FLONASE ALLERGY RELIEF) 50 MCG/ACT nasal spray Place 1 spray into both nostrils daily as needed for allergies or rhinitis.   Yes [provider]  gabapentin (NEURONTIN) 300 MG capsule Take 1 capsule by mouth at bedtime  for 3 days, take 1 capsule twice a day for 3 days, take 1 capsule three times a days for 3 days 05/29/19  Yes [provider]  lisinopril (ZESTRIL) 5 MG tablet Take 0.5 tablets (2.5 mg total) by mouth daily. 06/08/19  Yes Richardo Priest, MD  loratadine (CLARITIN) 10 MG tablet Take 10 mg by mouth daily as needed for allergies.   Yes [provider]  methocarbamol (ROBAXIN) 500 MG tablet Take 500 mg by mouth 2 (two) times daily as needed. 05/29/19  Yes [provider]  metoprolol tartrate (LOPRESSOR) 25 MG tablet Take 0.5 tablets (12.5 mg total) by mouth 2 (two) times daily. 08/29/19  Yes Richardo Priest, MD     Depression screen [facility] 2/9 11/27/2019 04/26/2019 11/08/2018 03/14/2018 11/29/2016  Decreased Interest 0 0 0 0 0  Down, Depressed, Hopeless 0 0 0 0 0  PHQ - 2 Score 0 0 0 0 0     Fall  Risk  11/27/2019 04/26/2019 11/08/2018 10/24/2018 03/14/2018  Falls in the past  year? 0 0 1 1 0  Number falls in past yr: 0 0 0 - 0  Injury with Fall? 0 0 0 0 0  Comment - - - - -  Follow up Falls evaluation completed;Education provided - Falls evaluation completed;Education provided;Falls prevention discussed - -      PHYSICAL EXAM: BP (!) 150/82 Comment: not in clinic/ taken from previous visit  Ht 5\' 9"  (1.753 m)   Wt 225 lb (102.1 kg)   BMI 33.23 kg/m    Wt Readings from Last 3 Encounters:  11/27/19 225 lb (102.1 kg)  08/20/19 225 lb 1.9 oz (102.1 kg)  04/26/19 226 lb 9.6 oz (102.8 kg)    Medicare annual wellness visit, subsequent    Education/Counseling provided regarding diet and exercise, prevention of chronic diseases, smoking/tobacco cessation, if applicable, and reviewed "Covered Medicare Preventive Services."

## 2019-12-04 DIAGNOSIS — H2513 Age-related nuclear cataract, bilateral: Secondary | ICD-10-CM | POA: Diagnosis not present

## 2019-12-14 ENCOUNTER — Other Ambulatory Visit: Payer: Self-pay

## 2019-12-14 MED ORDER — ELIQUIS 5 MG PO TABS: 5.0000 mg | ORAL_TABLET | Freq: Two times a day (BID) | ORAL | 1 refills | Status: DC

## 2019-12-14 NOTE — Telephone Encounter (Signed)
Prescription refill request for Eliquis received. Indication:  Atrial Fibrillation Last office visit: 08/2019  [facility] Scr:  1.43  08/2019 Age: 74 Weight: 102.1 kg  Prescription refilled

## 2019-12-17 DIAGNOSIS — M533 Sacrococcygeal disorders, not elsewhere classified: Secondary | ICD-10-CM | POA: Diagnosis not present

## 2019-12-17 DIAGNOSIS — M5459 Other low back pain: Secondary | ICD-10-CM | POA: Diagnosis not present

## 2020-01-25 DIAGNOSIS — M47816 Spondylosis without myelopathy or radiculopathy, lumbar region: Secondary | ICD-10-CM | POA: Diagnosis not present

## 2020-02-13 DIAGNOSIS — M47816 Spondylosis without myelopathy or radiculopathy, lumbar region: Secondary | ICD-10-CM | POA: Diagnosis not present

## 2020-02-29 ENCOUNTER — Other Ambulatory Visit: Payer: Self-pay | Admitting: Family Medicine

## 2020-02-29 DIAGNOSIS — Z1231 Encounter for screening mammogram for malignant neoplasm of breast: Secondary | ICD-10-CM

## 2020-03-04 DIAGNOSIS — I10 Essential (primary) hypertension: Secondary | ICD-10-CM

## 2020-03-04 MED ORDER — LISINOPRIL 5 MG PO TABS: 5.0000 mg | ORAL_TABLET | Freq: Every day | ORAL | 1 refills | Status: DC

## 2020-03-17 DIAGNOSIS — M47816 Spondylosis without myelopathy or radiculopathy, lumbar region: Secondary | ICD-10-CM | POA: Diagnosis not present

## 2020-04-01 DIAGNOSIS — M47816 Spondylosis without myelopathy or radiculopathy, lumbar region: Secondary | ICD-10-CM | POA: Diagnosis not present

## 2020-04-10 ENCOUNTER — Ambulatory Visit
Admission: RE | Admit: 2020-04-10 | Discharge: 2020-04-10 | Disposition: A | Discharge status: Home / Self Care | Payer: PPO | Source: Ambulatory Visit | Attending: Family Medicine | Admitting: Family Medicine

## 2020-04-10 ENCOUNTER — Other Ambulatory Visit: Payer: Self-pay

## 2020-04-10 DIAGNOSIS — Z1231 Encounter for screening mammogram for malignant neoplasm of breast: Secondary | ICD-10-CM

## 2020-04-21 ENCOUNTER — Other Ambulatory Visit: Payer: Self-pay

## 2020-04-21 DIAGNOSIS — I10 Essential (primary) hypertension: Secondary | ICD-10-CM

## 2020-04-21 MED ORDER — EZETIMIBE 10 MG PO TABS: 10.0000 mg | ORAL_TABLET | Freq: Every day | ORAL | 0 refills | Status: DC

## 2020-04-21 MED ORDER — LISINOPRIL 5 MG PO TABS: 5.0000 mg | ORAL_TABLET | Freq: Every day | ORAL | 0 refills | Status: DC

## 2020-04-21 NOTE — Telephone Encounter (Signed)
Refills of Lisinorpil 5 mg and Ezetimibe 10 mg sent to [facility].

## 2020-04-28 ENCOUNTER — Other Ambulatory Visit: Payer: Self-pay

## 2020-04-28 DIAGNOSIS — I10 Essential (primary) hypertension: Secondary | ICD-10-CM

## 2020-04-28 DIAGNOSIS — M47816 Spondylosis without myelopathy or radiculopathy, lumbar region: Secondary | ICD-10-CM | POA: Diagnosis not present

## 2020-04-28 MED ORDER — LISINOPRIL 5 MG PO TABS: 5.0000 mg | ORAL_TABLET | Freq: Every day | ORAL | 1 refills | Status: DC

## 2020-04-28 NOTE — Telephone Encounter (Signed)
Rx refill sent to pharmacy. Patient notified via My Chart message.

## 2020-05-02 ENCOUNTER — Other Ambulatory Visit: Payer: Self-pay

## 2020-05-02 DIAGNOSIS — I10 Essential (primary) hypertension: Secondary | ICD-10-CM

## 2020-05-02 MED ORDER — LISINOPRIL 5 MG PO TABS: 5.0000 mg | ORAL_TABLET | Freq: Every day | ORAL | 1 refills | Status: DC

## 2020-05-02 NOTE — Telephone Encounter (Signed)
Rx for Lisinopril sent to [place] per her request verbally.

## 2020-05-06 ENCOUNTER — Other Ambulatory Visit: Payer: Self-pay | Admitting: Cardiology

## 2020-05-06 DIAGNOSIS — I10 Essential (primary) hypertension: Secondary | ICD-10-CM

## 2020-05-06 MED ORDER — LISINOPRIL 5 MG PO TABS: 5.0000 mg | ORAL_TABLET | Freq: Every day | ORAL | 3 refills | Status: DC

## 2020-05-06 NOTE — Addendum Note (Signed)
Addended by: Resa Miner I on: 05/06/2020 11:49 AM   Modules accepted: Orders

## 2020-05-06 NOTE — Telephone Encounter (Signed)
Refill sent to pharmacy.   

## 2020-05-20 ENCOUNTER — Ambulatory Visit (INDEPENDENT_AMBULATORY_CARE_PROVIDER_SITE_OTHER): Payer: PPO

## 2020-05-20 ENCOUNTER — Other Ambulatory Visit: Payer: Self-pay

## 2020-05-20 ENCOUNTER — Ambulatory Visit: Payer: PPO | Admitting: Podiatry

## 2020-05-20 DIAGNOSIS — M21611 Bunion of right foot: Secondary | ICD-10-CM | POA: Diagnosis not present

## 2020-05-20 DIAGNOSIS — M2011 Hallux valgus (acquired), right foot: Secondary | ICD-10-CM | POA: Diagnosis not present

## 2020-05-20 DIAGNOSIS — M19271 Secondary osteoarthritis, right ankle and foot: Secondary | ICD-10-CM

## 2020-05-20 DIAGNOSIS — Q66221 Congenital metatarsus adductus, right foot: Secondary | ICD-10-CM

## 2020-05-20 DIAGNOSIS — M79671 Pain in right foot: Secondary | ICD-10-CM

## 2020-05-21 ENCOUNTER — Encounter: Payer: Self-pay | Admitting: Podiatry

## 2020-05-21 NOTE — Progress Notes (Signed)
  Subjective:  Patient ID: Katherine Mcpherson, female    DOB: [birth year],  MRN: [id number]  Chief Complaint  Patient presents with  . Foot Pain    Right foot pain, PT stated that the pain is in the arch of her foot and on the top of her foot as well.    75 y.o. female presents with the above complaint. History confirmed with patient.  She is going to [place] in a few months and would like to see if there is anything to help with pain before she goes on her trip and she will be doing a lot of walking.  Objective:  Physical Exam: warm, good capillary refill, no trophic changes or ulcerative lesions, normal DP and PT pulses and normal sensory exam.  Varicose veins present.  Right foot she has hallux valgus with a bunion metatarsal adductus and ambulation to the midfoot over the tarsometatarsal joints and midtarsal joint.   Radiographs: X-ray of the right foot: Pes planus is noted with osteoarthritis at the navicular cuneiform joints and tarsometatarsal joints. Assessment:   1. Right foot pain   2. Other secondary osteoarthritis of right foot   3. Hallux valgus with bunions, right   4. Metatarsus adductus of right foot      Plan:  Patient was evaluated and treated and all questions answered.  Discussed etiology and treatment options in detail for osteoarthritis of the midfoot.  Discussed surgical and nonsurgical treatment including arthrodesis of both bracing and orthotic support.  She currently has been wearing an arch support she got from the complete store.  I think this could be augmented well by placing in her shoe a carbon fiber footplate insert which was dispensed today.  Recommended Voltaren gel, she cannot take oral NSAIDs.  Otherwise take Tylenol.  If the commendation of the carbon fiber insert in the arch support is very helpful for her then we could consider a custom molded foot orthosis semirigid in nature to support her midfoot and reduce motion to help with her pain.  I would  like to see her back in a few weeks before her trip to see how she is doing Return in about 7 weeks (around 07/08/2020).

## 2020-05-27 DIAGNOSIS — H2513 Age-related nuclear cataract, bilateral: Secondary | ICD-10-CM | POA: Diagnosis not present

## 2020-05-29 ENCOUNTER — Encounter: Payer: Self-pay | Admitting: Podiatry

## 2020-06-23 ENCOUNTER — Other Ambulatory Visit: Payer: Self-pay | Admitting: Cardiology

## 2020-06-24 DIAGNOSIS — M4800 Spinal stenosis, site unspecified: Secondary | ICD-10-CM | POA: Diagnosis not present

## 2020-06-24 DIAGNOSIS — M4186 Other forms of scoliosis, lumbar region: Secondary | ICD-10-CM | POA: Diagnosis not present

## 2020-06-24 DIAGNOSIS — Z981 Arthrodesis status: Secondary | ICD-10-CM | POA: Diagnosis not present

## 2020-06-24 DIAGNOSIS — M4180 Other forms of scoliosis, site unspecified: Secondary | ICD-10-CM | POA: Diagnosis not present

## 2020-06-24 DIAGNOSIS — M4185 Other forms of scoliosis, thoracolumbar region: Secondary | ICD-10-CM | POA: Diagnosis not present

## 2020-06-24 DIAGNOSIS — M4184 Other forms of scoliosis, thoracic region: Secondary | ICD-10-CM | POA: Diagnosis not present

## 2020-06-24 MED ORDER — ELIQUIS 5 MG PO TABS: 5.0000 mg | ORAL_TABLET | Freq: Two times a day (BID) | ORAL | 3 refills | Status: DC

## 2020-06-24 NOTE — Telephone Encounter (Signed)
63f, 102.1kg, scr 1.43 08/20/19, lovw/munley 08/20/19

## 2020-07-08 ENCOUNTER — Ambulatory Visit: Payer: PPO | Admitting: Podiatry

## 2020-07-08 ENCOUNTER — Encounter: Payer: Self-pay | Admitting: Podiatry

## 2020-07-08 ENCOUNTER — Other Ambulatory Visit: Payer: Self-pay

## 2020-07-08 DIAGNOSIS — M19271 Secondary osteoarthritis, right ankle and foot: Secondary | ICD-10-CM | POA: Diagnosis not present

## 2020-07-08 DIAGNOSIS — M2011 Hallux valgus (acquired), right foot: Secondary | ICD-10-CM

## 2020-07-08 DIAGNOSIS — M21611 Bunion of right foot: Secondary | ICD-10-CM

## 2020-07-08 DIAGNOSIS — Q66221 Congenital metatarsus adductus, right foot: Secondary | ICD-10-CM

## 2020-07-08 NOTE — Progress Notes (Signed)
  Subjective:  Patient ID: Katherine Mcpherson, female    DOB: 28-Feb-1946,  MRN: [id number]  Chief Complaint  Patient presents with  . Foot Pain    7 week f/u   painful arch,top of foot-right foot    75 y.o. female presents with the above complaint. History confirmed with patient.  Foot pain and is somewhat better right now because she is on prednisone for her back.  Has not changed a whole lot.  The carbon fiber plate has not made much difference  Objective:  Physical Exam: warm, good capillary refill, no trophic changes or ulcerative lesions, normal DP and PT pulses and normal sensory exam.  Varicose veins present.  Right foot she has hallux valgus with a bunion metatarsal adductus and ambulation to the midfoot over the tarsometatarsal joints and midtarsal joint.   Radiographs: X-ray of the right foot: Pes planus is noted with osteoarthritis at the navicular cuneiform joints and tarsometatarsal joints. Assessment:   1. Other secondary osteoarthritis of right foot   2. Hallux valgus with bunions, right   3. Metatarsus adductus of right foot      Plan:  Patient was evaluated and treated and all questions answered.  We can discuss further treatment options for midfoot osteoarthritis.  She is currently taking prednisone taper for her back and is likely looking at cataract surgery and back surgery in the near future.  She is another steroid pack pending for her trip next week to [place].  This should give her some pain relief while she is there in the event that she needs it.  I think this will take care of most of her foot pain as well.  We discussed further treatment with custom molded orthoses with a semirigid orthotic with cushioning to alleviate pressure and pain in the foot.  Furthermore we discussed eventual arthrodesis if necessary but would prefer to try custom orthotics prior to this.  She will return as needed  Return if symptoms worsen or fail to improve.

## 2020-07-11 DIAGNOSIS — Z981 Arthrodesis status: Secondary | ICD-10-CM | POA: Diagnosis not present

## 2020-07-11 DIAGNOSIS — M5136 Other intervertebral disc degeneration, lumbar region: Secondary | ICD-10-CM | POA: Diagnosis not present

## 2020-07-11 DIAGNOSIS — M4180 Other forms of scoliosis, site unspecified: Secondary | ICD-10-CM | POA: Diagnosis not present

## 2020-08-18 DIAGNOSIS — R112 Nausea with vomiting, unspecified: Secondary | ICD-10-CM | POA: Insufficient documentation

## 2020-08-18 DIAGNOSIS — Z8719 Personal history of other diseases of the digestive system: Secondary | ICD-10-CM | POA: Insufficient documentation

## 2020-08-18 DIAGNOSIS — G8929 Other chronic pain: Secondary | ICD-10-CM | POA: Insufficient documentation

## 2020-08-18 DIAGNOSIS — D649 Anemia, unspecified: Secondary | ICD-10-CM | POA: Insufficient documentation

## 2020-08-18 DIAGNOSIS — I10 Essential (primary) hypertension: Secondary | ICD-10-CM | POA: Insufficient documentation

## 2020-08-18 DIAGNOSIS — K219 Gastro-esophageal reflux disease without esophagitis: Secondary | ICD-10-CM | POA: Insufficient documentation

## 2020-08-18 DIAGNOSIS — M1990 Unspecified osteoarthritis, unspecified site: Secondary | ICD-10-CM | POA: Insufficient documentation

## 2020-08-18 DIAGNOSIS — T7840XA Allergy, unspecified, initial encounter: Secondary | ICD-10-CM | POA: Insufficient documentation

## 2020-08-18 DIAGNOSIS — Z9889 Other specified postprocedural states: Secondary | ICD-10-CM | POA: Insufficient documentation

## 2020-08-18 DIAGNOSIS — E7800 Pure hypercholesterolemia, unspecified: Secondary | ICD-10-CM | POA: Insufficient documentation

## 2020-08-18 DIAGNOSIS — M545 Low back pain, unspecified: Secondary | ICD-10-CM | POA: Insufficient documentation

## 2020-08-20 DIAGNOSIS — M549 Dorsalgia, unspecified: Secondary | ICD-10-CM | POA: Diagnosis not present

## 2020-08-20 DIAGNOSIS — M4186 Other forms of scoliosis, lumbar region: Secondary | ICD-10-CM | POA: Diagnosis not present

## 2020-08-20 DIAGNOSIS — M48061 Spinal stenosis, lumbar region without neurogenic claudication: Secondary | ICD-10-CM | POA: Diagnosis not present

## 2020-08-20 DIAGNOSIS — Z981 Arthrodesis status: Secondary | ICD-10-CM | POA: Diagnosis not present

## 2020-08-21 NOTE — Progress Notes (Signed)
Cardiology Office Note:    Date:  08/22/2020   ID:  Katherine Mcpherson, DOB 1945/09/13, MRN [id number]  PCP:  Jacelyn Pi, Lilia Argue, MD  Cardiologist:  Shirlee More, MD    Referring MD: Jacelyn Pi, Lilia Argue, *    ASSESSMENT:    1. Paroxysmal atrial fibrillation (HCC)   2. Long term current use of anticoagulant therapy   3. Hypertensive heart disease without heart failure   4. Benign hypertensive heart and CKD, stage 3 (GFR 30-59), w CHF ([place])   5. Essential hypertension    PLAN:    In order of problems listed above:  Atrial fibrillation is stable maintaining sinus rhythm continue her low-dose beta-blocker and anticoagulant.  For surgery she will need to withdraw 3 full days prior to her anticipated spine surgery resumption at the discretion of her surgeon she does not need bridging anticoagulation. BP at target continue current treatment ACE inhibitor We will recheck her renal function potassium today   Next appointment: 1 year   Medication Adjustments/Labs and Tests Ordered: Current medicines are reviewed at length with the patient today.  Concerns regarding medicines are outlined above.  Orders Placed This Encounter  Procedures   Comprehensive metabolic panel   Lipid panel   EKG 12-Lead    Meds ordered this encounter  Medications   lisinopril (ZESTRIL) 5 MG tablet    Sig: Take 1 tablet (5 mg total) by mouth daily.    Dispense:  90 tablet    Refill:  3    Refill [id number]     Chief Complaint  Patient presents with   Follow-up   Aortic Insuffiency   Anticoagulation    History of Present Illness:    Katherine Mcpherson is a 75 y.o. female with a hx of paroxysmal atrial fibrillation anticoagulation and hypertensive heart disease without heart failure with stage III CKD and hyperlipidemia last seen 08/20/2019. Compliance with diet, lifestyle and medications: Yes  She has done well except while traveling to [place] she had 1 brief episode of rapid heart rhythm  took an extra dose beta-blocker and quickly resolved. She tolerates her anticoagulant without bleeding No chest pain edema shortness of breath or syncope. She has scoliosis anticipates surgery T10 S1 fusion [facility] Dr. Harl Bowie. She is optimized for the planned surgery she will need to be off her anticoagulant 3 full days prior to surgery and resumed at the discretion of her surgeon. Postoperative place her in a monitored bed and check EKG postoperative day 1 continue her other cardiac medications She is overdue for labs we will check a lipid profile and CMP today Past Medical History:  Diagnosis Date   Allergy    Anemia    "comes and goes" (01/22/2013)   Aortic regurgitation 07/21/2018   Arthritis    "qwhere" (01/22/2013)   Chronic lower back pain    "ongoing since back OR 2013" (01/22/2013)   Fall from slip, trip, or stumble 01/11/2013   "stubbed toe at gas pump at [place]" (01/22/2013)   Gastroesophageal reflux disease 08/30/2016   Seen by Dr. [name] 08/17/2016   GERD (gastroesophageal reflux disease)    H/O hiatal hernia    High cholesterol    Hypertension    Hypertensive heart disease 12/30/2011   Long term current use of anticoagulant therapy 09/10/2016   Low back pain 12/15/2011   Dr. Hoyle Barr  LBP with RLE pain with L5-S1 spondyiolithesis and scoliosis.    Menopausal state 12/30/2011   Obesity,  Class I, BMI 30-34.9 12/30/2011   Paroxysmal atrial fibrillation ([place]) 09/10/2016   Polyp of colon 10/2009   PONV (postoperative nausea and vomiting)    Pure hypercholesterolemia 09/10/2016   Torn rotator cuff 03/13/2019   Trochanteric bursitis of left hip 09/10/2016   Vaginal atrophy 12/30/2011    Past Surgical History:  Procedure Laterality Date   ABDOMINAL HYSTERECTOMY  1999   TAH/BSO   ANKLE FRACTURE SURGERY Right 1985   ANKLE HARDWARE REMOVAL Right 1986   COLONOSCOPY  08/2007   TUBULAR ADENOMAWITH DYSPLASIA   FOOT FRACTURE SURGERY Left 1986   "piece of bone  removed" (08/03/2012)   FRACTURE SURGERY Bilateral    HAMMER TOE SURGERY Right 2002   JOINT REPLACEMENT     KNEE ARTHROSCOPY Left 2001   POSTERIOR LUMBAR FUSION  2013   L 5-S1   RADIOFREQUENCY ABLATION NERVES     6 week ago 07/31/14   SPINE SURGERY  01/2012   TOTAL KNEE ARTHROPLASTY Right 07/31/2012   Procedure: TOTAL KNEE ARTHROPLASTY;  Surgeon: Kerin Salen, MD;  Location: [place];  Service: Orthopedics;  Laterality: Right;  DEPUY/SIGMA   TOTAL KNEE ARTHROPLASTY Left 01/22/2013   TOTAL KNEE ARTHROPLASTY Left 01/22/2013   Procedure: LEFT TOTAL KNEE ARTHROPLASTY;  Surgeon: Kerin Salen, MD;  Location: [place];  Service: Orthopedics;  Laterality: Left;    Current Medications: Current Meds  Medication Sig   acetaminophen (TYLENOL) 650 MG CR tablet Take 650 mg by mouth 2 (two) times daily as needed for pain.   carisoprodol (SOMA) 350 MG tablet Take 350 mg by mouth 3 (three) times daily as needed for muscle spasms.   ELIQUIS 5 MG TABS tablet Take 1 tablet by mouth twice a day   ezetimibe (ZETIA) 10 MG tablet Take 1 tablet (10 mg total) by mouth daily.   famotidine (PEPCID) 20 MG tablet Take 20 mg by mouth daily as needed for heartburn.   fluticasone (FLONASE) 50 MCG/ACT nasal spray Place 1 spray into both nostrils daily as needed for allergies or rhinitis.   loratadine (CLARITIN) 10 MG tablet Take 10 mg by mouth daily as needed for allergies.   metoprolol tartrate (LOPRESSOR) 25 MG tablet Take 0.5 tablets (12.5 mg total) by mouth 2 (two) times daily.   [DISCONTINUED] lisinopril (ZESTRIL) 5 MG tablet Take 1 tablet (5 mg total) by mouth daily.     Allergies:   Sulfa antibiotics, Penicillins, and Latex   Social History   Socioeconomic History   Marital status: Single    Spouse name: Not on file   Number of children: Not on file   Years of education: Not on file   Highest education level: Not on file  Occupational History   Occupation: retired  Tobacco Use   Smoking status: Former     Packs/day: 0.50    Years: 8.00    Pack years: 4.00    Types: Cigarettes    Quit date: 03/15/1972    Years since quitting: 48.4   Smokeless tobacco: Never  Vaping Use   Vaping Use: Never used  Substance and Sexual Activity   Alcohol use: Yes    Comment: occ/ rare wine-2 glasses of wine per month   Drug use: No   Sexual activity: Never  Other Topics Concern   Not on file  Social History Narrative   Exercise riding bike at [employer] and walking   Social Determinants of Health   Financial Resource Strain: Not on file  Food Insecurity: Not  on file  Transportation Needs: Not on file  Physical Activity: Not on file  Stress: Not on file  Social Connections: Not on file     Family History: The patient's family history includes Cancer in her paternal uncle; Heart disease in her father; Hyperlipidemia in her father; Hypertension in her father, mother, sister, and sister; Osteoporosis in her mother. ROS:   Please see the history of present illness.    All other systems reviewed and are negative.  EKGs/Labs/Other Studies Reviewed:    The following studies were reviewed today:  EKG:  EKG ordered today and personally reviewed.  The ekg ordered today demonstrates sinus bradycardia 51 bpm first-degree AV block otherwise normal  Recent Labs: No results found for requested labs within last 8760 hours.  Recent Lipid Panel    Component Value Date/Time   CHOL 209 (H) 08/20/2019 1115   TRIG 112 08/20/2019 1115   HDL 62 08/20/2019 1115   CHOLHDL 3.4 08/20/2019 1115   CHOLHDL 3.0 08/25/2015 0853   VLDL 15 08/25/2015 0853   LDLCALC 127 (H) 08/20/2019 1115    Physical Exam:    VS:  BP 140/90 (BP Location: Right Arm, Patient Position: Sitting, Cuff Size: Normal)   Pulse (!) 51   Ht 5\' 9"  (1.753 m)   Wt 226 lb (102.5 kg)   SpO2 97%   BMI 33.37 kg/m     Wt Readings from Last 3 Encounters:  08/22/20 226 lb (102.5 kg)  11/27/19 225 lb (102.1 kg)  08/20/19 225 lb 1.9 oz (102.1  kg)     GEN:  Well nourished, well developed in no acute distress HEENT: Normal NECK: No JVD; No carotid bruits LYMPHATICS: No lymphadenopathy CARDIAC: RRR, no murmurs, rubs, gallops RESPIRATORY:  Clear to auscultation without rales, wheezing or rhonchi  ABDOMEN: Soft, non-tender, non-distended MUSCULOSKELETAL:  No edema; No deformity  SKIN: Warm and dry NEUROLOGIC:  Alert and oriented x 3 PSYCHIATRIC:  Normal affect    Signed, Shirlee More, MD  08/22/2020 8:38 AM    [place]

## 2020-08-22 ENCOUNTER — Ambulatory Visit: Payer: PPO | Admitting: Cardiology

## 2020-08-22 ENCOUNTER — Encounter: Payer: Self-pay | Admitting: Cardiology

## 2020-08-22 ENCOUNTER — Other Ambulatory Visit: Payer: Self-pay

## 2020-08-22 VITALS — BP 140/90 | HR 51 | Ht 69.0 in | Wt 226.0 lb

## 2020-08-22 DIAGNOSIS — Z7901 Long term (current) use of anticoagulants: Secondary | ICD-10-CM

## 2020-08-22 DIAGNOSIS — N183 Chronic kidney disease, stage 3 unspecified: Secondary | ICD-10-CM

## 2020-08-22 DIAGNOSIS — I10 Essential (primary) hypertension: Secondary | ICD-10-CM | POA: Diagnosis not present

## 2020-08-22 DIAGNOSIS — I480 Paroxysmal atrial fibrillation: Secondary | ICD-10-CM | POA: Diagnosis not present

## 2020-08-22 DIAGNOSIS — I130 Hypertensive heart and chronic kidney disease with heart failure and stage 1 through stage 4 chronic kidney disease, or unspecified chronic kidney disease: Secondary | ICD-10-CM

## 2020-08-22 DIAGNOSIS — I119 Hypertensive heart disease without heart failure: Secondary | ICD-10-CM

## 2020-08-22 MED ORDER — LISINOPRIL 5 MG PO TABS: 5.0000 mg | ORAL_TABLET | Freq: Every day | ORAL | 3 refills | Status: DC

## 2020-08-22 NOTE — Patient Instructions (Signed)

## 2020-08-23 LAB — COMPREHENSIVE METABOLIC PANEL
ALT: 14 IU/L (ref 0–32)
AST: 15 IU/L (ref 0–40)
Albumin/Globulin Ratio: 1.7 (ref 1.2–2.2)
Albumin: 4 g/dL (ref 3.7–4.7)
Alkaline Phosphatase: 64 IU/L (ref 44–121)
BUN/Creatinine Ratio: 32 — ABNORMAL HIGH (ref 12–28)
BUN: 44 mg/dL — ABNORMAL HIGH (ref 8–27)
Bilirubin Total: 0.2 mg/dL (ref 0.0–1.2)
CO2: 22 mmol/L (ref 20–29)
Calcium: 9.3 mg/dL (ref 8.7–10.3)
Chloride: 104 mmol/L (ref 96–106)
Creatinine, Ser: 1.39 mg/dL — ABNORMAL HIGH (ref 0.57–1.00)
Globulin, Total: 2.4 g/dL (ref 1.5–4.5)
Glucose: 93 mg/dL (ref 65–99)
Potassium: 5.1 mmol/L (ref 3.5–5.2)
Sodium: 139 mmol/L (ref 134–144)
Total Protein: 6.4 g/dL (ref 6.0–8.5)
eGFR: 39 mL/min/{1.73_m2} — ABNORMAL LOW (ref >59)

## 2020-08-23 LAB — LIPID PANEL
Chol/HDL Ratio: 3.2 ratio (ref 0.0–4.4)
Cholesterol, Total: 231 mg/dL — ABNORMAL HIGH (ref 100–199)
HDL: 73 mg/dL (ref >39)
LDL Chol Calc (NIH): 141 mg/dL — ABNORMAL HIGH (ref 0–99)
Triglycerides: 96 mg/dL (ref 0–149)
VLDL Cholesterol Cal: 17 mg/dL (ref 5–40)

## 2020-08-30 ENCOUNTER — Encounter: Payer: Self-pay | Admitting: Podiatry

## 2020-09-03 MED ORDER — METHYLPREDNISOLONE 4 MG PO TBPK: ORAL_TABLET | ORAL | 0 refills | Status: DC

## 2020-09-04 MED ORDER — METOPROLOL TARTRATE 25 MG PO TABS: 12.5000 mg | ORAL_TABLET | Freq: Two times a day (BID) | ORAL | 3 refills | Status: DC

## 2020-09-04 NOTE — Addendum Note (Signed)
Addended by: Resa Miner I on: 09/04/2020 10:31 AM   Modules accepted: Orders

## 2020-09-05 DIAGNOSIS — H2513 Age-related nuclear cataract, bilateral: Secondary | ICD-10-CM | POA: Diagnosis not present

## 2020-09-11 DIAGNOSIS — Z7901 Long term (current) use of anticoagulants: Secondary | ICD-10-CM | POA: Diagnosis not present

## 2020-09-11 DIAGNOSIS — H25813 Combined forms of age-related cataract, bilateral: Secondary | ICD-10-CM | POA: Diagnosis not present

## 2020-09-11 DIAGNOSIS — H25812 Combined forms of age-related cataract, left eye: Secondary | ICD-10-CM | POA: Diagnosis not present

## 2020-09-12 DIAGNOSIS — Z48810 Encounter for surgical aftercare following surgery on the sense organs: Secondary | ICD-10-CM | POA: Diagnosis not present

## 2020-09-12 DIAGNOSIS — H2511 Age-related nuclear cataract, right eye: Secondary | ICD-10-CM | POA: Diagnosis not present

## 2020-09-12 DIAGNOSIS — Z961 Presence of intraocular lens: Secondary | ICD-10-CM | POA: Diagnosis not present

## 2020-09-25 DIAGNOSIS — Z961 Presence of intraocular lens: Secondary | ICD-10-CM | POA: Diagnosis not present

## 2020-09-25 DIAGNOSIS — I110 Hypertensive heart disease with heart failure: Secondary | ICD-10-CM | POA: Diagnosis not present

## 2020-09-25 DIAGNOSIS — Z87891 Personal history of nicotine dependence: Secondary | ICD-10-CM | POA: Diagnosis not present

## 2020-09-25 DIAGNOSIS — N183 Chronic kidney disease, stage 3 unspecified: Secondary | ICD-10-CM | POA: Diagnosis not present

## 2020-09-25 DIAGNOSIS — H25811 Combined forms of age-related cataract, right eye: Secondary | ICD-10-CM | POA: Diagnosis not present

## 2020-09-25 DIAGNOSIS — K219 Gastro-esophageal reflux disease without esophagitis: Secondary | ICD-10-CM | POA: Diagnosis not present

## 2020-09-25 DIAGNOSIS — I129 Hypertensive chronic kidney disease with stage 1 through stage 4 chronic kidney disease, or unspecified chronic kidney disease: Secondary | ICD-10-CM | POA: Diagnosis not present

## 2020-09-25 DIAGNOSIS — K449 Diaphragmatic hernia without obstruction or gangrene: Secondary | ICD-10-CM | POA: Diagnosis not present

## 2020-09-25 DIAGNOSIS — I4891 Unspecified atrial fibrillation: Secondary | ICD-10-CM | POA: Diagnosis not present

## 2020-09-25 DIAGNOSIS — Z7901 Long term (current) use of anticoagulants: Secondary | ICD-10-CM | POA: Diagnosis not present

## 2020-09-25 DIAGNOSIS — H2511 Age-related nuclear cataract, right eye: Secondary | ICD-10-CM | POA: Diagnosis not present

## 2020-09-26 DIAGNOSIS — Z9841 Cataract extraction status, right eye: Secondary | ICD-10-CM | POA: Diagnosis not present

## 2020-09-26 DIAGNOSIS — Z48810 Encounter for surgical aftercare following surgery on the sense organs: Secondary | ICD-10-CM | POA: Diagnosis not present

## 2020-09-26 DIAGNOSIS — Z9842 Cataract extraction status, left eye: Secondary | ICD-10-CM | POA: Diagnosis not present

## 2020-09-26 DIAGNOSIS — Z961 Presence of intraocular lens: Secondary | ICD-10-CM | POA: Diagnosis not present

## 2020-10-10 ENCOUNTER — Telehealth: Payer: Self-pay | Admitting: Cardiology

## 2020-10-10 DIAGNOSIS — Z7952 Long term (current) use of systemic steroids: Secondary | ICD-10-CM | POA: Diagnosis not present

## 2020-10-10 DIAGNOSIS — E875 Hyperkalemia: Secondary | ICD-10-CM | POA: Diagnosis not present

## 2020-10-10 DIAGNOSIS — I509 Heart failure, unspecified: Secondary | ICD-10-CM | POA: Diagnosis not present

## 2020-10-10 DIAGNOSIS — M4804 Spinal stenosis, thoracic region: Secondary | ICD-10-CM | POA: Diagnosis not present

## 2020-10-10 DIAGNOSIS — Z823 Family history of stroke: Secondary | ICD-10-CM | POA: Diagnosis not present

## 2020-10-10 DIAGNOSIS — I130 Hypertensive heart and chronic kidney disease with heart failure and stage 1 through stage 4 chronic kidney disease, or unspecified chronic kidney disease: Secondary | ICD-10-CM | POA: Diagnosis not present

## 2020-10-10 DIAGNOSIS — D62 Acute posthemorrhagic anemia: Secondary | ICD-10-CM | POA: Diagnosis not present

## 2020-10-10 DIAGNOSIS — Z961 Presence of intraocular lens: Secondary | ICD-10-CM | POA: Diagnosis not present

## 2020-10-10 DIAGNOSIS — E669 Obesity, unspecified: Secondary | ICD-10-CM | POA: Diagnosis not present

## 2020-10-10 DIAGNOSIS — D649 Anemia, unspecified: Secondary | ICD-10-CM | POA: Diagnosis not present

## 2020-10-10 DIAGNOSIS — Z8249 Family history of ischemic heart disease and other diseases of the circulatory system: Secondary | ICD-10-CM | POA: Diagnosis not present

## 2020-10-10 DIAGNOSIS — Z820 Family history of epilepsy and other diseases of the nervous system: Secondary | ICD-10-CM | POA: Diagnosis not present

## 2020-10-10 DIAGNOSIS — Z6830 Body mass index (BMI) 30.0-30.9, adult: Secondary | ICD-10-CM | POA: Diagnosis not present

## 2020-10-10 DIAGNOSIS — Z8719 Personal history of other diseases of the digestive system: Secondary | ICD-10-CM | POA: Diagnosis not present

## 2020-10-10 DIAGNOSIS — N183 Chronic kidney disease, stage 3 unspecified: Secondary | ICD-10-CM | POA: Diagnosis not present

## 2020-10-10 DIAGNOSIS — M4186 Other forms of scoliosis, lumbar region: Secondary | ICD-10-CM | POA: Diagnosis not present

## 2020-10-10 DIAGNOSIS — Z48810 Encounter for surgical aftercare following surgery on the sense organs: Secondary | ICD-10-CM | POA: Diagnosis not present

## 2020-10-10 DIAGNOSIS — M48062 Spinal stenosis, lumbar region with neurogenic claudication: Secondary | ICD-10-CM | POA: Diagnosis not present

## 2020-10-10 NOTE — Telephone Encounter (Signed)
Dr. Julaine Hua, anesthesiologist, calling from [facility] to discuss patient's elevated potassium. She states the patient is scheduled for her back surgery next week on Thursday and is not sure if Dr. Bettina Gavia would like her to have it rechecked at the office Monday or Tuesday. Phone: [phone]

## 2020-10-13 DIAGNOSIS — Z981 Arthrodesis status: Secondary | ICD-10-CM | POA: Diagnosis not present

## 2020-10-13 DIAGNOSIS — M4804 Spinal stenosis, thoracic region: Secondary | ICD-10-CM | POA: Diagnosis not present

## 2020-10-13 DIAGNOSIS — N183 Chronic kidney disease, stage 3 unspecified: Secondary | ICD-10-CM | POA: Diagnosis not present

## 2020-10-13 DIAGNOSIS — Z820 Family history of epilepsy and other diseases of the nervous system: Secondary | ICD-10-CM | POA: Diagnosis not present

## 2020-10-13 DIAGNOSIS — M4187 Other forms of scoliosis, lumbosacral region: Secondary | ICD-10-CM | POA: Diagnosis not present

## 2020-10-13 DIAGNOSIS — E875 Hyperkalemia: Secondary | ICD-10-CM | POA: Diagnosis not present

## 2020-10-13 DIAGNOSIS — M48061 Spinal stenosis, lumbar region without neurogenic claudication: Secondary | ICD-10-CM | POA: Diagnosis not present

## 2020-10-13 DIAGNOSIS — Z6830 Body mass index (BMI) 30.0-30.9, adult: Secondary | ICD-10-CM | POA: Diagnosis not present

## 2020-10-13 DIAGNOSIS — Z8249 Family history of ischemic heart disease and other diseases of the circulatory system: Secondary | ICD-10-CM | POA: Diagnosis not present

## 2020-10-13 DIAGNOSIS — Z8719 Personal history of other diseases of the digestive system: Secondary | ICD-10-CM | POA: Diagnosis not present

## 2020-10-13 DIAGNOSIS — D649 Anemia, unspecified: Secondary | ICD-10-CM | POA: Diagnosis not present

## 2020-10-13 DIAGNOSIS — E669 Obesity, unspecified: Secondary | ICD-10-CM | POA: Diagnosis not present

## 2020-10-13 DIAGNOSIS — M4186 Other forms of scoliosis, lumbar region: Secondary | ICD-10-CM | POA: Diagnosis not present

## 2020-10-13 DIAGNOSIS — I509 Heart failure, unspecified: Secondary | ICD-10-CM | POA: Diagnosis not present

## 2020-10-13 DIAGNOSIS — Z823 Family history of stroke: Secondary | ICD-10-CM | POA: Diagnosis not present

## 2020-10-13 DIAGNOSIS — I130 Hypertensive heart and chronic kidney disease with heart failure and stage 1 through stage 4 chronic kidney disease, or unspecified chronic kidney disease: Secondary | ICD-10-CM | POA: Diagnosis not present

## 2020-10-13 DIAGNOSIS — M4316 Spondylolisthesis, lumbar region: Secondary | ICD-10-CM | POA: Diagnosis not present

## 2020-10-13 DIAGNOSIS — D62 Acute posthemorrhagic anemia: Secondary | ICD-10-CM | POA: Diagnosis not present

## 2020-10-13 DIAGNOSIS — M4185 Other forms of scoliosis, thoracolumbar region: Secondary | ICD-10-CM | POA: Diagnosis not present

## 2020-10-13 DIAGNOSIS — M48062 Spinal stenosis, lumbar region with neurogenic claudication: Secondary | ICD-10-CM | POA: Diagnosis not present

## 2020-10-16 DIAGNOSIS — M4187 Other forms of scoliosis, lumbosacral region: Secondary | ICD-10-CM | POA: Diagnosis not present

## 2020-10-16 DIAGNOSIS — M4185 Other forms of scoliosis, thoracolumbar region: Secondary | ICD-10-CM | POA: Diagnosis not present

## 2020-10-16 DIAGNOSIS — M48062 Spinal stenosis, lumbar region with neurogenic claudication: Secondary | ICD-10-CM | POA: Diagnosis not present

## 2020-10-16 DIAGNOSIS — M4186 Other forms of scoliosis, lumbar region: Secondary | ICD-10-CM | POA: Diagnosis not present

## 2020-10-23 DIAGNOSIS — M4180 Other forms of scoliosis, site unspecified: Secondary | ICD-10-CM | POA: Diagnosis not present

## 2020-10-23 DIAGNOSIS — M6281 Muscle weakness (generalized): Secondary | ICD-10-CM | POA: Diagnosis not present

## 2020-10-23 DIAGNOSIS — R262 Difficulty in walking, not elsewhere classified: Secondary | ICD-10-CM | POA: Diagnosis not present

## 2020-10-23 DIAGNOSIS — M4327 Fusion of spine, lumbosacral region: Secondary | ICD-10-CM | POA: Diagnosis not present

## 2020-10-23 DIAGNOSIS — M48062 Spinal stenosis, lumbar region with neurogenic claudication: Secondary | ICD-10-CM | POA: Diagnosis not present

## 2020-10-23 DIAGNOSIS — Z981 Arthrodesis status: Secondary | ICD-10-CM | POA: Diagnosis not present

## 2020-10-23 DIAGNOSIS — Z4889 Encounter for other specified surgical aftercare: Secondary | ICD-10-CM | POA: Diagnosis not present

## 2020-10-23 DIAGNOSIS — I10 Essential (primary) hypertension: Secondary | ICD-10-CM | POA: Diagnosis not present

## 2020-10-25 DIAGNOSIS — Z4889 Encounter for other specified surgical aftercare: Secondary | ICD-10-CM | POA: Diagnosis not present

## 2020-10-25 DIAGNOSIS — M48062 Spinal stenosis, lumbar region with neurogenic claudication: Secondary | ICD-10-CM | POA: Diagnosis not present

## 2020-10-25 DIAGNOSIS — R262 Difficulty in walking, not elsewhere classified: Secondary | ICD-10-CM | POA: Diagnosis not present

## 2020-10-25 DIAGNOSIS — M6281 Muscle weakness (generalized): Secondary | ICD-10-CM | POA: Diagnosis not present

## 2020-10-25 DIAGNOSIS — M4327 Fusion of spine, lumbosacral region: Secondary | ICD-10-CM | POA: Diagnosis not present

## 2020-10-25 DIAGNOSIS — I10 Essential (primary) hypertension: Secondary | ICD-10-CM | POA: Diagnosis not present

## 2020-10-25 DIAGNOSIS — M4180 Other forms of scoliosis, site unspecified: Secondary | ICD-10-CM | POA: Diagnosis not present

## 2020-10-25 DIAGNOSIS — Z981 Arthrodesis status: Secondary | ICD-10-CM | POA: Diagnosis not present

## 2020-10-31 DIAGNOSIS — Z981 Arthrodesis status: Secondary | ICD-10-CM | POA: Diagnosis not present

## 2020-10-31 DIAGNOSIS — Z4789 Encounter for other orthopedic aftercare: Secondary | ICD-10-CM | POA: Diagnosis not present

## 2020-10-31 DIAGNOSIS — Z9889 Other specified postprocedural states: Secondary | ICD-10-CM | POA: Diagnosis not present

## 2020-11-05 DIAGNOSIS — E785 Hyperlipidemia, unspecified: Secondary | ICD-10-CM | POA: Diagnosis not present

## 2020-11-05 DIAGNOSIS — I129 Hypertensive chronic kidney disease with stage 1 through stage 4 chronic kidney disease, or unspecified chronic kidney disease: Secondary | ICD-10-CM | POA: Diagnosis not present

## 2020-11-05 DIAGNOSIS — N179 Acute kidney failure, unspecified: Secondary | ICD-10-CM | POA: Diagnosis not present

## 2020-11-05 DIAGNOSIS — N189 Chronic kidney disease, unspecified: Secondary | ICD-10-CM | POA: Diagnosis not present

## 2020-11-05 DIAGNOSIS — Z96659 Presence of unspecified artificial knee joint: Secondary | ICD-10-CM | POA: Diagnosis not present

## 2020-11-05 DIAGNOSIS — R0981 Nasal congestion: Secondary | ICD-10-CM | POA: Diagnosis not present

## 2020-11-05 DIAGNOSIS — R531 Weakness: Secondary | ICD-10-CM | POA: Diagnosis not present

## 2020-11-05 DIAGNOSIS — R2689 Other abnormalities of gait and mobility: Secondary | ICD-10-CM | POA: Diagnosis not present

## 2020-11-05 DIAGNOSIS — D649 Anemia, unspecified: Secondary | ICD-10-CM | POA: Diagnosis not present

## 2020-11-05 DIAGNOSIS — L03115 Cellulitis of right lower limb: Secondary | ICD-10-CM | POA: Diagnosis not present

## 2020-11-05 DIAGNOSIS — Z90710 Acquired absence of both cervix and uterus: Secondary | ICD-10-CM | POA: Diagnosis not present

## 2020-11-05 DIAGNOSIS — Z79899 Other long term (current) drug therapy: Secondary | ICD-10-CM | POA: Diagnosis not present

## 2020-11-05 DIAGNOSIS — L539 Erythematous condition, unspecified: Secondary | ICD-10-CM | POA: Diagnosis not present

## 2020-11-05 DIAGNOSIS — Z882 Allergy status to sulfonamides status: Secondary | ICD-10-CM | POA: Diagnosis not present

## 2020-11-05 DIAGNOSIS — I959 Hypotension, unspecified: Secondary | ICD-10-CM | POA: Diagnosis not present

## 2020-11-05 DIAGNOSIS — D75839 Thrombocytosis, unspecified: Secondary | ICD-10-CM | POA: Diagnosis not present

## 2020-11-05 DIAGNOSIS — R600 Localized edema: Secondary | ICD-10-CM | POA: Diagnosis not present

## 2020-11-05 DIAGNOSIS — A419 Sepsis, unspecified organism: Secondary | ICD-10-CM | POA: Diagnosis not present

## 2020-11-05 DIAGNOSIS — M6258 Muscle wasting and atrophy, not elsewhere classified, other site: Secondary | ICD-10-CM | POA: Diagnosis not present

## 2020-11-05 DIAGNOSIS — R55 Syncope and collapse: Secondary | ICD-10-CM | POA: Diagnosis not present

## 2020-11-05 DIAGNOSIS — R5381 Other malaise: Secondary | ICD-10-CM | POA: Diagnosis not present

## 2020-11-05 DIAGNOSIS — I509 Heart failure, unspecified: Secondary | ICD-10-CM | POA: Diagnosis not present

## 2020-11-05 DIAGNOSIS — N183 Chronic kidney disease, stage 3 unspecified: Secondary | ICD-10-CM | POA: Diagnosis not present

## 2020-11-05 DIAGNOSIS — Z823 Family history of stroke: Secondary | ICD-10-CM | POA: Diagnosis not present

## 2020-11-05 DIAGNOSIS — Z880 Allergy status to penicillin: Secondary | ICD-10-CM | POA: Diagnosis not present

## 2020-11-05 DIAGNOSIS — R29898 Other symptoms and signs involving the musculoskeletal system: Secondary | ICD-10-CM | POA: Diagnosis not present

## 2020-11-05 DIAGNOSIS — A058 Other specified bacterial foodborne intoxications: Secondary | ICD-10-CM | POA: Diagnosis not present

## 2020-11-05 DIAGNOSIS — Z743 Need for continuous supervision: Secondary | ICD-10-CM | POA: Diagnosis not present

## 2020-11-05 DIAGNOSIS — M79674 Pain in right toe(s): Secondary | ICD-10-CM | POA: Diagnosis not present

## 2020-11-05 DIAGNOSIS — T68XXXA Hypothermia, initial encounter: Secondary | ICD-10-CM | POA: Diagnosis not present

## 2020-11-05 DIAGNOSIS — Z7901 Long term (current) use of anticoagulants: Secondary | ICD-10-CM | POA: Diagnosis not present

## 2020-11-05 DIAGNOSIS — M79671 Pain in right foot: Secondary | ICD-10-CM | POA: Diagnosis not present

## 2020-11-05 DIAGNOSIS — M6281 Muscle weakness (generalized): Secondary | ICD-10-CM | POA: Diagnosis not present

## 2020-11-05 DIAGNOSIS — M4800 Spinal stenosis, site unspecified: Secondary | ICD-10-CM | POA: Diagnosis not present

## 2020-11-05 DIAGNOSIS — I4891 Unspecified atrial fibrillation: Secondary | ICD-10-CM | POA: Diagnosis not present

## 2020-11-05 DIAGNOSIS — I130 Hypertensive heart and chronic kidney disease with heart failure and stage 1 through stage 4 chronic kidney disease, or unspecified chronic kidney disease: Secondary | ICD-10-CM | POA: Diagnosis not present

## 2020-11-05 DIAGNOSIS — Z981 Arthrodesis status: Secondary | ICD-10-CM | POA: Diagnosis not present

## 2020-11-05 DIAGNOSIS — Z20822 Contact with and (suspected) exposure to covid-19: Secondary | ICD-10-CM | POA: Diagnosis not present

## 2020-11-05 DIAGNOSIS — E872 Acidosis: Secondary | ICD-10-CM | POA: Diagnosis not present

## 2020-11-05 DIAGNOSIS — R112 Nausea with vomiting, unspecified: Secondary | ICD-10-CM | POA: Diagnosis not present

## 2020-11-05 DIAGNOSIS — Z9889 Other specified postprocedural states: Secondary | ICD-10-CM | POA: Diagnosis not present

## 2020-11-05 DIAGNOSIS — K219 Gastro-esophageal reflux disease without esophagitis: Secondary | ICD-10-CM | POA: Diagnosis not present

## 2020-11-05 DIAGNOSIS — R197 Diarrhea, unspecified: Secondary | ICD-10-CM | POA: Diagnosis not present

## 2020-11-05 DIAGNOSIS — Z8249 Family history of ischemic heart disease and other diseases of the circulatory system: Secondary | ICD-10-CM | POA: Diagnosis not present

## 2020-11-05 DIAGNOSIS — M419 Scoliosis, unspecified: Secondary | ICD-10-CM | POA: Diagnosis not present

## 2020-11-05 DIAGNOSIS — R0902 Hypoxemia: Secondary | ICD-10-CM | POA: Diagnosis not present

## 2020-11-05 DIAGNOSIS — M7989 Other specified soft tissue disorders: Secondary | ICD-10-CM | POA: Diagnosis not present

## 2020-11-06 DIAGNOSIS — N179 Acute kidney failure, unspecified: Secondary | ICD-10-CM | POA: Diagnosis not present

## 2020-11-06 DIAGNOSIS — R112 Nausea with vomiting, unspecified: Secondary | ICD-10-CM | POA: Diagnosis not present

## 2020-11-06 DIAGNOSIS — R197 Diarrhea, unspecified: Secondary | ICD-10-CM | POA: Diagnosis not present

## 2020-11-06 DIAGNOSIS — M4800 Spinal stenosis, site unspecified: Secondary | ICD-10-CM | POA: Diagnosis not present

## 2020-11-06 DIAGNOSIS — R5381 Other malaise: Secondary | ICD-10-CM | POA: Diagnosis not present

## 2020-11-06 DIAGNOSIS — I959 Hypotension, unspecified: Secondary | ICD-10-CM | POA: Diagnosis not present

## 2020-11-06 DIAGNOSIS — R531 Weakness: Secondary | ICD-10-CM | POA: Diagnosis not present

## 2020-11-06 DIAGNOSIS — R600 Localized edema: Secondary | ICD-10-CM | POA: Diagnosis not present

## 2020-11-06 DIAGNOSIS — Z7901 Long term (current) use of anticoagulants: Secondary | ICD-10-CM | POA: Diagnosis not present

## 2020-11-06 DIAGNOSIS — M419 Scoliosis, unspecified: Secondary | ICD-10-CM | POA: Diagnosis not present

## 2020-11-06 DIAGNOSIS — I4891 Unspecified atrial fibrillation: Secondary | ICD-10-CM | POA: Diagnosis not present

## 2020-11-07 DIAGNOSIS — R600 Localized edema: Secondary | ICD-10-CM | POA: Diagnosis not present

## 2020-11-07 DIAGNOSIS — R197 Diarrhea, unspecified: Secondary | ICD-10-CM | POA: Diagnosis not present

## 2020-11-07 DIAGNOSIS — R112 Nausea with vomiting, unspecified: Secondary | ICD-10-CM | POA: Diagnosis not present

## 2020-11-07 DIAGNOSIS — Z7901 Long term (current) use of anticoagulants: Secondary | ICD-10-CM | POA: Diagnosis not present

## 2020-11-07 DIAGNOSIS — N179 Acute kidney failure, unspecified: Secondary | ICD-10-CM | POA: Diagnosis not present

## 2020-11-07 DIAGNOSIS — M419 Scoliosis, unspecified: Secondary | ICD-10-CM | POA: Diagnosis not present

## 2020-11-07 DIAGNOSIS — R531 Weakness: Secondary | ICD-10-CM | POA: Diagnosis not present

## 2020-11-07 DIAGNOSIS — I959 Hypotension, unspecified: Secondary | ICD-10-CM | POA: Diagnosis not present

## 2020-11-07 DIAGNOSIS — R5381 Other malaise: Secondary | ICD-10-CM | POA: Diagnosis not present

## 2020-11-07 DIAGNOSIS — I4891 Unspecified atrial fibrillation: Secondary | ICD-10-CM | POA: Diagnosis not present

## 2020-11-07 DIAGNOSIS — M4800 Spinal stenosis, site unspecified: Secondary | ICD-10-CM | POA: Diagnosis not present

## 2020-11-08 DIAGNOSIS — M419 Scoliosis, unspecified: Secondary | ICD-10-CM | POA: Diagnosis not present

## 2020-11-08 DIAGNOSIS — Z7901 Long term (current) use of anticoagulants: Secondary | ICD-10-CM | POA: Diagnosis not present

## 2020-11-08 DIAGNOSIS — R600 Localized edema: Secondary | ICD-10-CM | POA: Diagnosis not present

## 2020-11-08 DIAGNOSIS — R5381 Other malaise: Secondary | ICD-10-CM | POA: Diagnosis not present

## 2020-11-08 DIAGNOSIS — I959 Hypotension, unspecified: Secondary | ICD-10-CM | POA: Diagnosis not present

## 2020-11-08 DIAGNOSIS — I4891 Unspecified atrial fibrillation: Secondary | ICD-10-CM | POA: Diagnosis not present

## 2020-11-08 DIAGNOSIS — R197 Diarrhea, unspecified: Secondary | ICD-10-CM | POA: Diagnosis not present

## 2020-11-08 DIAGNOSIS — R531 Weakness: Secondary | ICD-10-CM | POA: Diagnosis not present

## 2020-11-08 DIAGNOSIS — M4800 Spinal stenosis, site unspecified: Secondary | ICD-10-CM | POA: Diagnosis not present

## 2020-11-08 DIAGNOSIS — R112 Nausea with vomiting, unspecified: Secondary | ICD-10-CM | POA: Diagnosis not present

## 2020-11-08 DIAGNOSIS — N179 Acute kidney failure, unspecified: Secondary | ICD-10-CM | POA: Diagnosis not present

## 2020-11-09 DIAGNOSIS — N179 Acute kidney failure, unspecified: Secondary | ICD-10-CM | POA: Diagnosis not present

## 2020-11-09 DIAGNOSIS — M419 Scoliosis, unspecified: Secondary | ICD-10-CM | POA: Diagnosis not present

## 2020-11-09 DIAGNOSIS — R197 Diarrhea, unspecified: Secondary | ICD-10-CM | POA: Diagnosis not present

## 2020-11-09 DIAGNOSIS — I959 Hypotension, unspecified: Secondary | ICD-10-CM | POA: Diagnosis not present

## 2020-11-09 DIAGNOSIS — Z7901 Long term (current) use of anticoagulants: Secondary | ICD-10-CM | POA: Diagnosis not present

## 2020-11-09 DIAGNOSIS — L539 Erythematous condition, unspecified: Secondary | ICD-10-CM | POA: Diagnosis not present

## 2020-11-09 DIAGNOSIS — I4891 Unspecified atrial fibrillation: Secondary | ICD-10-CM | POA: Diagnosis not present

## 2020-11-09 DIAGNOSIS — R531 Weakness: Secondary | ICD-10-CM | POA: Diagnosis not present

## 2020-11-09 DIAGNOSIS — M79674 Pain in right toe(s): Secondary | ICD-10-CM | POA: Diagnosis not present

## 2020-11-09 DIAGNOSIS — R5381 Other malaise: Secondary | ICD-10-CM | POA: Diagnosis not present

## 2020-11-09 DIAGNOSIS — M4800 Spinal stenosis, site unspecified: Secondary | ICD-10-CM | POA: Diagnosis not present

## 2020-11-09 DIAGNOSIS — R112 Nausea with vomiting, unspecified: Secondary | ICD-10-CM | POA: Diagnosis not present

## 2020-11-10 DIAGNOSIS — R531 Weakness: Secondary | ICD-10-CM | POA: Diagnosis not present

## 2020-11-10 DIAGNOSIS — I959 Hypotension, unspecified: Secondary | ICD-10-CM | POA: Diagnosis not present

## 2020-11-10 DIAGNOSIS — R5381 Other malaise: Secondary | ICD-10-CM | POA: Diagnosis not present

## 2020-11-10 DIAGNOSIS — M79674 Pain in right toe(s): Secondary | ICD-10-CM | POA: Diagnosis not present

## 2020-11-10 DIAGNOSIS — Z7901 Long term (current) use of anticoagulants: Secondary | ICD-10-CM | POA: Diagnosis not present

## 2020-11-10 DIAGNOSIS — M419 Scoliosis, unspecified: Secondary | ICD-10-CM | POA: Diagnosis not present

## 2020-11-10 DIAGNOSIS — I4891 Unspecified atrial fibrillation: Secondary | ICD-10-CM | POA: Diagnosis not present

## 2020-11-10 DIAGNOSIS — N179 Acute kidney failure, unspecified: Secondary | ICD-10-CM | POA: Diagnosis not present

## 2020-11-10 DIAGNOSIS — L539 Erythematous condition, unspecified: Secondary | ICD-10-CM | POA: Diagnosis not present

## 2020-11-10 DIAGNOSIS — R197 Diarrhea, unspecified: Secondary | ICD-10-CM | POA: Diagnosis not present

## 2020-11-10 DIAGNOSIS — R112 Nausea with vomiting, unspecified: Secondary | ICD-10-CM | POA: Diagnosis not present

## 2020-11-10 DIAGNOSIS — M4800 Spinal stenosis, site unspecified: Secondary | ICD-10-CM | POA: Diagnosis not present

## 2020-11-11 DIAGNOSIS — M4800 Spinal stenosis, site unspecified: Secondary | ICD-10-CM | POA: Diagnosis not present

## 2020-11-11 DIAGNOSIS — M79671 Pain in right foot: Secondary | ICD-10-CM | POA: Diagnosis not present

## 2020-11-11 DIAGNOSIS — Z9889 Other specified postprocedural states: Secondary | ICD-10-CM | POA: Diagnosis not present

## 2020-11-11 DIAGNOSIS — M419 Scoliosis, unspecified: Secondary | ICD-10-CM | POA: Diagnosis not present

## 2020-11-11 DIAGNOSIS — I959 Hypotension, unspecified: Secondary | ICD-10-CM | POA: Diagnosis not present

## 2020-11-11 DIAGNOSIS — M7989 Other specified soft tissue disorders: Secondary | ICD-10-CM | POA: Diagnosis not present

## 2020-11-11 DIAGNOSIS — I4891 Unspecified atrial fibrillation: Secondary | ICD-10-CM | POA: Diagnosis not present

## 2020-11-11 DIAGNOSIS — R112 Nausea with vomiting, unspecified: Secondary | ICD-10-CM | POA: Diagnosis not present

## 2020-11-11 DIAGNOSIS — R197 Diarrhea, unspecified: Secondary | ICD-10-CM | POA: Diagnosis not present

## 2020-11-11 DIAGNOSIS — Z7901 Long term (current) use of anticoagulants: Secondary | ICD-10-CM | POA: Diagnosis not present

## 2020-11-11 DIAGNOSIS — N179 Acute kidney failure, unspecified: Secondary | ICD-10-CM | POA: Diagnosis not present

## 2020-11-11 DIAGNOSIS — R5381 Other malaise: Secondary | ICD-10-CM | POA: Diagnosis not present

## 2020-11-11 DIAGNOSIS — R531 Weakness: Secondary | ICD-10-CM | POA: Diagnosis not present

## 2020-11-11 DIAGNOSIS — L03115 Cellulitis of right lower limb: Secondary | ICD-10-CM | POA: Diagnosis not present

## 2020-11-12 DIAGNOSIS — E782 Mixed hyperlipidemia: Secondary | ICD-10-CM | POA: Diagnosis not present

## 2020-11-12 DIAGNOSIS — I480 Paroxysmal atrial fibrillation: Secondary | ICD-10-CM | POA: Diagnosis not present

## 2020-11-12 DIAGNOSIS — R29898 Other symptoms and signs involving the musculoskeletal system: Secondary | ICD-10-CM | POA: Diagnosis not present

## 2020-11-12 DIAGNOSIS — Z9889 Other specified postprocedural states: Secondary | ICD-10-CM | POA: Diagnosis not present

## 2020-11-12 DIAGNOSIS — M6258 Muscle wasting and atrophy, not elsewhere classified, other site: Secondary | ICD-10-CM | POA: Diagnosis not present

## 2020-11-12 DIAGNOSIS — M4126 Other idiopathic scoliosis, lumbar region: Secondary | ICD-10-CM | POA: Diagnosis not present

## 2020-11-12 DIAGNOSIS — M961 Postlaminectomy syndrome, not elsewhere classified: Secondary | ICD-10-CM | POA: Diagnosis not present

## 2020-11-12 DIAGNOSIS — M6281 Muscle weakness (generalized): Secondary | ICD-10-CM | POA: Diagnosis not present

## 2020-11-12 DIAGNOSIS — R2689 Other abnormalities of gait and mobility: Secondary | ICD-10-CM | POA: Diagnosis not present

## 2020-11-12 DIAGNOSIS — A059 Bacterial foodborne intoxication, unspecified: Secondary | ICD-10-CM | POA: Diagnosis not present

## 2020-11-12 DIAGNOSIS — R2681 Unsteadiness on feet: Secondary | ICD-10-CM | POA: Diagnosis not present

## 2020-11-12 DIAGNOSIS — L02415 Cutaneous abscess of right lower limb: Secondary | ICD-10-CM | POA: Diagnosis not present

## 2020-11-12 DIAGNOSIS — M1990 Unspecified osteoarthritis, unspecified site: Secondary | ICD-10-CM | POA: Diagnosis not present

## 2020-11-12 DIAGNOSIS — M419 Scoliosis, unspecified: Secondary | ICD-10-CM | POA: Diagnosis not present

## 2020-11-12 DIAGNOSIS — Z981 Arthrodesis status: Secondary | ICD-10-CM | POA: Diagnosis not present

## 2020-11-12 DIAGNOSIS — R52 Pain, unspecified: Secondary | ICD-10-CM | POA: Diagnosis not present

## 2020-11-12 DIAGNOSIS — N179 Acute kidney failure, unspecified: Secondary | ICD-10-CM | POA: Diagnosis not present

## 2020-11-12 DIAGNOSIS — I10 Essential (primary) hypertension: Secondary | ICD-10-CM | POA: Diagnosis not present

## 2020-11-12 DIAGNOSIS — R5381 Other malaise: Secondary | ICD-10-CM | POA: Diagnosis not present

## 2020-11-12 DIAGNOSIS — I4891 Unspecified atrial fibrillation: Secondary | ICD-10-CM | POA: Diagnosis not present

## 2020-11-12 DIAGNOSIS — L03115 Cellulitis of right lower limb: Secondary | ICD-10-CM | POA: Diagnosis not present

## 2020-11-12 DIAGNOSIS — I119 Hypertensive heart disease without heart failure: Secondary | ICD-10-CM | POA: Diagnosis not present

## 2020-11-12 DIAGNOSIS — M47816 Spondylosis without myelopathy or radiculopathy, lumbar region: Secondary | ICD-10-CM | POA: Diagnosis not present

## 2020-11-12 DIAGNOSIS — Z743 Need for continuous supervision: Secondary | ICD-10-CM | POA: Diagnosis not present

## 2020-11-12 DIAGNOSIS — M109 Gout, unspecified: Secondary | ICD-10-CM | POA: Diagnosis not present

## 2020-11-12 DIAGNOSIS — M4800 Spinal stenosis, site unspecified: Secondary | ICD-10-CM | POA: Diagnosis not present

## 2020-11-12 DIAGNOSIS — R197 Diarrhea, unspecified: Secondary | ICD-10-CM | POA: Diagnosis not present

## 2020-11-12 DIAGNOSIS — L0390 Cellulitis, unspecified: Secondary | ICD-10-CM | POA: Diagnosis not present

## 2020-11-12 DIAGNOSIS — R531 Weakness: Secondary | ICD-10-CM | POA: Diagnosis not present

## 2020-11-13 DIAGNOSIS — A059 Bacterial foodborne intoxication, unspecified: Secondary | ICD-10-CM | POA: Diagnosis not present

## 2020-11-13 DIAGNOSIS — N179 Acute kidney failure, unspecified: Secondary | ICD-10-CM | POA: Diagnosis not present

## 2020-11-13 DIAGNOSIS — R5381 Other malaise: Secondary | ICD-10-CM | POA: Diagnosis not present

## 2020-11-13 DIAGNOSIS — M419 Scoliosis, unspecified: Secondary | ICD-10-CM | POA: Diagnosis not present

## 2020-11-17 DIAGNOSIS — R2681 Unsteadiness on feet: Secondary | ICD-10-CM | POA: Diagnosis not present

## 2020-11-17 DIAGNOSIS — M4126 Other idiopathic scoliosis, lumbar region: Secondary | ICD-10-CM | POA: Diagnosis not present

## 2020-11-17 DIAGNOSIS — L02415 Cutaneous abscess of right lower limb: Secondary | ICD-10-CM | POA: Diagnosis not present

## 2020-11-17 DIAGNOSIS — M1990 Unspecified osteoarthritis, unspecified site: Secondary | ICD-10-CM | POA: Diagnosis not present

## 2020-11-17 DIAGNOSIS — E782 Mixed hyperlipidemia: Secondary | ICD-10-CM | POA: Diagnosis not present

## 2020-11-17 DIAGNOSIS — M6281 Muscle weakness (generalized): Secondary | ICD-10-CM | POA: Diagnosis not present

## 2020-11-17 DIAGNOSIS — R52 Pain, unspecified: Secondary | ICD-10-CM | POA: Diagnosis not present

## 2020-11-17 DIAGNOSIS — R197 Diarrhea, unspecified: Secondary | ICD-10-CM | POA: Diagnosis not present

## 2020-11-17 DIAGNOSIS — M961 Postlaminectomy syndrome, not elsewhere classified: Secondary | ICD-10-CM | POA: Diagnosis not present

## 2020-11-17 DIAGNOSIS — I10 Essential (primary) hypertension: Secondary | ICD-10-CM | POA: Diagnosis not present

## 2020-11-17 DIAGNOSIS — I4891 Unspecified atrial fibrillation: Secondary | ICD-10-CM | POA: Diagnosis not present

## 2020-11-18 DIAGNOSIS — N179 Acute kidney failure, unspecified: Secondary | ICD-10-CM | POA: Diagnosis not present

## 2020-11-18 DIAGNOSIS — Z9889 Other specified postprocedural states: Secondary | ICD-10-CM | POA: Diagnosis not present

## 2020-11-18 DIAGNOSIS — M47816 Spondylosis without myelopathy or radiculopathy, lumbar region: Secondary | ICD-10-CM | POA: Diagnosis not present

## 2020-11-18 DIAGNOSIS — Z981 Arthrodesis status: Secondary | ICD-10-CM | POA: Diagnosis not present

## 2020-11-18 DIAGNOSIS — M109 Gout, unspecified: Secondary | ICD-10-CM | POA: Diagnosis not present

## 2020-11-18 DIAGNOSIS — L0390 Cellulitis, unspecified: Secondary | ICD-10-CM | POA: Diagnosis not present

## 2020-11-19 DIAGNOSIS — M419 Scoliosis, unspecified: Secondary | ICD-10-CM | POA: Diagnosis not present

## 2020-11-19 DIAGNOSIS — N179 Acute kidney failure, unspecified: Secondary | ICD-10-CM | POA: Diagnosis not present

## 2020-11-19 DIAGNOSIS — L0390 Cellulitis, unspecified: Secondary | ICD-10-CM | POA: Diagnosis not present

## 2020-11-19 DIAGNOSIS — A059 Bacterial foodborne intoxication, unspecified: Secondary | ICD-10-CM | POA: Diagnosis not present

## 2020-11-20 DIAGNOSIS — I119 Hypertensive heart disease without heart failure: Secondary | ICD-10-CM | POA: Diagnosis not present

## 2020-11-20 DIAGNOSIS — M109 Gout, unspecified: Secondary | ICD-10-CM | POA: Diagnosis not present

## 2020-11-20 DIAGNOSIS — L0390 Cellulitis, unspecified: Secondary | ICD-10-CM | POA: Diagnosis not present

## 2020-11-20 DIAGNOSIS — I480 Paroxysmal atrial fibrillation: Secondary | ICD-10-CM | POA: Diagnosis not present

## 2020-11-25 DIAGNOSIS — R269 Unspecified abnormalities of gait and mobility: Secondary | ICD-10-CM | POA: Diagnosis not present

## 2020-11-25 DIAGNOSIS — I10 Essential (primary) hypertension: Secondary | ICD-10-CM | POA: Diagnosis not present

## 2020-11-25 DIAGNOSIS — M4326 Fusion of spine, lumbar region: Secondary | ICD-10-CM | POA: Diagnosis not present

## 2020-11-25 DIAGNOSIS — N179 Acute kidney failure, unspecified: Secondary | ICD-10-CM | POA: Diagnosis not present

## 2020-11-25 DIAGNOSIS — M6281 Muscle weakness (generalized): Secondary | ICD-10-CM | POA: Diagnosis not present

## 2020-11-25 DIAGNOSIS — E785 Hyperlipidemia, unspecified: Secondary | ICD-10-CM | POA: Diagnosis not present

## 2020-11-25 DIAGNOSIS — M6258 Muscle wasting and atrophy, not elsewhere classified, other site: Secondary | ICD-10-CM | POA: Diagnosis not present

## 2020-11-25 DIAGNOSIS — I4891 Unspecified atrial fibrillation: Secondary | ICD-10-CM | POA: Diagnosis not present

## 2020-11-25 DIAGNOSIS — M961 Postlaminectomy syndrome, not elsewhere classified: Secondary | ICD-10-CM | POA: Diagnosis not present

## 2020-11-29 DIAGNOSIS — I4891 Unspecified atrial fibrillation: Secondary | ICD-10-CM | POA: Diagnosis not present

## 2020-11-29 DIAGNOSIS — I10 Essential (primary) hypertension: Secondary | ICD-10-CM | POA: Diagnosis not present

## 2020-11-29 DIAGNOSIS — M6258 Muscle wasting and atrophy, not elsewhere classified, other site: Secondary | ICD-10-CM | POA: Diagnosis not present

## 2020-11-29 DIAGNOSIS — M961 Postlaminectomy syndrome, not elsewhere classified: Secondary | ICD-10-CM | POA: Diagnosis not present

## 2020-11-29 DIAGNOSIS — M4326 Fusion of spine, lumbar region: Secondary | ICD-10-CM | POA: Diagnosis not present

## 2020-11-29 DIAGNOSIS — R269 Unspecified abnormalities of gait and mobility: Secondary | ICD-10-CM | POA: Diagnosis not present

## 2020-11-29 DIAGNOSIS — M6281 Muscle weakness (generalized): Secondary | ICD-10-CM | POA: Diagnosis not present

## 2020-11-29 DIAGNOSIS — E785 Hyperlipidemia, unspecified: Secondary | ICD-10-CM | POA: Diagnosis not present

## 2020-11-29 DIAGNOSIS — N179 Acute kidney failure, unspecified: Secondary | ICD-10-CM | POA: Diagnosis not present

## 2020-12-06 DIAGNOSIS — M6258 Muscle wasting and atrophy, not elsewhere classified, other site: Secondary | ICD-10-CM | POA: Diagnosis not present

## 2020-12-06 DIAGNOSIS — R269 Unspecified abnormalities of gait and mobility: Secondary | ICD-10-CM | POA: Diagnosis not present

## 2020-12-06 DIAGNOSIS — I10 Essential (primary) hypertension: Secondary | ICD-10-CM | POA: Diagnosis not present

## 2020-12-06 DIAGNOSIS — M4326 Fusion of spine, lumbar region: Secondary | ICD-10-CM | POA: Diagnosis not present

## 2020-12-06 DIAGNOSIS — M961 Postlaminectomy syndrome, not elsewhere classified: Secondary | ICD-10-CM | POA: Diagnosis not present

## 2020-12-06 DIAGNOSIS — M6281 Muscle weakness (generalized): Secondary | ICD-10-CM | POA: Diagnosis not present

## 2020-12-06 DIAGNOSIS — E785 Hyperlipidemia, unspecified: Secondary | ICD-10-CM | POA: Diagnosis not present

## 2020-12-06 DIAGNOSIS — I4891 Unspecified atrial fibrillation: Secondary | ICD-10-CM | POA: Diagnosis not present

## 2020-12-06 DIAGNOSIS — N179 Acute kidney failure, unspecified: Secondary | ICD-10-CM | POA: Diagnosis not present

## 2020-12-08 DIAGNOSIS — G8929 Other chronic pain: Secondary | ICD-10-CM | POA: Diagnosis not present

## 2020-12-08 DIAGNOSIS — Z9889 Other specified postprocedural states: Secondary | ICD-10-CM | POA: Diagnosis not present

## 2020-12-08 DIAGNOSIS — M545 Low back pain, unspecified: Secondary | ICD-10-CM | POA: Diagnosis not present

## 2020-12-13 DIAGNOSIS — M6281 Muscle weakness (generalized): Secondary | ICD-10-CM | POA: Diagnosis not present

## 2020-12-13 DIAGNOSIS — R269 Unspecified abnormalities of gait and mobility: Secondary | ICD-10-CM | POA: Diagnosis not present

## 2020-12-13 DIAGNOSIS — I4891 Unspecified atrial fibrillation: Secondary | ICD-10-CM | POA: Diagnosis not present

## 2020-12-13 DIAGNOSIS — M961 Postlaminectomy syndrome, not elsewhere classified: Secondary | ICD-10-CM | POA: Diagnosis not present

## 2020-12-13 DIAGNOSIS — I10 Essential (primary) hypertension: Secondary | ICD-10-CM | POA: Diagnosis not present

## 2020-12-13 DIAGNOSIS — E785 Hyperlipidemia, unspecified: Secondary | ICD-10-CM | POA: Diagnosis not present

## 2020-12-13 DIAGNOSIS — M6258 Muscle wasting and atrophy, not elsewhere classified, other site: Secondary | ICD-10-CM | POA: Diagnosis not present

## 2020-12-13 DIAGNOSIS — N179 Acute kidney failure, unspecified: Secondary | ICD-10-CM | POA: Diagnosis not present

## 2020-12-13 DIAGNOSIS — M4326 Fusion of spine, lumbar region: Secondary | ICD-10-CM | POA: Diagnosis not present

## 2020-12-25 DIAGNOSIS — Z981 Arthrodesis status: Secondary | ICD-10-CM | POA: Diagnosis not present

## 2020-12-25 DIAGNOSIS — S22070A Wedge compression fracture of T9-T10 vertebra, initial encounter for closed fracture: Secondary | ICD-10-CM | POA: Diagnosis not present

## 2020-12-25 DIAGNOSIS — M47814 Spondylosis without myelopathy or radiculopathy, thoracic region: Secondary | ICD-10-CM | POA: Diagnosis not present

## 2020-12-25 DIAGNOSIS — S22079D Unspecified fracture of T9-T10 vertebra, subsequent encounter for fracture with routine healing: Secondary | ICD-10-CM | POA: Diagnosis not present

## 2020-12-25 DIAGNOSIS — M40204 Unspecified kyphosis, thoracic region: Secondary | ICD-10-CM | POA: Diagnosis not present

## 2020-12-25 DIAGNOSIS — S22070D Wedge compression fracture of T9-T10 vertebra, subsequent encounter for fracture with routine healing: Secondary | ICD-10-CM | POA: Diagnosis not present

## 2020-12-25 DIAGNOSIS — K8020 Calculus of gallbladder without cholecystitis without obstruction: Secondary | ICD-10-CM | POA: Diagnosis not present

## 2020-12-26 DIAGNOSIS — Z981 Arthrodesis status: Secondary | ICD-10-CM | POA: Diagnosis not present

## 2020-12-26 DIAGNOSIS — S22079D Unspecified fracture of T9-T10 vertebra, subsequent encounter for fracture with routine healing: Secondary | ICD-10-CM | POA: Diagnosis not present

## 2020-12-26 DIAGNOSIS — M40294 Other kyphosis, thoracic region: Secondary | ICD-10-CM | POA: Diagnosis not present

## 2020-12-26 DIAGNOSIS — Z4789 Encounter for other orthopedic aftercare: Secondary | ICD-10-CM | POA: Diagnosis not present

## 2021-01-14 DIAGNOSIS — Z981 Arthrodesis status: Secondary | ICD-10-CM | POA: Diagnosis not present

## 2021-01-14 DIAGNOSIS — M4004 Postural kyphosis, thoracic region: Secondary | ICD-10-CM | POA: Diagnosis not present

## 2021-01-14 DIAGNOSIS — M4186 Other forms of scoliosis, lumbar region: Secondary | ICD-10-CM | POA: Diagnosis not present

## 2021-01-14 DIAGNOSIS — M4184 Other forms of scoliosis, thoracic region: Secondary | ICD-10-CM | POA: Diagnosis not present

## 2021-01-14 DIAGNOSIS — Z7901 Long term (current) use of anticoagulants: Secondary | ICD-10-CM | POA: Diagnosis not present

## 2021-01-14 DIAGNOSIS — S22079D Unspecified fracture of T9-T10 vertebra, subsequent encounter for fracture with routine healing: Secondary | ICD-10-CM | POA: Diagnosis not present

## 2021-01-14 DIAGNOSIS — S22070G Wedge compression fracture of T9-T10 vertebra, subsequent encounter for fracture with delayed healing: Secondary | ICD-10-CM | POA: Diagnosis not present

## 2021-01-15 ENCOUNTER — Telehealth: Payer: Self-pay | Admitting: Cardiology

## 2021-01-15 NOTE — Telephone Encounter (Signed)
   Name: Katherine Mcpherson  DOB: 10/29/1945  MRN: [id number]   Primary Cardiologist: Shirlee More, MD  Chart reviewed as part of pre-operative protocol coverage. Patient was contacted 01/15/2021 in reference to pre-operative risk assessment for pending surgery as outlined below.  Katherine Mcpherson was last seen on 08/22/20 by Dr. Bettina Gavia.  Since that day, Katherine Mcpherson has done well.  He cleared her for her initial surgery in August, which she completed without cardiac complications. Unfortunately, she fell and suffered vertebrae fractures and will need a second surgery for repair. Her mobility is limited by back pain/fractures. She can climb a flight of stairs without angina (10 steps). She has no unstable cardiac conditions.   Will await pharmD for final recommendations on eliquis.   Therefore, based on ACC/AHA guidelines, the patient would be at acceptable risk for the planned procedure without further cardiovascular testing.   The patient was advised that if she develops new symptoms prior to surgery to contact our office to arrange for a follow-up visit, and she verbalized understanding.  I will route this recommendation to the requesting party via Epic fax function and remove from pre-op pool. Please call with questions.  [place], PA 01/15/2021, 11:28 AM

## 2021-01-15 NOTE — Telephone Encounter (Signed)
     [facility] Pre-operative Risk Assessment    Patient Name: Katherine Mcpherson  DOB: 1946/01/31 MRN: [id number]  HEARTCARE STAFF:  - IMPORTANT!!!!!! Under Visit Info/Reason for Call, type in Other and utilize the format Clearance MM/DD/YY or Clearance TBD. Do not use dashes or single digits. - Please review there is not already an duplicate clearance open for this procedure. - If request is for dental extraction, please clarify the # of teeth to be extracted. - If the patient is currently at the dentist's office, call Pre-Op Callback Staff (MA/nurse) to input urgent request.  - If the patient is not currently in the dentist office, please route to the Pre-Op pool.  Request for surgical clearance:  What type of surgery is being performed? Thoracic stabilization T6 through T10 extension to existing fusion, 4 and a half case  When is this surgery scheduled? 01/29/21  What type of clearance is required (medical clearance vs. Pharmacy clearance to hold med vs. Both)? Both  Are there any medications that need to be held prior to surgery and how long? Eliquis 3 days prior procedure, 5 days post op  Practice name and name of physician performing surgery? Dr. Jenne Campus - [facility], spine center   What is the office phone number? [phone]   7.   What is the office fax number? [phone]  8.   Anesthesia type (None, local, MAC, general) ? General   Angeline S Hammer 01/15/2021, 9:53 AM  _________________________________________________________________   (provider comments below)

## 2021-01-16 NOTE — Telephone Encounter (Signed)
Patient with diagnosis of afib on Eliquis for anticoagulation.    Procedure: Thoracic stabilization T6 through T10 extension to existing fusion, 4 and a half case Date of procedure: 01/29/21  CHA2DS2-VASc Score = 4   This indicates a 4.8% annual risk of stroke. The patient's score is based upon: CHF History: 0 HTN History: 1 Diabetes History: 0 Stroke History: 0 Vascular Disease History: 0 Age Score: 2 Gender Score: 1     CrCl 99 ml/min  Per office protocol, patient can hold Eliquis for 3 days prior to procedure.    Patient should resume Eliquis as soon as the MD feels it is safe to do so

## 2021-01-16 NOTE — Telephone Encounter (Signed)
    Patient Name: Katherine Mcpherson  DOB: [birth year] MRN: [id number]  Primary Cardiologist: Shirlee More, MD  Chart reviewed as part of pre-operative protocol coverage.  Per [name], PA-C as noted below and based on ACC/AHA guidelines, [name] would be at acceptable risk for the planned procedure without further cardiovascular testing.   The patient was advised that if she develops new symptoms prior to surgery to contact our office to arrange for a follow-up visit, and she verbalized understanding.  Per pharmacy recommendation, patient can hold Eliquis 3 days prior to her upcoming procedure with plans to restart as soon as she is cleared to do so by her surgeon.  Dr. Bettina Gavia has cleared her to hold 5 days postop if necessary.  I will route this recommendation to the requesting party via Epic fax function and remove from pre-op pool.  Please call with questions.  Abigail Butts, PA-C 01/16/2021, 2:53 PM

## 2021-01-21 DIAGNOSIS — Z981 Arthrodesis status: Secondary | ICD-10-CM | POA: Diagnosis not present

## 2021-01-21 DIAGNOSIS — Z01812 Encounter for preprocedural laboratory examination: Secondary | ICD-10-CM | POA: Diagnosis not present

## 2021-01-21 DIAGNOSIS — S22079A Unspecified fracture of T9-T10 vertebra, initial encounter for closed fracture: Secondary | ICD-10-CM | POA: Diagnosis not present

## 2021-01-21 DIAGNOSIS — D649 Anemia, unspecified: Secondary | ICD-10-CM | POA: Diagnosis not present

## 2021-01-21 DIAGNOSIS — M4004 Postural kyphosis, thoracic region: Secondary | ICD-10-CM | POA: Diagnosis not present

## 2021-01-28 DIAGNOSIS — I10 Essential (primary) hypertension: Secondary | ICD-10-CM | POA: Diagnosis not present

## 2021-01-28 DIAGNOSIS — K219 Gastro-esophageal reflux disease without esophagitis: Secondary | ICD-10-CM | POA: Diagnosis not present

## 2021-01-28 DIAGNOSIS — Z87891 Personal history of nicotine dependence: Secondary | ICD-10-CM | POA: Diagnosis not present

## 2021-01-28 DIAGNOSIS — Z7901 Long term (current) use of anticoagulants: Secondary | ICD-10-CM | POA: Diagnosis not present

## 2021-01-28 DIAGNOSIS — G8918 Other acute postprocedural pain: Secondary | ICD-10-CM | POA: Diagnosis not present

## 2021-01-28 DIAGNOSIS — Z981 Arthrodesis status: Secondary | ICD-10-CM | POA: Diagnosis not present

## 2021-01-28 DIAGNOSIS — D508 Other iron deficiency anemias: Secondary | ICD-10-CM | POA: Diagnosis not present

## 2021-01-28 DIAGNOSIS — S22078K Other fracture of T9-T10 vertebra, subsequent encounter for fracture with nonunion: Secondary | ICD-10-CM | POA: Diagnosis not present

## 2021-01-28 DIAGNOSIS — S22079K Unspecified fracture of T9-T10 vertebra, subsequent encounter for fracture with nonunion: Secondary | ICD-10-CM | POA: Diagnosis not present

## 2021-01-28 DIAGNOSIS — Z79891 Long term (current) use of opiate analgesic: Secondary | ICD-10-CM | POA: Diagnosis not present

## 2021-01-28 DIAGNOSIS — M4004 Postural kyphosis, thoracic region: Secondary | ICD-10-CM | POA: Diagnosis not present

## 2021-01-28 DIAGNOSIS — S22079A Unspecified fracture of T9-T10 vertebra, initial encounter for closed fracture: Secondary | ICD-10-CM | POA: Diagnosis not present

## 2021-01-28 DIAGNOSIS — M963 Postlaminectomy kyphosis: Secondary | ICD-10-CM | POA: Diagnosis not present

## 2021-01-28 DIAGNOSIS — G8929 Other chronic pain: Secondary | ICD-10-CM | POA: Diagnosis not present

## 2021-01-28 DIAGNOSIS — E7800 Pure hypercholesterolemia, unspecified: Secondary | ICD-10-CM | POA: Diagnosis not present

## 2021-01-28 DIAGNOSIS — Z8249 Family history of ischemic heart disease and other diseases of the circulatory system: Secondary | ICD-10-CM | POA: Diagnosis not present

## 2021-01-28 DIAGNOSIS — S22078A Other fracture of T9-T10 vertebra, initial encounter for closed fracture: Secondary | ICD-10-CM | POA: Diagnosis not present

## 2021-01-28 DIAGNOSIS — I480 Paroxysmal atrial fibrillation: Secondary | ICD-10-CM | POA: Diagnosis not present

## 2021-02-10 ENCOUNTER — Encounter: Payer: Self-pay | Admitting: Cardiology

## 2021-02-13 DIAGNOSIS — Z4802 Encounter for removal of sutures: Secondary | ICD-10-CM | POA: Diagnosis not present

## 2021-02-27 DIAGNOSIS — Z981 Arthrodesis status: Secondary | ICD-10-CM | POA: Diagnosis not present

## 2021-02-27 DIAGNOSIS — Z4789 Encounter for other orthopedic aftercare: Secondary | ICD-10-CM | POA: Diagnosis not present

## 2021-03-03 ENCOUNTER — Encounter: Payer: Self-pay | Admitting: Cardiology

## 2021-03-12 DIAGNOSIS — K2100 Gastro-esophageal reflux disease with esophagitis, without bleeding: Secondary | ICD-10-CM | POA: Diagnosis not present

## 2021-03-12 DIAGNOSIS — R202 Paresthesia of skin: Secondary | ICD-10-CM | POA: Diagnosis not present

## 2021-03-18 DIAGNOSIS — M545 Low back pain, unspecified: Secondary | ICD-10-CM | POA: Diagnosis not present

## 2021-03-18 DIAGNOSIS — Z981 Arthrodesis status: Secondary | ICD-10-CM | POA: Diagnosis not present

## 2021-03-23 DIAGNOSIS — M545 Low back pain, unspecified: Secondary | ICD-10-CM | POA: Diagnosis not present

## 2021-03-23 DIAGNOSIS — Z981 Arthrodesis status: Secondary | ICD-10-CM | POA: Diagnosis not present

## 2021-03-25 DIAGNOSIS — Z981 Arthrodesis status: Secondary | ICD-10-CM | POA: Diagnosis not present

## 2021-03-25 DIAGNOSIS — M545 Low back pain, unspecified: Secondary | ICD-10-CM | POA: Diagnosis not present

## 2021-03-31 DIAGNOSIS — M545 Low back pain, unspecified: Secondary | ICD-10-CM | POA: Diagnosis not present

## 2021-03-31 DIAGNOSIS — Z981 Arthrodesis status: Secondary | ICD-10-CM | POA: Diagnosis not present

## 2021-04-01 DIAGNOSIS — U071 COVID-19: Secondary | ICD-10-CM | POA: Diagnosis not present

## 2021-04-15 DIAGNOSIS — M40294 Other kyphosis, thoracic region: Secondary | ICD-10-CM | POA: Diagnosis not present

## 2021-04-15 DIAGNOSIS — M5134 Other intervertebral disc degeneration, thoracic region: Secondary | ICD-10-CM | POA: Diagnosis not present

## 2021-04-15 DIAGNOSIS — M5184 Other intervertebral disc disorders, thoracic region: Secondary | ICD-10-CM | POA: Diagnosis not present

## 2021-04-15 DIAGNOSIS — Z79899 Other long term (current) drug therapy: Secondary | ICD-10-CM | POA: Diagnosis not present

## 2021-04-15 DIAGNOSIS — Z981 Arthrodesis status: Secondary | ICD-10-CM | POA: Diagnosis not present

## 2021-04-16 DIAGNOSIS — Z981 Arthrodesis status: Secondary | ICD-10-CM | POA: Diagnosis not present

## 2021-04-16 DIAGNOSIS — M545 Low back pain, unspecified: Secondary | ICD-10-CM | POA: Diagnosis not present

## 2021-04-22 DIAGNOSIS — M545 Low back pain, unspecified: Secondary | ICD-10-CM | POA: Diagnosis not present

## 2021-04-22 DIAGNOSIS — Z981 Arthrodesis status: Secondary | ICD-10-CM | POA: Diagnosis not present

## 2021-04-27 DIAGNOSIS — M545 Low back pain, unspecified: Secondary | ICD-10-CM | POA: Diagnosis not present

## 2021-04-27 DIAGNOSIS — Z981 Arthrodesis status: Secondary | ICD-10-CM | POA: Diagnosis not present

## 2021-04-28 ENCOUNTER — Other Ambulatory Visit: Payer: Self-pay | Admitting: Cardiology

## 2021-05-05 DIAGNOSIS — Z981 Arthrodesis status: Secondary | ICD-10-CM | POA: Diagnosis not present

## 2021-05-05 DIAGNOSIS — M545 Low back pain, unspecified: Secondary | ICD-10-CM | POA: Diagnosis not present

## 2021-05-07 DIAGNOSIS — K219 Gastro-esophageal reflux disease without esophagitis: Secondary | ICD-10-CM | POA: Diagnosis not present

## 2021-05-07 DIAGNOSIS — K5730 Diverticulosis of large intestine without perforation or abscess without bleeding: Secondary | ICD-10-CM | POA: Diagnosis not present

## 2021-05-07 DIAGNOSIS — E669 Obesity, unspecified: Secondary | ICD-10-CM | POA: Diagnosis not present

## 2021-05-07 DIAGNOSIS — K449 Diaphragmatic hernia without obstruction or gangrene: Secondary | ICD-10-CM | POA: Diagnosis not present

## 2021-05-07 DIAGNOSIS — Z86010 Personal history of colonic polyps: Secondary | ICD-10-CM | POA: Diagnosis not present

## 2021-05-07 DIAGNOSIS — R1013 Epigastric pain: Secondary | ICD-10-CM | POA: Diagnosis not present

## 2021-05-08 ENCOUNTER — Encounter: Payer: Self-pay | Admitting: Podiatry

## 2021-05-09 MED ORDER — METHYLPREDNISOLONE 4 MG PO TBPK: ORAL_TABLET | ORAL | 0 refills | Status: DC

## 2021-05-11 DIAGNOSIS — Z9842 Cataract extraction status, left eye: Secondary | ICD-10-CM | POA: Diagnosis not present

## 2021-05-11 DIAGNOSIS — Z961 Presence of intraocular lens: Secondary | ICD-10-CM | POA: Diagnosis not present

## 2021-05-11 DIAGNOSIS — H26493 Other secondary cataract, bilateral: Secondary | ICD-10-CM | POA: Diagnosis not present

## 2021-05-12 ENCOUNTER — Other Ambulatory Visit: Payer: Self-pay | Admitting: Family Medicine

## 2021-05-12 DIAGNOSIS — Z1231 Encounter for screening mammogram for malignant neoplasm of breast: Secondary | ICD-10-CM

## 2021-05-13 DIAGNOSIS — Z981 Arthrodesis status: Secondary | ICD-10-CM | POA: Diagnosis not present

## 2021-05-13 DIAGNOSIS — M545 Low back pain, unspecified: Secondary | ICD-10-CM | POA: Diagnosis not present

## 2021-05-14 ENCOUNTER — Ambulatory Visit
Admission: RE | Admit: 2021-05-14 | Discharge: 2021-05-14 | Disposition: A | Discharge status: Home / Self Care | Payer: PPO | Source: Ambulatory Visit | Attending: Family Medicine | Admitting: Family Medicine

## 2021-05-14 DIAGNOSIS — Z1231 Encounter for screening mammogram for malignant neoplasm of breast: Secondary | ICD-10-CM

## 2021-05-19 ENCOUNTER — Ambulatory Visit: Payer: PPO | Admitting: Podiatry

## 2021-05-19 ENCOUNTER — Other Ambulatory Visit: Payer: Self-pay

## 2021-05-19 ENCOUNTER — Ambulatory Visit (INDEPENDENT_AMBULATORY_CARE_PROVIDER_SITE_OTHER): Payer: PPO

## 2021-05-19 DIAGNOSIS — M2011 Hallux valgus (acquired), right foot: Secondary | ICD-10-CM | POA: Diagnosis not present

## 2021-05-19 DIAGNOSIS — M19071 Primary osteoarthritis, right ankle and foot: Secondary | ICD-10-CM | POA: Diagnosis not present

## 2021-05-19 DIAGNOSIS — M21611 Bunion of right foot: Secondary | ICD-10-CM

## 2021-05-19 DIAGNOSIS — R52 Pain, unspecified: Secondary | ICD-10-CM

## 2021-05-20 DIAGNOSIS — M545 Low back pain, unspecified: Secondary | ICD-10-CM | POA: Diagnosis not present

## 2021-05-20 DIAGNOSIS — Z981 Arthrodesis status: Secondary | ICD-10-CM | POA: Diagnosis not present

## 2021-05-20 NOTE — Progress Notes (Signed)
?  Subjective:  ?Patient ID: Katherine Mcpherson, female    DOB: 02/13/1946,  MRN: [id number] ? ?Chief Complaint  ?Patient presents with  ? Foot Pain  ?   sharp pain in right foot esp at night  ? ? ?76 y.o. female presents with the above complaint. History confirmed with patient.  She returns for follow-up today of her right foot pain.  The injection in the midfoot last year helped quite a bit and she was able to go to her trip to [place].  The carbon fiber insert is still helping and she is still wearing it.  Most of the pain now is in the bump around the bunion and in the big toe joint. ? ?Objective:  ?Physical Exam: ?warm, good capillary refill, no trophic changes or ulcerative lesions, normal DP and PT pulses, normal sensory exam, and right foot she has severe hallux valgus deformity with erythema around the medial eminence of the great toe she has pain and crepitance with range of motion especially in the plantar portions of the joint. ? ?Radiographs: ?Multiple views x-ray of the right foot: Severe hallux valgus deformity with metatarsus adductus osteoarthritis of the midfoot and severe osteoarthrosis of the sesamoid complex progress since last radiographs last year.  She has flattening of the second metatarsal head. ?Assessment:  ? ?1. Hallux valgus with bunions, right   ?2. Osteoarthritis of first metatarsophalangeal (MTP) joint of right foot   ? ? ? ?Plan:  ?Patient was evaluated and treated and all questions answered. ? ?She had quite a bit of improvement with her midfoot arthritis with injection therapy and a stiff soled orthosis.  Today pain was mostly centered around her hallux valgus deformity with osteoarthritis of the metatarsal phalangeal joint.  We reviewed her x-rays together in detail.  We discussed nonsurgical treatment of bunion deformity including wider shoe gear padding and injection therapy for the painful arthritis.  I also discussed with her healthy metatarsal adductus contributes to this and  that correction or straightening of the toe from an osteotomy standpoint would likely require midfoot corrective osteotomy and arthrodesis and this would be quite an undertaking.  I discussed alternatively surgical consideration could be taken to fuse the great toe joint in a rectus position that would limit the arthritis as well.  She is had multiple spinal surgeries in the last year and has had not to proceed with any surgery at this point which I understand and agree with.  I discussed with her that we can proceed with an injection today and she will return as needed for this.  Following sterile prep with Betadine I injected 10 mg of Kenalog, 2 mg of dexamethasone phosphate and 1/2 cc of lidocaine 2% plain.  She tolerated well was dressed with a Band-Aid and I will see her back as needed for this or other issues. ? ?Return if symptoms worsen or fail to improve.  ? ?

## 2021-05-20 NOTE — Addendum Note (Signed)
Addended bySherryle Lis, Taleshia Luff R on: 05/20/2021 05:44 PM ? ? Modules accepted: Level of Service ? ?

## 2021-05-21 DIAGNOSIS — R1013 Epigastric pain: Secondary | ICD-10-CM | POA: Diagnosis not present

## 2021-05-21 DIAGNOSIS — K5730 Diverticulosis of large intestine without perforation or abscess without bleeding: Secondary | ICD-10-CM | POA: Diagnosis not present

## 2021-05-21 DIAGNOSIS — K219 Gastro-esophageal reflux disease without esophagitis: Secondary | ICD-10-CM | POA: Diagnosis not present

## 2021-05-21 DIAGNOSIS — K449 Diaphragmatic hernia without obstruction or gangrene: Secondary | ICD-10-CM | POA: Diagnosis not present

## 2021-05-27 DIAGNOSIS — M545 Low back pain, unspecified: Secondary | ICD-10-CM | POA: Diagnosis not present

## 2021-05-27 DIAGNOSIS — Z981 Arthrodesis status: Secondary | ICD-10-CM | POA: Diagnosis not present

## 2021-06-03 DIAGNOSIS — Z981 Arthrodesis status: Secondary | ICD-10-CM | POA: Diagnosis not present

## 2021-06-03 DIAGNOSIS — M545 Low back pain, unspecified: Secondary | ICD-10-CM | POA: Diagnosis not present

## 2021-06-15 DIAGNOSIS — M545 Low back pain, unspecified: Secondary | ICD-10-CM | POA: Diagnosis not present

## 2021-06-15 DIAGNOSIS — Z981 Arthrodesis status: Secondary | ICD-10-CM | POA: Diagnosis not present

## 2021-06-18 ENCOUNTER — Telehealth: Payer: Self-pay | Admitting: Cardiology

## 2021-06-18 NOTE — Telephone Encounter (Signed)
Pt would like to know if "If major back surgery can affect breathing and cause chest soreness?" ? ?Please advise ? ?Thank you ?

## 2021-06-18 NOTE — Telephone Encounter (Signed)
Spoke with pt who states the discomfort is a soreness and increases with movement and deep breathing. Pt does not have shortness of breath. Pt verbalized understanding to call 911 for chest pressures, tightness or pain, radiation of pain and nausea/vomiting, shortness of breath or diaphoresis. Appointment has been made for follow up. ?

## 2021-06-22 DIAGNOSIS — Z981 Arthrodesis status: Secondary | ICD-10-CM | POA: Diagnosis not present

## 2021-06-22 DIAGNOSIS — M545 Low back pain, unspecified: Secondary | ICD-10-CM | POA: Diagnosis not present

## 2021-06-24 ENCOUNTER — Encounter: Payer: Self-pay | Admitting: Cardiology

## 2021-06-24 DIAGNOSIS — I10 Essential (primary) hypertension: Secondary | ICD-10-CM | POA: Diagnosis not present

## 2021-06-24 DIAGNOSIS — R791 Abnormal coagulation profile: Secondary | ICD-10-CM | POA: Diagnosis not present

## 2021-06-24 DIAGNOSIS — I4891 Unspecified atrial fibrillation: Secondary | ICD-10-CM | POA: Diagnosis not present

## 2021-06-24 DIAGNOSIS — H26491 Other secondary cataract, right eye: Secondary | ICD-10-CM | POA: Diagnosis not present

## 2021-06-24 DIAGNOSIS — R0989 Other specified symptoms and signs involving the circulatory and respiratory systems: Secondary | ICD-10-CM | POA: Diagnosis not present

## 2021-06-25 ENCOUNTER — Other Ambulatory Visit (HOSPITAL_BASED_OUTPATIENT_CLINIC_OR_DEPARTMENT_OTHER): Payer: Self-pay | Admitting: Family Medicine

## 2021-06-25 DIAGNOSIS — I10 Essential (primary) hypertension: Secondary | ICD-10-CM

## 2021-06-25 DIAGNOSIS — R7989 Other specified abnormal findings of blood chemistry: Secondary | ICD-10-CM

## 2021-06-25 DIAGNOSIS — R0989 Other specified symptoms and signs involving the circulatory and respiratory systems: Secondary | ICD-10-CM

## 2021-06-25 DIAGNOSIS — I4891 Unspecified atrial fibrillation: Secondary | ICD-10-CM

## 2021-06-26 ENCOUNTER — Other Ambulatory Visit (HOSPITAL_BASED_OUTPATIENT_CLINIC_OR_DEPARTMENT_OTHER): Payer: Self-pay | Admitting: Family Medicine

## 2021-06-26 ENCOUNTER — Ambulatory Visit (HOSPITAL_BASED_OUTPATIENT_CLINIC_OR_DEPARTMENT_OTHER)
Admission: RE | Admit: 2021-06-26 | Discharge: 2021-06-26 | Disposition: A | Discharge status: Home / Self Care | Payer: PPO | Source: Ambulatory Visit | Attending: Family Medicine | Admitting: Family Medicine

## 2021-06-26 DIAGNOSIS — R7989 Other specified abnormal findings of blood chemistry: Secondary | ICD-10-CM

## 2021-06-26 DIAGNOSIS — I10 Essential (primary) hypertension: Secondary | ICD-10-CM | POA: Diagnosis not present

## 2021-06-26 DIAGNOSIS — R0989 Other specified symptoms and signs involving the circulatory and respiratory systems: Secondary | ICD-10-CM

## 2021-06-26 DIAGNOSIS — Z9889 Other specified postprocedural states: Secondary | ICD-10-CM | POA: Insufficient documentation

## 2021-06-26 DIAGNOSIS — R791 Abnormal coagulation profile: Secondary | ICD-10-CM | POA: Insufficient documentation

## 2021-06-26 DIAGNOSIS — I4891 Unspecified atrial fibrillation: Secondary | ICD-10-CM

## 2021-06-27 ENCOUNTER — Encounter: Payer: Self-pay | Admitting: Podiatry

## 2021-06-29 NOTE — Telephone Encounter (Signed)
Please advise 

## 2021-06-30 ENCOUNTER — Telehealth: Payer: Self-pay

## 2021-06-30 ENCOUNTER — Encounter: Payer: Self-pay | Admitting: Cardiology

## 2021-06-30 NOTE — Telephone Encounter (Signed)
Called patient and moved her appointment up to 07/02/21 at 8:00 am per Dr. Joya Gaskins recommendation. ?

## 2021-07-02 ENCOUNTER — Encounter: Payer: Self-pay | Admitting: Cardiology

## 2021-07-02 ENCOUNTER — Ambulatory Visit: Payer: PPO | Admitting: Cardiology

## 2021-07-02 VITALS — BP 142/82 | HR 48 | Ht 69.0 in | Wt 209.0 lb

## 2021-07-02 DIAGNOSIS — I119 Hypertensive heart disease without heart failure: Secondary | ICD-10-CM

## 2021-07-02 DIAGNOSIS — E7800 Pure hypercholesterolemia, unspecified: Secondary | ICD-10-CM

## 2021-07-02 DIAGNOSIS — Z7901 Long term (current) use of anticoagulants: Secondary | ICD-10-CM | POA: Diagnosis not present

## 2021-07-02 DIAGNOSIS — I480 Paroxysmal atrial fibrillation: Secondary | ICD-10-CM | POA: Diagnosis not present

## 2021-07-02 DIAGNOSIS — R079 Chest pain, unspecified: Secondary | ICD-10-CM

## 2021-07-02 NOTE — Patient Instructions (Addendum)
Medication Instructions:  ?Your physician recommends that you continue on your current medications as directed. Please refer to the Current Medication list given to you today. ? ?*If you need a refill on your cardiac medications before your next appointment, please call your pharmacy* ? ? ?Lab Work: ?Your physician recommends that you return for lab work in:  ? ?Labs today in [address]: CMP, Lipids ?Labs 1 week before CT: BMP ? ?If you have labs (blood work) drawn today and your tests are completely normal, you will receive your results only by: ?MyChart Message (if you have MyChart) OR ?A paper copy in the mail ?If you have any lab test that is abnormal or we need to change your treatment, we will call you to review the results. ? ? ?Testing/Procedures: ? ? ?Your cardiac CT will be scheduled at one of the below locations:  ? ?[facility] ?[address] ?[place], [place] [address] ?[phone] ? ?OR ? ?[place] ?[address] ?[address] ?[place], [place] [address] ?[phone] ? ?If scheduled at [facility], please arrive at the [facility] and Children's Entrance (Entrance C2) of [facility] 30 minutes prior to test start time. ?You can use the FREE valet parking offered at entrance C (encouraged to control the heart rate for the test)  ?Proceed to the [facility] Radiology Department (first floor) to check-in and test prep. ? ?All radiology patients and guests should use entrance C2 at [facility], accessed from [facility], even though the hospital's physical address listed is [address]. ? ? ? ?If scheduled at [facility], please arrive 15 mins early for check-in and test prep. ? ?Please follow these instructions carefully (unless otherwise directed): ? ?On the Night Before the Test: ?Be sure to Drink plenty of water. ?Do not consume any caffeinated/decaffeinated beverages or chocolate 12  hours prior to your test. ?Do not take any antihistamines 12 hours prior to your test. ? ?On the Day of the Test: ?Drink plenty of water until 1 hour prior to the test. ?Do not eat any food 4 hours prior to the test. ?You may take your regular medications prior to the test.  ?Take metoprolol (Lopressor) two hours prior to test. ?FEMALES- please wear underwire-free bra if available, avoid dresses & tight clothing ? ?     ?After the Test: ?Drink plenty of water. ?After receiving IV contrast, you may experience a mild flushed feeling. This is normal. ?On occasion, you may experience a mild rash up to 24 hours after the test. This is not dangerous. If this occurs, you can take Benadryl 25 mg and increase your fluid intake. ?If you experience trouble breathing, this can be serious. If it is severe call 911 IMMEDIATELY. If it is mild, please call our office. ? ?We will call to schedule your test 2-4 weeks out understanding that some insurance companies will need an authorization prior to the service being performed.  ? ?For non-scheduling related questions, please contact the cardiac imaging nurse navigator should you have any questions/concerns: ?Marchia Bond, Cardiac Imaging Nurse Navigator ?Gordy Clement, Cardiac Imaging Nurse Navigator ?[facility] ?Direct Office Dial: [phone]  ? ?For scheduling needs, including cancellations and rescheduling, please call [place], [phone]. ? ? ? ?Follow-Up: ?At [facility], you and your health needs are our priority.  As part of our continuing mission to provide you with exceptional heart care, we have created designated  Provider Care Teams.  These Care Teams include your primary Cardiologist (physician) and Advanced Practice Providers (APPs -  Physician Assistants and Nurse Practitioners) who all work together to provide you with the care you need, when you need it. ? ?We recommend signing up for the patient portal called "MyChart".  Sign up  information is provided on this After Visit Summary.  MyChart is used to connect with patients for Virtual Visits (Telemedicine).  Patients are able to view lab/test results, encounter notes, upcoming appointments, etc.  Non-urgent messages can be sent to your provider as well.   ?To learn more about what you can do with MyChart, go to [email or web address].   ? ?Your next appointment:   ?8 week(s) ? ?The format for your next appointment:   ?In Person ? ?Provider:   ?Shirlee More, MD  ? ? ?Other Instructions ?None ? ?Important Information About Sugar ? ? ? ? ? ? ?

## 2021-07-02 NOTE — Progress Notes (Signed)
?Cardiology Office Note:   ? ?Date:  07/02/2021  ? ?ID:  JOELI Mcpherson, DOB [birth year], MRN [id number] ? ?PCP:  Katherine Congress, NP  ?Cardiologist:  Katherine More, MD   ? ?Referring MD: Katherine Mcpherson, Katherine Mcpherson, *  ? ? ?ASSESSMENT:   ? ?1. Exertional chest pain   ?2. Paroxysmal atrial fibrillation (HCC)   ?3. Long term current use of anticoagulant therapy   ?4. Hypertensive heart disease without heart failure   ?5. High cholesterol   ?6. Chest pain, unspecified type   ? ?PLAN:   ? ?In order of problems listed above: ? ?Her clinical problem sounds like typical angina exertional chest tightness shortness of breath relieved with rest I agree with her knee she needs a CTA but I think she is best served with a cardiac CTA protocol but we can also look for findings of pulmonary embolism.  Unfortunately a D-dimer by itself although sensitive has poor predictive accuracy for pulmonary embolism and I think we can wait and do the CTA cardiac profile. ?Having no further episodes of atrial fibrillation and she is anticoagulated putting her at low risk of pulmonary thromboembolism ?Blood pressure is very well controlled except for 1 episode in the ophthalmology office having a procedure done I would not change her current treatment I would consider continue lisinopril and her beta-blocker ?Continue her nonstatin and I will check a lipid profile ? ? ?Next appointment: 6 to 8 weeks ? ? ?Medication Adjustments/Labs and Tests Ordered: ?Current medicines are reviewed at length with the patient today.  Concerns regarding medicines are outlined above.  ?No orders of the defined types were placed in this encounter. ? ?No orders of the defined types were placed in this encounter. ? ? ?Chief Complaint  ?Patient presents with  ? Follow-up  ? Atrial Fibrillation  ? ? ?History of Present Illness:   ? ?Katherine Mcpherson is a 76 y.o. female with a hx of paroxysmal atrial fibrillation anticoagulation and hypertensive heart disease without  heart failure with stage III CKD and hyperlipidemia last seen 08/22/2020. ? ?Compliance with diet, lifestyle and medications: Yes ? ?Is a very complicated issue ?She continues to have back pain although she is improving ?She complained of shortness of breath and pleuritic chest pain D-dimer is elevated and it seems as if she was advised CTA pulmonary embolism protocol ?When she speaks to me she tells me her predominant problem is when she walks quickly with a friend she gets tightness to her chest relieved with rest the pattern is stable is most concerning for angina ?She is iron deficient but her recent hemoglobin is 12.6 ?She is not having shortness of breath at rest other than exertion no edema no history of thrombophlebitis ?She tells me that she wants me to make a decision if and what type of CT she has ?She has had no episodes of atrial fibrillation ?I reviewed her blood pressure log very well controlled except for 1 day when she was in the ophthalmology office systolic greater then 106 ?Past Medical History:  ?Diagnosis Date  ? Allergy   ? Anemia   ? "comes and goes" (01/22/2013)  ? Aortic regurgitation 07/21/2018  ? Arthritis   ? "qwhere" (01/22/2013)  ? Chronic lower back pain   ? "ongoing since back OR 2013" (01/22/2013)  ? Fall from slip, trip, or stumble 01/11/2013  ? "stubbed toe at gas pump at [place]" (01/22/2013)  ? Gastroesophageal reflux disease 08/30/2016  ? Seen by Dr. Juanita Mcpherson  08/17/2016  ? GERD (gastroesophageal reflux disease)   ? H/O hiatal hernia   ? High cholesterol   ? Hypertension   ? Hypertensive heart disease 12/30/2011  ? Long term current use of anticoagulant therapy 09/10/2016  ? Low back pain 12/15/2011  ? Dr. Hoyle Barr  LBP with RLE pain with L5-S1 spondyiolithesis and scoliosis.   ? Menopausal state 12/30/2011  ? Obesity, Class I, BMI 30-34.9 12/30/2011  ? Paroxysmal atrial fibrillation ([place]) 09/10/2016  ? Polyp of colon 10/2009  ? PONV (postoperative nausea and vomiting)   ? Pure  hypercholesterolemia 09/10/2016  ? Torn rotator cuff 03/13/2019  ? Trochanteric bursitis of left hip 09/10/2016  ? Vaginal atrophy 12/30/2011  ? ? ?Past Surgical History:  ?Procedure Laterality Date  ? ABDOMINAL HYSTERECTOMY  1999  ? TAH/BSO  ? ANKLE FRACTURE SURGERY Right 1985  ? ANKLE HARDWARE REMOVAL Right 1986  ? COLONOSCOPY  08/2007  ? TUBULAR ADENOMAWITH DYSPLASIA  ? FOOT FRACTURE SURGERY Left 1986  ? "piece of bone removed" (08/03/2012)  ? FRACTURE SURGERY Bilateral   ? HAMMER TOE SURGERY Right 2002  ? JOINT REPLACEMENT    ? KNEE ARTHROSCOPY Left 2001  ? POSTERIOR LUMBAR FUSION  2013  ? L 5-S1  ? RADIOFREQUENCY ABLATION NERVES    ? 6 week ago 07/31/14  ? SPINE SURGERY  01/2012  ? TOTAL KNEE ARTHROPLASTY Right 07/31/2012  ? Procedure: TOTAL KNEE ARTHROPLASTY;  Surgeon: Kerin Salen, MD;  Location: [facility];  Service: Orthopedics;  Laterality: Right;  DEPUY/SIGMA  ? TOTAL KNEE ARTHROPLASTY Left 01/22/2013  ? TOTAL KNEE ARTHROPLASTY Left 01/22/2013  ? Procedure: LEFT TOTAL KNEE ARTHROPLASTY;  Surgeon: Kerin Salen, MD;  Location: [facility];  Service: Orthopedics;  Laterality: Left;  ? ? ?Current Medications: ?Current Meds  ?Medication Sig  ? acetaminophen (TYLENOL) 650 MG CR tablet Take 650 mg by mouth 2 (two) times daily as needed for pain.  ? carisoprodol (SOMA) 350 MG tablet Take 350 mg by mouth 3 (three) times daily as needed for muscle spasms.  ? ELIQUIS 5 MG TABS tablet Take 1 tablet by mouth twice a day  ? ezetimibe (ZETIA) 10 MG tablet Take 1 tablet by mouth daily  ? famotidine (PEPCID) 20 MG tablet Take 20 mg by mouth daily as needed for heartburn.  ? fluticasone (FLONASE) 50 MCG/ACT nasal spray Place 1 spray into both nostrils daily as needed for allergies or rhinitis.  ? lisinopril (ZESTRIL) 5 MG tablet Take 1 tablet (5 mg total) by mouth daily.  ? loratadine (CLARITIN) 10 MG tablet Take 10 mg by mouth daily as needed for allergies.  ? metoprolol tartrate (LOPRESSOR) 25 MG tablet Take 0.5 tablets (12.5 mg  total) by mouth 2 (two) times daily.  ?  ? ?Allergies:   Sulfa antibiotics, Penicillins, and Latex  ? ?Social History  ? ?Socioeconomic History  ? Marital status: Single  ?  Spouse name: Not on file  ? Number of children: Not on file  ? Years of education: Not on file  ? Highest education level: Not on file  ?Occupational History  ? Occupation: retired  ?Tobacco Use  ? Smoking status: Former  ?  Packs/day: 0.50  ?  Years: 8.00  ?  Pack years: 4.00  ?  Types: Cigarettes  ?  Quit date: 03/15/1972  ?  Years since quitting: 49.3  ?  Passive exposure: Past  ? Smokeless tobacco: Never  ?Vaping Use  ? Vaping Use: Never used  ?Substance  and Sexual Activity  ? Alcohol use: Yes  ?  Comment: occ/ rare wine-2 glasses of wine per month  ? Drug use: No  ? Sexual activity: Never  ?Other Topics Concern  ? Not on file  ?Social History Narrative  ? Exercise riding bike at [employer] and walking  ? ?Social Determinants of Health  ? ?Financial Resource Strain: Not on file  ?Food Insecurity: Not on file  ?Transportation Needs: Not on file  ?Physical Activity: Not on file  ?Stress: Not on file  ?Social Connections: Not on file  ?  ? ?Family History: ?The patient's family history includes Cancer in her paternal uncle; Heart disease in her father; Hyperlipidemia in her father; Hypertension in her father, mother, sister, and sister; Osteoporosis in her mother. ?ROS:   ?Please see the history of present illness.    ?All other systems reviewed and are negative. ? ?EKGs/Labs/Other Studies Reviewed:   ? ?The following studies were reviewed today: ? ?EKG:  EKG ordered today and personally reviewed.  The ekg ordered today demonstrates sinus bradycardia 45 bpm first-degree AV block.  Poor R wave progression she will continue same beta-blocker give good images for CTA ? ?Recent Labs: ?08/22/2020: ALT 14; BUN 44; Creatinine, Ser 1.39; Potassium 5.1; Sodium 139  ?Recent Lipid Panel ?   ?Component Value Date/Time  ? CHOL 231 (H) 08/22/2020 0846  ?  TRIG 96 08/22/2020 0846  ? HDL 73 08/22/2020 0846  ? CHOLHDL 3.2 08/22/2020 0846  ? CHOLHDL 3.0 08/25/2015 0853  ? VLDL 15 08/25/2015 0853  ? [place] 141 (H) 08/22/2020 0846  ? ? ?Physical Exam:   ? ?VS:  BP (!) 1

## 2021-07-02 NOTE — Telephone Encounter (Signed)
Her EKG showed poor R wave progression not infarction ? ?She is scheduled to undergo cardiac CTA ?

## 2021-07-03 ENCOUNTER — Encounter: Payer: Self-pay | Admitting: Cardiology

## 2021-07-03 ENCOUNTER — Other Ambulatory Visit: Payer: Self-pay

## 2021-07-03 ENCOUNTER — Telehealth: Payer: Self-pay

## 2021-07-03 DIAGNOSIS — Z981 Arthrodesis status: Secondary | ICD-10-CM | POA: Diagnosis not present

## 2021-07-03 DIAGNOSIS — M545 Low back pain, unspecified: Secondary | ICD-10-CM | POA: Diagnosis not present

## 2021-07-03 LAB — LIPID PANEL
Chol/HDL Ratio: 3 ratio (ref 0.0–4.4)
Cholesterol, Total: 216 mg/dL — ABNORMAL HIGH (ref 100–199)
HDL: 73 mg/dL (ref >39)
LDL Chol Calc (NIH): 129 mg/dL — ABNORMAL HIGH (ref 0–99)
Triglycerides: 80 mg/dL (ref 0–149)
VLDL Cholesterol Cal: 14 mg/dL (ref 5–40)

## 2021-07-03 LAB — COMPREHENSIVE METABOLIC PANEL
ALT: 12 IU/L (ref 0–32)
AST: 16 IU/L (ref 0–40)
Albumin/Globulin Ratio: 2 (ref 1.2–2.2)
Albumin: 4.5 g/dL (ref 3.7–4.7)
Alkaline Phosphatase: 98 IU/L (ref 44–121)
BUN/Creatinine Ratio: 28 (ref 12–28)
BUN: 36 mg/dL — ABNORMAL HIGH (ref 8–27)
Bilirubin Total: 0.3 mg/dL (ref 0.0–1.2)
CO2: 20 mmol/L (ref 20–29)
Calcium: 9.4 mg/dL (ref 8.7–10.3)
Chloride: 105 mmol/L (ref 96–106)
Creatinine, Ser: 1.28 mg/dL — ABNORMAL HIGH (ref 0.57–1.00)
Globulin, Total: 2.2 g/dL (ref 1.5–4.5)
Glucose: 87 mg/dL (ref 70–99)
Potassium: 5.5 mmol/L — ABNORMAL HIGH (ref 3.5–5.2)
Sodium: 140 mmol/L (ref 134–144)
Total Protein: 6.7 g/dL (ref 6.0–8.5)
eGFR: 43 mL/min/{1.73_m2} — ABNORMAL LOW (ref >59)

## 2021-07-03 NOTE — Telephone Encounter (Signed)
Patient sent message regarding the cholesterol results and why they had not resulted.. She looked again after a little while and they had resulted. She apologized for sending the question and had no further questions about this time. ?

## 2021-07-09 ENCOUNTER — Other Ambulatory Visit: Payer: Self-pay

## 2021-07-09 DIAGNOSIS — R079 Chest pain, unspecified: Secondary | ICD-10-CM

## 2021-07-09 DIAGNOSIS — Z7901 Long term (current) use of anticoagulants: Secondary | ICD-10-CM | POA: Diagnosis not present

## 2021-07-09 DIAGNOSIS — I119 Hypertensive heart disease without heart failure: Secondary | ICD-10-CM

## 2021-07-09 DIAGNOSIS — E7800 Pure hypercholesterolemia, unspecified: Secondary | ICD-10-CM | POA: Diagnosis not present

## 2021-07-09 DIAGNOSIS — I480 Paroxysmal atrial fibrillation: Secondary | ICD-10-CM

## 2021-07-10 LAB — BASIC METABOLIC PANEL
BUN/Creatinine Ratio: 30 — ABNORMAL HIGH (ref 12–28)
BUN: 27 mg/dL (ref 8–27)
CO2: 22 mmol/L (ref 20–29)
Calcium: 9.4 mg/dL (ref 8.7–10.3)
Chloride: 106 mmol/L (ref 96–106)
Creatinine, Ser: 0.91 mg/dL (ref 0.57–1.00)
Glucose: 89 mg/dL (ref 70–99)
Potassium: 4.9 mmol/L (ref 3.5–5.2)
Sodium: 140 mmol/L (ref 134–144)
eGFR: 65 mL/min/{1.73_m2} (ref >59)

## 2021-07-12 DIAGNOSIS — S6991XA Unspecified injury of right wrist, hand and finger(s), initial encounter: Secondary | ICD-10-CM | POA: Diagnosis not present

## 2021-07-14 DIAGNOSIS — M545 Low back pain, unspecified: Secondary | ICD-10-CM | POA: Diagnosis not present

## 2021-07-14 DIAGNOSIS — Z981 Arthrodesis status: Secondary | ICD-10-CM | POA: Diagnosis not present

## 2021-07-15 ENCOUNTER — Telehealth (HOSPITAL_COMMUNITY): Payer: Self-pay | Admitting: *Deleted

## 2021-07-15 NOTE — Telephone Encounter (Signed)
Reaching out to patient to offer assistance regarding upcoming cardiac imaging study; pt verbalizes understanding of appt date/time, parking situation and where to check in, pre-test NPO status, and verified current allergies; name and call back number provided for further questions should they arise ? ?Gordy Clement RN Navigator Cardiac Imaging ?[facility] ?[phone] office ?[phone] cell ? ?Patient to take her AM metoprolol tartrate and is aware to arrive at 11am. ?

## 2021-07-16 ENCOUNTER — Ambulatory Visit (HOSPITAL_COMMUNITY)
Admission: RE | Admit: 2021-07-16 | Discharge: 2021-07-16 | Disposition: A | Discharge status: Home / Self Care | Payer: PPO | Source: Ambulatory Visit | Attending: Cardiology | Admitting: Cardiology

## 2021-07-16 ENCOUNTER — Ambulatory Visit: Payer: PPO | Admitting: Cardiology

## 2021-07-16 ENCOUNTER — Encounter (HOSPITAL_COMMUNITY): Payer: Self-pay

## 2021-07-16 DIAGNOSIS — R079 Chest pain, unspecified: Secondary | ICD-10-CM | POA: Insufficient documentation

## 2021-07-16 DIAGNOSIS — I25119 Atherosclerotic heart disease of native coronary artery with unspecified angina pectoris: Secondary | ICD-10-CM | POA: Insufficient documentation

## 2021-07-16 MED ORDER — NITROGLYCERIN 0.4 MG SL SUBL
0.8000 mg | SUBLINGUAL_TABLET | Freq: Once | SUBLINGUAL | Status: AC
Start: 2021-07-16 — End: 2021-07-16
  Administered 2021-07-16: 0.8 mg via SUBLINGUAL

## 2021-07-16 MED ORDER — IOHEXOL 350 MG/ML SOLN
100.0000 mL | Freq: Once | INTRAVENOUS | Status: AC | PRN
  Administered 2021-07-16: 100 mL via INTRAVENOUS

## 2021-07-16 MED ORDER — NITROGLYCERIN 0.4 MG SL SUBL
SUBLINGUAL_TABLET | SUBLINGUAL | Status: AC
Start: 2021-07-16 — End: 2021-07-16
  Filled 2021-07-16: qty 2

## 2021-07-23 DIAGNOSIS — M545 Low back pain, unspecified: Secondary | ICD-10-CM | POA: Diagnosis not present

## 2021-07-23 DIAGNOSIS — Z981 Arthrodesis status: Secondary | ICD-10-CM | POA: Diagnosis not present

## 2021-07-28 DIAGNOSIS — M545 Low back pain, unspecified: Secondary | ICD-10-CM | POA: Diagnosis not present

## 2021-07-28 DIAGNOSIS — Z981 Arthrodesis status: Secondary | ICD-10-CM | POA: Diagnosis not present

## 2021-08-05 DIAGNOSIS — M545 Low back pain, unspecified: Secondary | ICD-10-CM | POA: Diagnosis not present

## 2021-08-05 DIAGNOSIS — Z981 Arthrodesis status: Secondary | ICD-10-CM | POA: Diagnosis not present

## 2021-08-18 DIAGNOSIS — M545 Low back pain, unspecified: Secondary | ICD-10-CM | POA: Diagnosis not present

## 2021-08-18 DIAGNOSIS — Z981 Arthrodesis status: Secondary | ICD-10-CM | POA: Diagnosis not present

## 2021-08-25 ENCOUNTER — Ambulatory Visit: Payer: PPO | Admitting: Cardiology

## 2021-08-25 VITALS — BP 168/92 | HR 60 | Ht 69.0 in | Wt 214.1 lb

## 2021-08-25 DIAGNOSIS — I119 Hypertensive heart disease without heart failure: Secondary | ICD-10-CM | POA: Diagnosis not present

## 2021-08-25 DIAGNOSIS — Z7901 Long term (current) use of anticoagulants: Secondary | ICD-10-CM

## 2021-08-25 DIAGNOSIS — I480 Paroxysmal atrial fibrillation: Secondary | ICD-10-CM

## 2021-08-25 DIAGNOSIS — R079 Chest pain, unspecified: Secondary | ICD-10-CM | POA: Diagnosis not present

## 2021-08-25 NOTE — Progress Notes (Unsigned)
Cardiology Office Note:    Date:  08/25/2021   ID:  Katherine Mcpherson, DOB 17-Jun-1945, MRN [id number]  PCP:  Faustino Congress, NP  Cardiologist:  Shirlee More, MD    Referring MD: Faustino Congress, NP    ASSESSMENT:    1. Chest pain, unspecified type   2. Paroxysmal atrial fibrillation (HCC)   3. Long term current use of anticoagulant therapy   4. Hypertensive heart disease without heart failure    PLAN:    In order of problems listed above:  Overall her coronary CTA was reassuring minimal atherosclerosis very low calcium score I do not think she is having typical angina I agree with her that it is related to postoperative pain after extensive spine surgery Continue anticoagulant Continue ACE inhibitor for hypertension Continue low-dose Zetia   Next appointment: 1 year   Medication Adjustments/Labs and Tests Ordered: Current medicines are reviewed at length with the patient today.  Concerns regarding medicines are outlined above.  No orders of the defined types were placed in this encounter.  No orders of the defined types were placed in this encounter.   Chief Complaint  Patient presents with   Follow-up    After cardiac CTA    History of Present Illness:    Katherine Mcpherson is a 76 y.o. female with a hx of paroxysmal atrial fibrillation with anticoagulation hypertension hyperlipidemia last seen 07/02/2021 for chest pain.  Results were favorable she had a very low calcium score 1.37 and minimal plaque in the LAD less than 25%.  Over read showed several pulmonary nodules stable over 6 months.  Compliance with diet, lifestyle and medications: Yes  She is steadily improving after extensive spine surgery Her chest discomfort is associated with postoperative pain and I agree with her She tolerates a low-dose of a nonstatin we will continue the same her coronary calcium score was very low. Past Medical History:  Diagnosis Date   Allergy    Anemia    "comes  and goes" (01/22/2013)   Aortic regurgitation 07/21/2018   Arthritis    "qwhere" (01/22/2013)   Chronic lower back pain    "ongoing since back OR 2013" (01/22/2013)   Fall from slip, trip, or stumble 01/11/2013   "stubbed toe at gas pump at [place]" (01/22/2013)   Gastroesophageal reflux disease 08/30/2016   Seen by Dr. [name] 08/17/2016   GERD (gastroesophageal reflux disease)    H/O hiatal hernia    High cholesterol    Hypertension    Hypertensive heart disease 12/30/2011   Long term current use of anticoagulant therapy 09/10/2016   Low back pain 12/15/2011   Dr. Hoyle Barr  LBP with RLE pain with L5-S1 spondyiolithesis and scoliosis.    Menopausal state 12/30/2011   Obesity, Class I, BMI 30-34.9 12/30/2011   Paroxysmal atrial fibrillation ([place]) 09/10/2016   Polyp of colon 10/2009   PONV (postoperative nausea and vomiting)    Pure hypercholesterolemia 09/10/2016   Torn rotator cuff 03/13/2019   Trochanteric bursitis of left hip 09/10/2016   Vaginal atrophy 12/30/2011    Past Surgical History:  Procedure Laterality Date   ABDOMINAL HYSTERECTOMY  1999   TAH/BSO   ANKLE FRACTURE SURGERY Right 1985   ANKLE HARDWARE REMOVAL Right 1986   COLONOSCOPY  08/2007   TUBULAR ADENOMAWITH DYSPLASIA   FOOT FRACTURE SURGERY Left 1986   "piece of bone removed" (08/03/2012)   FRACTURE SURGERY Bilateral    HAMMER TOE SURGERY Right 2002   JOINT REPLACEMENT  KNEE ARTHROSCOPY Left 2001   POSTERIOR LUMBAR FUSION  2013   L 5-S1   RADIOFREQUENCY ABLATION NERVES     6 week ago 07/31/14   SPINE SURGERY  01/2012   TOTAL KNEE ARTHROPLASTY Right 07/31/2012   Procedure: TOTAL KNEE ARTHROPLASTY;  Surgeon: Kerin Salen, MD;  Location: [facility];  Service: Orthopedics;  Laterality: Right;  DEPUY/SIGMA   TOTAL KNEE ARTHROPLASTY Left 01/22/2013   TOTAL KNEE ARTHROPLASTY Left 01/22/2013   Procedure: LEFT TOTAL KNEE ARTHROPLASTY;  Surgeon: Kerin Salen, MD;  Location: [facility];  Service: Orthopedics;   Laterality: Left;    Current Medications: Current Meds  Medication Sig   acetaminophen (TYLENOL) 650 MG CR tablet Take 650 mg by mouth 2 (two) times daily as needed for pain.   carisoprodol (SOMA) 350 MG tablet Take 350 mg by mouth 3 (three) times daily as needed for muscle spasms.   ELIQUIS 5 MG TABS tablet Take 1 tablet by mouth twice a day   ezetimibe (ZETIA) 10 MG tablet Take 1 tablet by mouth daily (Patient taking differently: Take 10 mg by mouth. Takes 0.5 tablet ( 5 mg ) daily)   famotidine (PEPCID) 20 MG tablet Take 20 mg by mouth daily as needed for heartburn.   fluticasone (FLONASE) 50 MCG/ACT nasal spray Place 1 spray into both nostrils daily as needed for allergies or rhinitis.   lisinopril (ZESTRIL) 5 MG tablet Take 1 tablet (5 mg total) by mouth daily.   loratadine (CLARITIN) 10 MG tablet Take 10 mg by mouth daily as needed for allergies.   metoprolol tartrate (LOPRESSOR) 25 MG tablet Take 0.5 tablets (12.5 mg total) by mouth 2 (two) times daily.     Allergies:   Sulfa antibiotics, Penicillins, Celecoxib, Tape, Latex, Nsaids, and Povidone-iodine   Social History   Socioeconomic History   Marital status: Single    Spouse name: Not on file   Number of children: Not on file   Years of education: Not on file   Highest education level: Not on file  Occupational History   Occupation: retired  Tobacco Use   Smoking status: Former    Packs/day: 0.50    Years: 8.00    Total pack years: 4.00    Types: Cigarettes    Quit date: 03/15/1972    Years since quitting: 49.4    Passive exposure: Past   Smokeless tobacco: Never  Vaping Use   Vaping Use: Never used  Substance and Sexual Activity   Alcohol use: Yes    Comment: occ/ rare wine-2 glasses of wine per month   Drug use: No   Sexual activity: Never  Other Topics Concern   Not on file  Social History Narrative   Exercise riding bike at [employer] and walking   Social Determinants of Health   Financial Resource  Strain: Not on file  Food Insecurity: Not on file  Transportation Needs: Not on file  Physical Activity: Not on file  Stress: Not on file  Social Connections: Not on file     Family History: The patient's family history includes Cancer in her paternal uncle; Heart disease in her father; Hyperlipidemia in her father; Hypertension in her father, mother, sister, and sister; Osteoporosis in her mother. ROS:   Please see the history of present illness.    All other systems reviewed and are negative.  EKGs/Labs/Other Studies Reviewed:    The following studies were reviewed today:    Recent Labs: 07/02/2021: ALT 12 07/09/2021: BUN  27; Creatinine, Ser 0.91; Potassium 4.9; Sodium 140  Recent Lipid Panel    Component Value Date/Time   CHOL 216 (H) 07/02/2021 0916   TRIG 80 07/02/2021 0916   HDL 73 07/02/2021 0916   CHOLHDL 3.0 07/02/2021 0916   CHOLHDL 3.0 08/25/2015 0853   VLDL 15 08/25/2015 0853   LDLCALC 129 (H) 07/02/2021 0916    Physical Exam:    VS:  BP (!) 168/92 (BP Location: Left Arm, Patient Position: Sitting)   Pulse 60   Ht '5\' 9"'$  (1.753 m)   Wt 214 lb 1.9 oz (97.1 kg)   SpO2 98%   BMI 31.62 kg/m     Wt Readings from Last 3 Encounters:  08/25/21 214 lb 1.9 oz (97.1 kg)  07/02/21 209 lb (94.8 kg)  08/22/20 226 lb (102.5 kg)     GEN:  Well nourished, well developed in no acute distress HEENT: Normal NECK: No JVD; No carotid bruits LYMPHATICS: No lymphadenopathy CARDIAC: RRR, no murmurs, rubs, gallops RESPIRATORY:  Clear to auscultation without rales, wheezing or rhonchi  ABDOMEN: Soft, non-tender, non-distended MUSCULOSKELETAL:  No edema; No deformity  SKIN: Warm and dry NEUROLOGIC:  Alert and oriented x 3 PSYCHIATRIC:  Normal affect    Signed, Shirlee More, MD  08/25/2021 4:39 PM    [place]

## 2021-08-25 NOTE — Patient Instructions (Signed)

## 2021-09-08 ENCOUNTER — Other Ambulatory Visit: Payer: Self-pay | Admitting: Cardiology

## 2021-09-08 DIAGNOSIS — I480 Paroxysmal atrial fibrillation: Secondary | ICD-10-CM

## 2021-09-08 NOTE — Telephone Encounter (Signed)
Eliquis 5mg  refill request received. Patient is 76 years old, weight-97.1kg, Crea-0.91 on 07/09/2021, Diagnosis-Afib, and last seen by Dr. Dulce Sellar on 08/25/2021. Dose is appropriate based on dosing criteria. Will send in refill to requested pharmacy.

## 2021-10-01 ENCOUNTER — Telehealth: Payer: Self-pay | Admitting: Cardiology

## 2021-10-01 ENCOUNTER — Other Ambulatory Visit: Payer: Self-pay

## 2021-10-01 DIAGNOSIS — I10 Essential (primary) hypertension: Secondary | ICD-10-CM

## 2021-10-01 DIAGNOSIS — I130 Hypertensive heart and chronic kidney disease with heart failure and stage 1 through stage 4 chronic kidney disease, or unspecified chronic kidney disease: Secondary | ICD-10-CM

## 2021-10-01 DIAGNOSIS — I119 Hypertensive heart disease without heart failure: Secondary | ICD-10-CM

## 2021-10-01 DIAGNOSIS — I480 Paroxysmal atrial fibrillation: Secondary | ICD-10-CM

## 2021-10-01 DIAGNOSIS — Z7901 Long term (current) use of anticoagulants: Secondary | ICD-10-CM

## 2021-10-01 MED ORDER — LISINOPRIL 5 MG PO TABS: 5.0000 mg | ORAL_TABLET | Freq: Every day | ORAL | 3 refills | Status: DC

## 2021-10-01 MED ORDER — METOPROLOL TARTRATE 25 MG PO TABS: 12.5000 mg | ORAL_TABLET | Freq: Two times a day (BID) | ORAL | 3 refills | Status: DC

## 2021-10-01 NOTE — Telephone Encounter (Signed)
Ref #[id number] ref x 3- metoprolol tartrate (LOPRESSOR) 25 MG tablet, lisinopril (ZESTRIL) 5 MG tablet

## 2021-10-01 NOTE — Telephone Encounter (Signed)
*  STAT* If patient is at the pharmacy, call can be transferred to refill team.   1. Which medications need to be refilled? (please list name of each medication and dose if known)  lisinopril (ZESTRIL) 5 MG tablet metoprolol tartrate (LOPRESSOR) 25 MG tablet  2. Which pharmacy/location (including street and city if local pharmacy) is medication to be sent to?[facility] ([facility] - [place], [place]  3. Do they need a 30 day or 90 day supply? 90 day

## 2021-10-06 ENCOUNTER — Other Ambulatory Visit: Payer: Self-pay | Admitting: Cardiology

## 2021-10-26 ENCOUNTER — Other Ambulatory Visit: Payer: Self-pay

## 2021-10-26 NOTE — Patient Outreach (Signed)
  Care Coordination   Initial Visit Note   10/26/2021 Name: Katherine Mcpherson MRN: [id number] DOB: [birth year]  Katherine Mcpherson is a 76 y.o. year old female who sees Faustino Congress, NP for primary care. I spoke with  Pieter Partridge by phone today  What matters to the patients health and wellness today?  I am doing better since I had my back surgeries and I do not think I need any help at this time    Goals Addressed             This Visit's Progress    COMPLETED: Care Coordination Activities - no follow up required       Care Coordination Interventions: Advised patient to call and schedule Annual Wellness Visit Provided education to patient re: Annual Wellness Visit and care coordination services Reviewed medications with patient and discussed adherence Assessed social determinant of health barriers No further interventions needed         SDOH assessments and interventions completed:  Yes  SDOH Interventions Today    Flowsheet Row Most Recent Value  SDOH Interventions   Food Insecurity Interventions Intervention Not Indicated  Housing Interventions Intervention Not Indicated  Transportation Interventions Intervention Not Indicated        Care Coordination Interventions Activated:  Yes  Care Coordination Interventions:  Yes, provided   Follow up plan: No further intervention required.   Encounter Outcome:  Pt. Visit Completed {THN Tip this will not be part of the note when signed-REQUIRED REPORT FIELD DO NOT DELETE (Optional):[id number] Peter Garter RN, BSN,CCM, [facility] [phone]

## 2021-10-26 NOTE — Patient Instructions (Signed)
Visit Information  Thank you for taking time to visit with me today. Please don't hesitate to contact me if I can be of assistance to you.   Following are the goals we discussed today:   Goals Addressed             This Visit's Progress    COMPLETED: Care Coordination Activities - no follow up required       Care Coordination Interventions: Advised patient to call and schedule Annual Wellness Visit Provided education to patient re: Annual Wellness Visit and care coordination services Reviewed medications with patient and discussed adherence Assessed social determinant of health barriers No further interventions needed          If you are experiencing a Mental Health or [place] or need someone to talk to, please call the Suicide and Crisis Lifeline: 988 call the [place] National Suicide Prevention Lifeline: (857)573-1499 or TTY: 346-270-8406 TTY 518 708 5938) to talk to a trained counselor call 1-800-273-TALK (toll free, 24 hour hotline) go to [facility] [place] [phone]) call 911   Patient verbalizes understanding of instructions and care plan provided today and agrees to view in [place]. Active MyChart status and patient understanding of how to access instructions and care plan via MyChart confirmed with patient.     No further follow up required:    Peter Garter RN, Jackquline [place], [facility] [phone]

## 2021-11-23 DIAGNOSIS — M25532 Pain in left wrist: Secondary | ICD-10-CM | POA: Diagnosis not present

## 2021-11-23 DIAGNOSIS — M26609 Unspecified temporomandibular joint disorder, unspecified side: Secondary | ICD-10-CM | POA: Diagnosis not present

## 2021-11-23 DIAGNOSIS — Z6833 Body mass index (BMI) 33.0-33.9, adult: Secondary | ICD-10-CM | POA: Diagnosis not present

## 2021-11-26 DIAGNOSIS — M654 Radial styloid tenosynovitis [de Quervain]: Secondary | ICD-10-CM | POA: Diagnosis not present

## 2021-11-26 DIAGNOSIS — M25532 Pain in left wrist: Secondary | ICD-10-CM | POA: Diagnosis not present

## 2021-11-27 DIAGNOSIS — M4326 Fusion of spine, lumbar region: Secondary | ICD-10-CM | POA: Diagnosis not present

## 2021-11-27 DIAGNOSIS — Z981 Arthrodesis status: Secondary | ICD-10-CM | POA: Diagnosis not present

## 2021-11-27 DIAGNOSIS — M419 Scoliosis, unspecified: Secondary | ICD-10-CM | POA: Diagnosis not present

## 2021-11-27 DIAGNOSIS — Z09 Encounter for follow-up examination after completed treatment for conditions other than malignant neoplasm: Secondary | ICD-10-CM | POA: Diagnosis not present

## 2021-11-27 DIAGNOSIS — Z9889 Other specified postprocedural states: Secondary | ICD-10-CM | POA: Diagnosis not present

## 2021-11-27 DIAGNOSIS — M4854XA Collapsed vertebra, not elsewhere classified, thoracic region, initial encounter for fracture: Secondary | ICD-10-CM | POA: Diagnosis not present

## 2021-12-24 DIAGNOSIS — M654 Radial styloid tenosynovitis [de Quervain]: Secondary | ICD-10-CM | POA: Diagnosis not present

## 2021-12-30 DIAGNOSIS — Z87891 Personal history of nicotine dependence: Secondary | ICD-10-CM | POA: Diagnosis not present

## 2021-12-30 DIAGNOSIS — Z7901 Long term (current) use of anticoagulants: Secondary | ICD-10-CM | POA: Diagnosis not present

## 2021-12-30 DIAGNOSIS — I480 Paroxysmal atrial fibrillation: Secondary | ICD-10-CM | POA: Diagnosis not present

## 2021-12-30 DIAGNOSIS — I10 Essential (primary) hypertension: Secondary | ICD-10-CM | POA: Diagnosis not present

## 2021-12-30 DIAGNOSIS — Z6831 Body mass index (BMI) 31.0-31.9, adult: Secondary | ICD-10-CM | POA: Diagnosis not present

## 2022-01-19 DIAGNOSIS — K5730 Diverticulosis of large intestine without perforation or abscess without bleeding: Secondary | ICD-10-CM | POA: Diagnosis not present

## 2022-01-19 DIAGNOSIS — R140 Abdominal distension (gaseous): Secondary | ICD-10-CM | POA: Diagnosis not present

## 2022-01-19 DIAGNOSIS — K580 Irritable bowel syndrome with diarrhea: Secondary | ICD-10-CM | POA: Diagnosis not present

## 2022-01-19 DIAGNOSIS — E669 Obesity, unspecified: Secondary | ICD-10-CM | POA: Diagnosis not present

## 2022-01-19 DIAGNOSIS — K219 Gastro-esophageal reflux disease without esophagitis: Secondary | ICD-10-CM | POA: Diagnosis not present

## 2022-01-26 DIAGNOSIS — I1310 Hypertensive heart and chronic kidney disease without heart failure, with stage 1 through stage 4 chronic kidney disease, or unspecified chronic kidney disease: Secondary | ICD-10-CM | POA: Diagnosis not present

## 2022-01-26 DIAGNOSIS — Z6831 Body mass index (BMI) 31.0-31.9, adult: Secondary | ICD-10-CM | POA: Diagnosis not present

## 2022-01-26 DIAGNOSIS — Z87891 Personal history of nicotine dependence: Secondary | ICD-10-CM | POA: Diagnosis not present

## 2022-01-26 DIAGNOSIS — N182 Chronic kidney disease, stage 2 (mild): Secondary | ICD-10-CM | POA: Diagnosis not present

## 2022-01-26 DIAGNOSIS — M48061 Spinal stenosis, lumbar region without neurogenic claudication: Secondary | ICD-10-CM | POA: Diagnosis not present

## 2022-02-09 DIAGNOSIS — Z981 Arthrodesis status: Secondary | ICD-10-CM | POA: Diagnosis not present

## 2022-02-09 DIAGNOSIS — M546 Pain in thoracic spine: Secondary | ICD-10-CM | POA: Diagnosis not present

## 2022-02-09 DIAGNOSIS — R29898 Other symptoms and signs involving the musculoskeletal system: Secondary | ICD-10-CM | POA: Diagnosis not present

## 2022-02-09 DIAGNOSIS — M47812 Spondylosis without myelopathy or radiculopathy, cervical region: Secondary | ICD-10-CM | POA: Diagnosis not present

## 2022-02-09 DIAGNOSIS — M5184 Other intervertebral disc disorders, thoracic region: Secondary | ICD-10-CM | POA: Diagnosis not present

## 2022-02-09 DIAGNOSIS — M545 Low back pain, unspecified: Secondary | ICD-10-CM | POA: Diagnosis not present

## 2022-02-09 DIAGNOSIS — M419 Scoliosis, unspecified: Secondary | ICD-10-CM | POA: Diagnosis not present

## 2022-02-09 DIAGNOSIS — M47814 Spondylosis without myelopathy or radiculopathy, thoracic region: Secondary | ICD-10-CM | POA: Diagnosis not present

## 2022-02-12 DIAGNOSIS — M546 Pain in thoracic spine: Secondary | ICD-10-CM | POA: Diagnosis not present

## 2022-02-12 DIAGNOSIS — G8929 Other chronic pain: Secondary | ICD-10-CM | POA: Diagnosis not present

## 2022-02-12 DIAGNOSIS — M533 Sacrococcygeal disorders, not elsewhere classified: Secondary | ICD-10-CM | POA: Diagnosis not present

## 2022-02-12 DIAGNOSIS — M7918 Myalgia, other site: Secondary | ICD-10-CM | POA: Diagnosis not present

## 2022-02-12 DIAGNOSIS — Z981 Arthrodesis status: Secondary | ICD-10-CM | POA: Diagnosis not present

## 2022-02-12 DIAGNOSIS — M545 Low back pain, unspecified: Secondary | ICD-10-CM | POA: Diagnosis not present

## 2022-02-25 DIAGNOSIS — Z981 Arthrodesis status: Secondary | ICD-10-CM | POA: Diagnosis not present

## 2022-02-25 DIAGNOSIS — M545 Low back pain, unspecified: Secondary | ICD-10-CM | POA: Diagnosis not present

## 2022-02-25 DIAGNOSIS — M546 Pain in thoracic spine: Secondary | ICD-10-CM | POA: Diagnosis not present

## 2022-02-26 DIAGNOSIS — M533 Sacrococcygeal disorders, not elsewhere classified: Secondary | ICD-10-CM | POA: Diagnosis not present

## 2022-03-19 DIAGNOSIS — M546 Pain in thoracic spine: Secondary | ICD-10-CM | POA: Diagnosis not present

## 2022-03-19 DIAGNOSIS — Z981 Arthrodesis status: Secondary | ICD-10-CM | POA: Diagnosis not present

## 2022-03-19 DIAGNOSIS — M545 Low back pain, unspecified: Secondary | ICD-10-CM | POA: Diagnosis not present

## 2022-03-31 ENCOUNTER — Other Ambulatory Visit: Payer: Self-pay | Admitting: Family Medicine

## 2022-03-31 DIAGNOSIS — Z1231 Encounter for screening mammogram for malignant neoplasm of breast: Secondary | ICD-10-CM

## 2022-03-31 DIAGNOSIS — M79672 Pain in left foot: Secondary | ICD-10-CM | POA: Diagnosis not present

## 2022-04-02 DIAGNOSIS — M546 Pain in thoracic spine: Secondary | ICD-10-CM | POA: Diagnosis not present

## 2022-04-02 DIAGNOSIS — M533 Sacrococcygeal disorders, not elsewhere classified: Secondary | ICD-10-CM | POA: Diagnosis not present

## 2022-04-02 DIAGNOSIS — M545 Low back pain, unspecified: Secondary | ICD-10-CM | POA: Diagnosis not present

## 2022-04-02 DIAGNOSIS — G8929 Other chronic pain: Secondary | ICD-10-CM | POA: Diagnosis not present

## 2022-04-02 DIAGNOSIS — M7918 Myalgia, other site: Secondary | ICD-10-CM | POA: Diagnosis not present

## 2022-04-09 DIAGNOSIS — M545 Low back pain, unspecified: Secondary | ICD-10-CM | POA: Diagnosis not present

## 2022-04-09 DIAGNOSIS — M546 Pain in thoracic spine: Secondary | ICD-10-CM | POA: Diagnosis not present

## 2022-04-09 DIAGNOSIS — Z981 Arthrodesis status: Secondary | ICD-10-CM | POA: Diagnosis not present

## 2022-04-12 DIAGNOSIS — Z981 Arthrodesis status: Secondary | ICD-10-CM | POA: Diagnosis not present

## 2022-04-12 DIAGNOSIS — Z4789 Encounter for other orthopedic aftercare: Secondary | ICD-10-CM | POA: Diagnosis not present

## 2022-04-12 DIAGNOSIS — M47814 Spondylosis without myelopathy or radiculopathy, thoracic region: Secondary | ICD-10-CM | POA: Diagnosis not present

## 2022-04-13 ENCOUNTER — Ambulatory Visit: Payer: PPO | Admitting: Podiatry

## 2022-04-20 ENCOUNTER — Ambulatory Visit: Payer: PPO | Admitting: Podiatry

## 2022-04-23 DIAGNOSIS — Z981 Arthrodesis status: Secondary | ICD-10-CM | POA: Diagnosis not present

## 2022-04-23 DIAGNOSIS — M545 Low back pain, unspecified: Secondary | ICD-10-CM | POA: Diagnosis not present

## 2022-04-23 DIAGNOSIS — M79672 Pain in left foot: Secondary | ICD-10-CM | POA: Diagnosis not present

## 2022-04-23 DIAGNOSIS — M76822 Posterior tibial tendinitis, left leg: Secondary | ICD-10-CM | POA: Diagnosis not present

## 2022-04-23 DIAGNOSIS — M546 Pain in thoracic spine: Secondary | ICD-10-CM | POA: Diagnosis not present

## 2022-05-10 DIAGNOSIS — M76822 Posterior tibial tendinitis, left leg: Secondary | ICD-10-CM | POA: Diagnosis not present

## 2022-05-10 DIAGNOSIS — Z981 Arthrodesis status: Secondary | ICD-10-CM | POA: Diagnosis not present

## 2022-05-10 DIAGNOSIS — M545 Low back pain, unspecified: Secondary | ICD-10-CM | POA: Diagnosis not present

## 2022-05-10 DIAGNOSIS — M546 Pain in thoracic spine: Secondary | ICD-10-CM | POA: Diagnosis not present

## 2022-05-19 ENCOUNTER — Ambulatory Visit
Admission: RE | Admit: 2022-05-19 | Discharge: 2022-05-19 | Disposition: A | Discharge status: Home / Self Care | Payer: PPO | Source: Ambulatory Visit | Attending: Family Medicine | Admitting: Family Medicine

## 2022-05-19 DIAGNOSIS — Z1231 Encounter for screening mammogram for malignant neoplasm of breast: Secondary | ICD-10-CM

## 2022-05-28 DIAGNOSIS — M545 Low back pain, unspecified: Secondary | ICD-10-CM | POA: Diagnosis not present

## 2022-05-28 DIAGNOSIS — M546 Pain in thoracic spine: Secondary | ICD-10-CM | POA: Diagnosis not present

## 2022-05-28 DIAGNOSIS — Z981 Arthrodesis status: Secondary | ICD-10-CM | POA: Diagnosis not present

## 2022-05-28 DIAGNOSIS — M76822 Posterior tibial tendinitis, left leg: Secondary | ICD-10-CM | POA: Diagnosis not present

## 2022-06-02 DIAGNOSIS — M79672 Pain in left foot: Secondary | ICD-10-CM | POA: Diagnosis not present

## 2022-06-18 DIAGNOSIS — M545 Low back pain, unspecified: Secondary | ICD-10-CM | POA: Diagnosis not present

## 2022-06-18 DIAGNOSIS — Z981 Arthrodesis status: Secondary | ICD-10-CM | POA: Diagnosis not present

## 2022-06-18 DIAGNOSIS — M76822 Posterior tibial tendinitis, left leg: Secondary | ICD-10-CM | POA: Diagnosis not present

## 2022-06-18 DIAGNOSIS — M546 Pain in thoracic spine: Secondary | ICD-10-CM | POA: Diagnosis not present

## 2022-06-25 DIAGNOSIS — M79672 Pain in left foot: Secondary | ICD-10-CM | POA: Diagnosis not present

## 2022-06-29 ENCOUNTER — Telehealth: Payer: Self-pay

## 2022-06-29 DIAGNOSIS — I480 Paroxysmal atrial fibrillation: Secondary | ICD-10-CM

## 2022-06-29 MED ORDER — ELIQUIS 5 MG PO TABS: 5.0000 mg | ORAL_TABLET | Freq: Two times a day (BID) | ORAL | 1 refills | Status: DC

## 2022-06-29 NOTE — Telephone Encounter (Signed)
Prescription refill request for Eliquis received. Indication:  PAF Last office visit: 08/25/21  Caryl Pina MD Scr: 0.91 on 07/09/21 Age: 77 Weight: 97.1kg  Based on above findings Eliquis  twice daily is the appropriate dose.  Pt has appt scheduled 09/02/22 with Dr. Cristal Deer.  Requested CBC/BMP be doe at that time.  Refill approved.

## 2022-06-29 NOTE — Telephone Encounter (Signed)
Refill request for Eliquis 5 mg tab for 90 day

## 2022-07-02 DIAGNOSIS — M533 Sacrococcygeal disorders, not elsewhere classified: Secondary | ICD-10-CM | POA: Diagnosis not present

## 2022-07-02 DIAGNOSIS — G8929 Other chronic pain: Secondary | ICD-10-CM | POA: Diagnosis not present

## 2022-07-02 DIAGNOSIS — Z981 Arthrodesis status: Secondary | ICD-10-CM | POA: Diagnosis not present

## 2022-07-02 DIAGNOSIS — M545 Low back pain, unspecified: Secondary | ICD-10-CM | POA: Diagnosis not present

## 2022-07-02 DIAGNOSIS — M76822 Posterior tibial tendinitis, left leg: Secondary | ICD-10-CM | POA: Diagnosis not present

## 2022-07-02 DIAGNOSIS — M7062 Trochanteric bursitis, left hip: Secondary | ICD-10-CM | POA: Diagnosis not present

## 2022-07-02 DIAGNOSIS — M7918 Myalgia, other site: Secondary | ICD-10-CM | POA: Diagnosis not present

## 2022-07-02 DIAGNOSIS — M546 Pain in thoracic spine: Secondary | ICD-10-CM | POA: Diagnosis not present

## 2022-07-23 DIAGNOSIS — G5762 Lesion of plantar nerve, left lower limb: Secondary | ICD-10-CM | POA: Diagnosis not present

## 2022-07-23 DIAGNOSIS — M79672 Pain in left foot: Secondary | ICD-10-CM | POA: Diagnosis not present

## 2022-08-06 DIAGNOSIS — M76822 Posterior tibial tendinitis, left leg: Secondary | ICD-10-CM | POA: Diagnosis not present

## 2022-08-06 DIAGNOSIS — Z981 Arthrodesis status: Secondary | ICD-10-CM | POA: Diagnosis not present

## 2022-08-06 DIAGNOSIS — M533 Sacrococcygeal disorders, not elsewhere classified: Secondary | ICD-10-CM | POA: Diagnosis not present

## 2022-08-06 DIAGNOSIS — M546 Pain in thoracic spine: Secondary | ICD-10-CM | POA: Diagnosis not present

## 2022-08-06 DIAGNOSIS — M545 Low back pain, unspecified: Secondary | ICD-10-CM | POA: Diagnosis not present

## 2022-08-13 DIAGNOSIS — I700 Atherosclerosis of aorta: Secondary | ICD-10-CM | POA: Diagnosis not present

## 2022-08-13 DIAGNOSIS — E2839 Other primary ovarian failure: Secondary | ICD-10-CM | POA: Diagnosis not present

## 2022-08-13 DIAGNOSIS — Z0000 Encounter for general adult medical examination without abnormal findings: Secondary | ICD-10-CM | POA: Diagnosis not present

## 2022-08-13 DIAGNOSIS — I4891 Unspecified atrial fibrillation: Secondary | ICD-10-CM | POA: Diagnosis not present

## 2022-08-13 DIAGNOSIS — I10 Essential (primary) hypertension: Secondary | ICD-10-CM | POA: Diagnosis not present

## 2022-08-13 DIAGNOSIS — D6869 Other thrombophilia: Secondary | ICD-10-CM | POA: Diagnosis not present

## 2022-08-13 DIAGNOSIS — Z6833 Body mass index (BMI) 33.0-33.9, adult: Secondary | ICD-10-CM | POA: Diagnosis not present

## 2022-08-18 ENCOUNTER — Other Ambulatory Visit: Payer: Self-pay | Admitting: Family Medicine

## 2022-08-18 DIAGNOSIS — E2839 Other primary ovarian failure: Secondary | ICD-10-CM

## 2022-08-19 ENCOUNTER — Ambulatory Visit
Admission: RE | Admit: 2022-08-19 | Discharge: 2022-08-19 | Disposition: A | Discharge status: Home / Self Care | Payer: PPO | Source: Ambulatory Visit | Attending: Family Medicine | Admitting: Family Medicine

## 2022-08-19 DIAGNOSIS — E349 Endocrine disorder, unspecified: Secondary | ICD-10-CM | POA: Diagnosis not present

## 2022-08-19 DIAGNOSIS — M810 Age-related osteoporosis without current pathological fracture: Secondary | ICD-10-CM | POA: Diagnosis not present

## 2022-08-19 DIAGNOSIS — Z90710 Acquired absence of both cervix and uterus: Secondary | ICD-10-CM | POA: Diagnosis not present

## 2022-08-19 DIAGNOSIS — R29890 Loss of height: Secondary | ICD-10-CM | POA: Diagnosis not present

## 2022-08-19 DIAGNOSIS — E2839 Other primary ovarian failure: Secondary | ICD-10-CM

## 2022-08-19 DIAGNOSIS — Z90722 Acquired absence of ovaries, bilateral: Secondary | ICD-10-CM | POA: Diagnosis not present

## 2022-09-02 ENCOUNTER — Ambulatory Visit (HOSPITAL_BASED_OUTPATIENT_CLINIC_OR_DEPARTMENT_OTHER): Payer: PPO | Admitting: Cardiology

## 2022-09-02 ENCOUNTER — Encounter (HOSPITAL_BASED_OUTPATIENT_CLINIC_OR_DEPARTMENT_OTHER): Payer: Self-pay | Admitting: Cardiology

## 2022-09-02 VITALS — BP 132/70 | HR 48 | Ht 69.0 in | Wt 216.4 lb

## 2022-09-02 DIAGNOSIS — D6869 Other thrombophilia: Secondary | ICD-10-CM

## 2022-09-02 DIAGNOSIS — E7800 Pure hypercholesterolemia, unspecified: Secondary | ICD-10-CM | POA: Diagnosis not present

## 2022-09-02 DIAGNOSIS — I10 Essential (primary) hypertension: Secondary | ICD-10-CM

## 2022-09-02 DIAGNOSIS — I2510 Atherosclerotic heart disease of native coronary artery without angina pectoris: Secondary | ICD-10-CM

## 2022-09-02 DIAGNOSIS — I480 Paroxysmal atrial fibrillation: Secondary | ICD-10-CM | POA: Diagnosis not present

## 2022-09-02 DIAGNOSIS — Z7901 Long term (current) use of anticoagulants: Secondary | ICD-10-CM

## 2022-09-02 DIAGNOSIS — R079 Chest pain, unspecified: Secondary | ICD-10-CM | POA: Diagnosis not present

## 2022-09-02 NOTE — Patient Instructions (Signed)
Medication Instructions:  Continue current medications  *If you need a refill on your cardiac medications before your next appointment, please call your pharmacy*   Lab Work: CBC, BMP and Fasting Lipids  If you have labs (blood work) drawn today and your tests are completely normal, you will receive your results only by: MyChart Message (if you have MyChart) OR A paper copy in the mail If you have any lab test that is abnormal or we need to change your treatment, we will call you to review the results.   Testing/Procedures: None Ordered   Follow-Up: At [facility], you and your health needs are our priority.  As part of our continuing mission to provide you with exceptional heart care, we have created designated Provider Care Teams.  These Care Teams include your primary Cardiologist (physician) and Advanced Practice Providers (APPs -  Physician Assistants and Nurse Practitioners) who all work together to provide you with the care you need, when you need it.  We recommend signing up for the patient portal called "MyChart".  Sign up information is provided on this After Visit Summary.  MyChart is used to connect with patients for Virtual Visits (Telemedicine).  Patients are able to view lab/test results, encounter notes, upcoming appointments, etc.  Non-urgent messages can be sent to your provider as well.   To learn more about what you can do with MyChart, go to [email or web address].    Your next appointment:   1 year(s)  Provider:   Jodelle Red, MD    Other Instructions

## 2022-09-02 NOTE — Progress Notes (Signed)
Cardiology Office Note:  .   Date:  09/02/2022  ID:  Katherine Mcpherson, DOB Sep 16, 1945, MRN [id number] PCP: Moshe Cipro, NP  [facility] Providers Cardiologist:  Jodelle Red, MD {  History of Present Illness: .   Katherine Mcpherson is a 77 y.o. female with pmh paroxysmal atrial fibrillation, hypertension, nonobstructive CAD, hyperlipidemia who is seen for follow up today.   Today: She is a new patient to me today, previously seen by Dr. Dulce Sellar. Biggest concern is lack of energy; has been going on since her back surgery. Has been very busy the last two weeks; moved her sister down. Goes to the gym 3x week, rides recumbent bike, does some weight machines. Walking is difficult due to balance, still going to PT. Hasn't had palpitations recently. No issues with medications.   Discussed cholesterol extensively today. She has had family members who have lived into their 24s with a cholesterol over 300 and ho heart issues.  Checks BP intermittently at home. Rare highs, usually situational. Average 130-135 systolic.  ROS: Denies chest pain, shortness of breath at rest or with normal exertion. No PND, orthopnea, LE edema or unexpected weight gain. No syncope or palpitations. ROS otherwise negative except as noted.   Studies Reviewed: Marland Kitchen    EKG:  EKG Interpretation  Date/Time:  Thursday September 02 2022 08:09:52 EDT Ventricular Rate:  48 PR Interval:  218 QRS Duration: 82 QT Interval:  416 QTC Calculation: 371 R Axis:   12 Text Interpretation: Sinus bradycardia with 1st degree A-V block with Premature atrial complexes and Premature ventricular complexes or Fusion complexes Low voltage QRS When compared with ECG of 24-Jul-2012 10:03, Fusion complexes are now Present Confirmed by Jodelle Red [id number]) on 09/02/2022 8:16:40 AM    Physical Exam:   VS:  BP 132/70   Pulse (!) 48   Ht 5\' 9"  (1.753 m)   Wt 216 lb 6.4 oz (98.2 kg)   BMI 31.96 kg/m    Wt Readings from  Last 3 Encounters:  09/02/22 216 lb 6.4 oz (98.2 kg)  08/25/21 214 lb 1.9 oz (97.1 kg)  07/02/21 209 lb (94.8 kg)    GEN: Well nourished, well developed in no acute distress HEENT: Normal, moist mucous membranes NECK: No JVD CARDIAC: regular rhythm, normal S1 and S2, no rubs or gallops. No murmur. VASCULAR: Radial and DP pulses 2+ bilaterally. No carotid bruits RESPIRATORY:  Clear to auscultation without rales, wheezing or rhonchi  ABDOMEN: Soft, non-tender, non-distended MUSCULOSKELETAL:  Ambulates independently SKIN: Warm and dry, no edema NEUROLOGIC:  Alert and oriented x 3. No focal neuro deficits noted. PSYCHIATRIC:  Normal affect    ASSESSMENT AND PLAN: .    Paroxysmal atrial fibrillation -CHA2DS2/VAS Stroke Risk Points=5  -continue apixaban -labs today   -on metoprolol 12.5 mg BID  Hypertension -on lisinopril 5 mg -home numbers better controlled. Discussed goal, she will watch at home.  Nonobstructive CAD Hypercholesterolemia -2023: CaS 1.37, LAD stenosis <25%. -last LDL 2023 129 -would like closer to 70-100; minimal disease but still some plaque, over age 89 -on ezetimibe alone, 1/2 pill.  -check lipids today -there are prior comments about LDL >190 and concerns for statin myalgia. Also comments re: concern for familial hyperlipidemia. Appears she may have remotely taken atorvastatin in 2017.  -muscle cramps when they occur are at night time. -had extensive discussion re: options today. She prefers to stay on current regimen if lipid results are similar to prior  CV risk counseling and prevention -  recommend heart healthy/Mediterranean diet, with whole grains, fruits, vegetable, fish, lean meats, nuts, and olive oil. Limit salt. -recommend moderate walking, 3-5 times/week for 30-50 minutes each session. Aim for at least 150 minutes.week. Goal should be pace of 3 miles/hours, or walking 1.5 miles in 30 minutes -recommend avoidance of tobacco products. Avoid excess  alcohol.  Dispo: 1 year or sooner as needed  Signed, Jodelle Red, MD   Jodelle Red, MD, PhD, [facility] [place]  [facility]  [place]  [facility] & Vascular at [facility] at [facility] [address] [place], [place] [address] [phone]

## 2022-09-03 LAB — LIPID PANEL
Chol/HDL Ratio: 3.2 ratio (ref 0.0–4.4)
Cholesterol, Total: 227 mg/dL — ABNORMAL HIGH (ref 100–199)
HDL: 70 mg/dL (ref >39)
LDL Chol Calc (NIH): 139 mg/dL — ABNORMAL HIGH (ref 0–99)
Triglycerides: 105 mg/dL (ref 0–149)
VLDL Cholesterol Cal: 18 mg/dL (ref 5–40)

## 2022-09-03 LAB — BASIC METABOLIC PANEL
BUN/Creatinine Ratio: 23 (ref 12–28)
BUN: 31 mg/dL — ABNORMAL HIGH (ref 8–27)
CO2: 23 mmol/L (ref 20–29)
Calcium: 9.4 mg/dL (ref 8.7–10.3)
Chloride: 108 mmol/L — ABNORMAL HIGH (ref 96–106)
Creatinine, Ser: 1.33 mg/dL — ABNORMAL HIGH (ref 0.57–1.00)
Glucose: 79 mg/dL (ref 70–99)
Potassium: 5.6 mmol/L — ABNORMAL HIGH (ref 3.5–5.2)
Sodium: 143 mmol/L (ref 134–144)
eGFR: 41 mL/min/{1.73_m2} — ABNORMAL LOW (ref >59)

## 2022-09-03 LAB — CBC
Hematocrit: 40.9 % (ref 34.0–46.6)
Hemoglobin: 12.9 g/dL (ref 11.1–15.9)
MCH: 29.5 pg (ref 26.6–33.0)
MCHC: 31.5 g/dL (ref 31.5–35.7)
MCV: 93 fL (ref 79–97)
Platelets: 272 10*3/uL (ref 150–450)
RBC: 4.38 x10E6/uL (ref 3.77–5.28)
RDW: 13.4 % (ref 11.7–15.4)
WBC: 7 10*3/uL (ref 3.4–10.8)

## 2022-09-11 ENCOUNTER — Encounter (HOSPITAL_BASED_OUTPATIENT_CLINIC_OR_DEPARTMENT_OTHER): Payer: Self-pay

## 2022-09-20 ENCOUNTER — Encounter (HOSPITAL_BASED_OUTPATIENT_CLINIC_OR_DEPARTMENT_OTHER): Payer: Self-pay

## 2022-09-20 ENCOUNTER — Telehealth (HOSPITAL_BASED_OUTPATIENT_CLINIC_OR_DEPARTMENT_OTHER): Payer: Self-pay

## 2022-09-20 DIAGNOSIS — I10 Essential (primary) hypertension: Secondary | ICD-10-CM | POA: Diagnosis not present

## 2022-09-20 NOTE — Telephone Encounter (Addendum)
Seen by patient Katherine Mcpherson on 09/20/2022 10:12 AM; follow up mychart message sent to patient. Repeat labs ordered.    ----- Message from Alver Sorrow, NP sent at 09/20/2022 10:10 AM EDT ----- CBC with no evidence of anemia nor infection.  Cholesterol elevated and has increased from prior. Prior difficulties with cholesterol medications - recommend referral to pharmacy lipid clinic. Kidney function overall stable. Potassium elevated. Recommend repeat BMP Mon-Wed of this week to reassess potassium level.

## 2022-09-21 ENCOUNTER — Encounter (HOSPITAL_BASED_OUTPATIENT_CLINIC_OR_DEPARTMENT_OTHER): Payer: Self-pay

## 2022-09-21 ENCOUNTER — Telehealth (HOSPITAL_BASED_OUTPATIENT_CLINIC_OR_DEPARTMENT_OTHER): Payer: Self-pay

## 2022-09-21 DIAGNOSIS — I10 Essential (primary) hypertension: Secondary | ICD-10-CM

## 2022-09-21 LAB — BASIC METABOLIC PANEL
BUN/Creatinine Ratio: 26 (ref 12–28)
BUN: 32 mg/dL — ABNORMAL HIGH (ref 8–27)
CO2: 21 mmol/L (ref 20–29)
Calcium: 9.2 mg/dL (ref 8.7–10.3)
Chloride: 102 mmol/L (ref 96–106)
Creatinine, Ser: 1.21 mg/dL — ABNORMAL HIGH (ref 0.57–1.00)
Glucose: 119 mg/dL — ABNORMAL HIGH (ref 70–99)
Potassium: 5.4 mmol/L — ABNORMAL HIGH (ref 3.5–5.2)
Sodium: 138 mmol/L (ref 134–144)
eGFR: 46 mL/min/{1.73_m2} — ABNORMAL LOW (ref >59)

## 2022-09-21 NOTE — Telephone Encounter (Addendum)
Seen by patient Katherine Mcpherson on 09/21/2022  1:24 PM; follow up mychart message sent to patient.   ----- Message from Alver Sorrow, NP sent at 09/21/2022  1:14 PM EDT ----- Kidney function improved from previous.  Potassium remains mildly elevated. Recommend reducing intake of potassium rich foods (potato, cantaloupe, avocado, banana, tomato), avoiding salt substitute and electrolyte drinks. Hold Lisinopril for 2 days then return to once daily dosing. Repeat BMP in 5-7 days for monitoring.

## 2022-09-22 DIAGNOSIS — Z961 Presence of intraocular lens: Secondary | ICD-10-CM | POA: Diagnosis not present

## 2022-09-22 DIAGNOSIS — M62830 Muscle spasm of back: Secondary | ICD-10-CM | POA: Diagnosis not present

## 2022-09-27 DIAGNOSIS — I10 Essential (primary) hypertension: Secondary | ICD-10-CM | POA: Diagnosis not present

## 2022-09-28 LAB — BASIC METABOLIC PANEL
BUN/Creatinine Ratio: 24 (ref 12–28)
BUN: 30 mg/dL — ABNORMAL HIGH (ref 8–27)
CO2: 20 mmol/L (ref 20–29)
Calcium: 9.2 mg/dL (ref 8.7–10.3)
Chloride: 103 mmol/L (ref 96–106)
Creatinine, Ser: 1.26 mg/dL — ABNORMAL HIGH (ref 0.57–1.00)
Glucose: 79 mg/dL (ref 70–99)
Potassium: 5.2 mmol/L (ref 3.5–5.2)
Sodium: 140 mmol/L (ref 134–144)
eGFR: 44 mL/min/{1.73_m2} — ABNORMAL LOW (ref >59)

## 2022-10-09 ENCOUNTER — Encounter (HOSPITAL_BASED_OUTPATIENT_CLINIC_OR_DEPARTMENT_OTHER): Payer: Self-pay

## 2022-10-10 ENCOUNTER — Other Ambulatory Visit: Payer: Self-pay | Admitting: Cardiology

## 2022-10-11 ENCOUNTER — Encounter (HOSPITAL_BASED_OUTPATIENT_CLINIC_OR_DEPARTMENT_OTHER): Payer: Self-pay

## 2022-10-11 DIAGNOSIS — N183 Chronic kidney disease, stage 3 unspecified: Secondary | ICD-10-CM

## 2022-10-11 DIAGNOSIS — I119 Hypertensive heart disease without heart failure: Secondary | ICD-10-CM

## 2022-10-11 DIAGNOSIS — I480 Paroxysmal atrial fibrillation: Secondary | ICD-10-CM

## 2022-10-11 DIAGNOSIS — I10 Essential (primary) hypertension: Secondary | ICD-10-CM

## 2022-10-11 DIAGNOSIS — Z7901 Long term (current) use of anticoagulants: Secondary | ICD-10-CM

## 2022-10-11 MED ORDER — METOPROLOL TARTRATE 25 MG PO TABS: 12.5000 mg | ORAL_TABLET | Freq: Two times a day (BID) | ORAL | 0 refills | Status: DC

## 2022-10-11 MED ORDER — EZETIMIBE 10 MG PO TABS: 5.0000 mg | ORAL_TABLET | Freq: Every day | ORAL | 3 refills | Status: DC

## 2022-10-11 MED ORDER — METOPROLOL TARTRATE 25 MG PO TABS: 12.5000 mg | ORAL_TABLET | Freq: Two times a day (BID) | ORAL | 3 refills | Status: DC

## 2022-10-11 MED ORDER — EZETIMIBE 10 MG PO TABS: 5.0000 mg | ORAL_TABLET | Freq: Every day | ORAL | 0 refills | Status: DC

## 2022-10-11 MED ORDER — LISINOPRIL 5 MG PO TABS
5.0000 mg | ORAL_TABLET | Freq: Every day | ORAL | 3 refills | Status: DC
Start: 2022-10-11 — End: 2023-11-03

## 2022-10-14 ENCOUNTER — Encounter (HOSPITAL_BASED_OUTPATIENT_CLINIC_OR_DEPARTMENT_OTHER): Payer: Self-pay

## 2022-10-20 ENCOUNTER — Other Ambulatory Visit (HOSPITAL_BASED_OUTPATIENT_CLINIC_OR_DEPARTMENT_OTHER): Payer: Self-pay | Admitting: Cardiology

## 2022-10-20 ENCOUNTER — Encounter (HOSPITAL_BASED_OUTPATIENT_CLINIC_OR_DEPARTMENT_OTHER): Payer: Self-pay

## 2022-10-20 MED ORDER — METOPROLOL TARTRATE 25 MG PO TABS: 12.5000 mg | ORAL_TABLET | Freq: Two times a day (BID) | ORAL | 0 refills | Status: DC

## 2022-11-05 DIAGNOSIS — M7918 Myalgia, other site: Secondary | ICD-10-CM | POA: Diagnosis not present

## 2022-11-05 DIAGNOSIS — G8929 Other chronic pain: Secondary | ICD-10-CM | POA: Diagnosis not present

## 2022-11-05 DIAGNOSIS — M533 Sacrococcygeal disorders, not elsewhere classified: Secondary | ICD-10-CM | POA: Diagnosis not present

## 2022-11-05 DIAGNOSIS — M7061 Trochanteric bursitis, right hip: Secondary | ICD-10-CM | POA: Diagnosis not present

## 2022-11-05 DIAGNOSIS — Z981 Arthrodesis status: Secondary | ICD-10-CM | POA: Diagnosis not present

## 2022-11-11 ENCOUNTER — Other Ambulatory Visit (HOSPITAL_BASED_OUTPATIENT_CLINIC_OR_DEPARTMENT_OTHER): Payer: Self-pay | Admitting: Cardiology

## 2022-11-11 NOTE — Telephone Encounter (Signed)
Rx request sent to pharmacy.  

## 2022-11-29 ENCOUNTER — Encounter (HOSPITAL_BASED_OUTPATIENT_CLINIC_OR_DEPARTMENT_OTHER): Payer: Self-pay

## 2022-11-29 DIAGNOSIS — I480 Paroxysmal atrial fibrillation: Secondary | ICD-10-CM

## 2022-11-29 MED ORDER — ELIQUIS 5 MG PO TABS
5.0000 mg | ORAL_TABLET | Freq: Two times a day (BID) | ORAL | 3 refills | Status: DC
Start: 2022-11-29 — End: 2023-12-08

## 2022-11-29 MED ORDER — ELIQUIS 5 MG PO TABS
5.0000 mg | ORAL_TABLET | Freq: Two times a day (BID) | ORAL | 3 refills | Status: DC
Start: 2022-11-29 — End: 2022-11-29

## 2022-12-13 DIAGNOSIS — M461 Sacroiliitis, not elsewhere classified: Secondary | ICD-10-CM | POA: Diagnosis not present

## 2022-12-13 DIAGNOSIS — M4325 Fusion of spine, thoracolumbar region: Secondary | ICD-10-CM | POA: Diagnosis not present

## 2022-12-31 DIAGNOSIS — M533 Sacrococcygeal disorders, not elsewhere classified: Secondary | ICD-10-CM | POA: Diagnosis not present

## 2022-12-31 DIAGNOSIS — M7918 Myalgia, other site: Secondary | ICD-10-CM | POA: Diagnosis not present

## 2022-12-31 DIAGNOSIS — G8929 Other chronic pain: Secondary | ICD-10-CM | POA: Diagnosis not present

## 2023-01-13 IMAGING — MR MR MRA HEAD W/O CM
1 series · 19 of 48 positions shown · non-contrast
Comparison: No pertinent prior exam.

CLINICAL DATA: Primary hypertension.

EXAM:
MRA HEAD WITHOUT CONTRAST
TECHNIQUE: Angiographic images of the Circle of Willis were acquired using MRA
technique without intravenous contrast.

[Series 3: tof_3d_multi-slab · axial · 0.5mm · 0.39mm/px · z∈[-13,+71]mm · 19 of 188 slices shown]
[im 1/188]
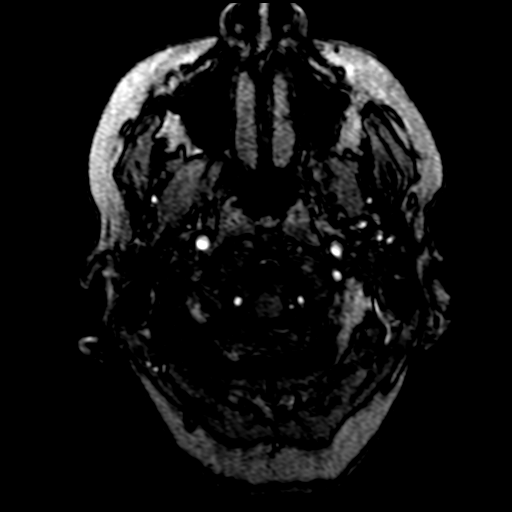
[im 4/188]
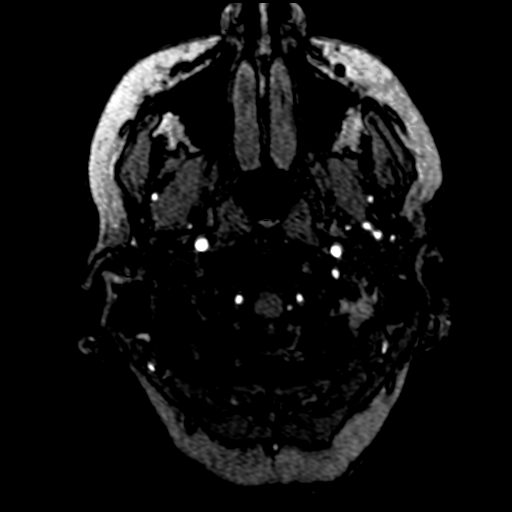
[im 8/188]
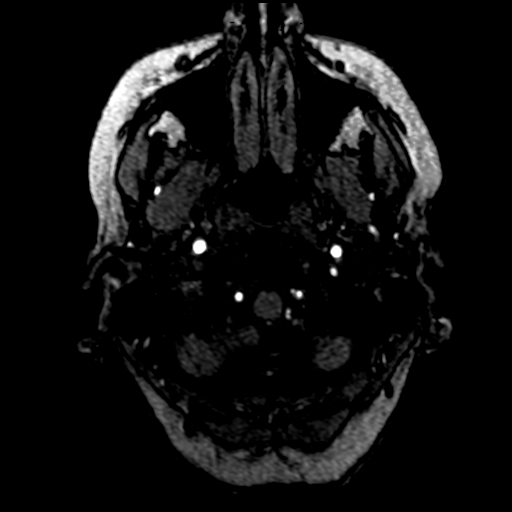
[im 12/188]
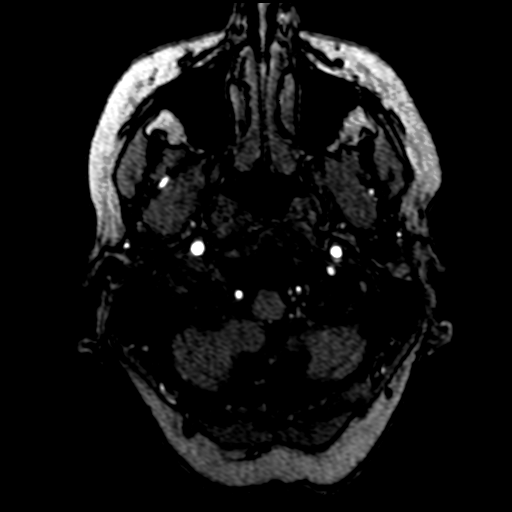
[im 16/188]
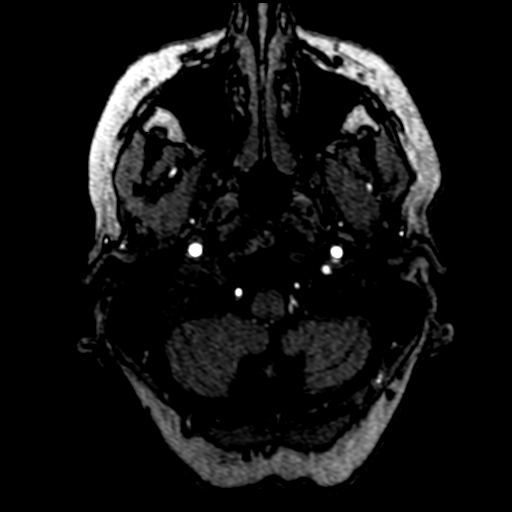
[im 20/188]
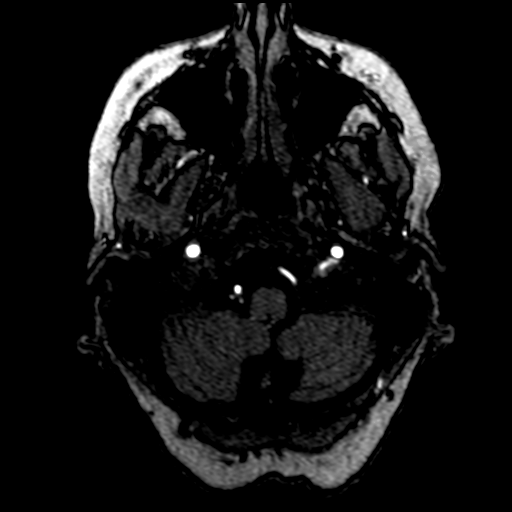
[im 24/188]
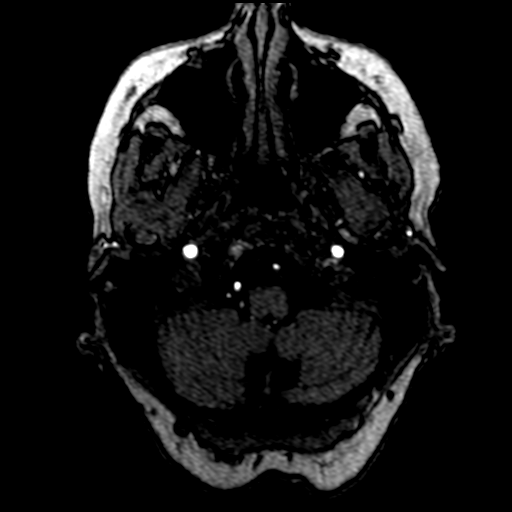
[im 28/188]
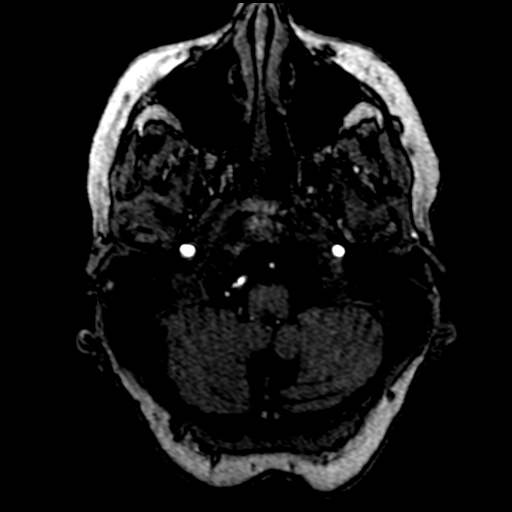
[im 32/188]
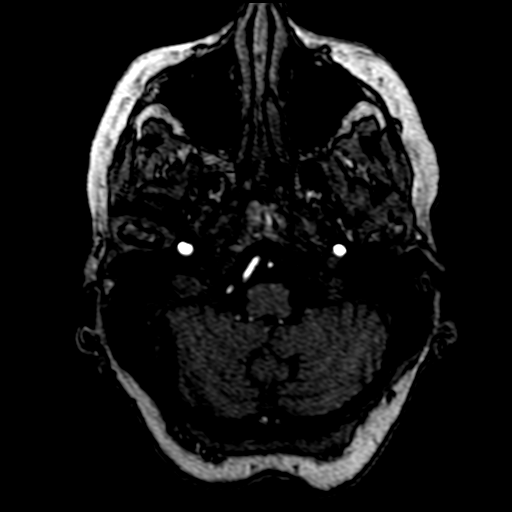
[im 36/188]
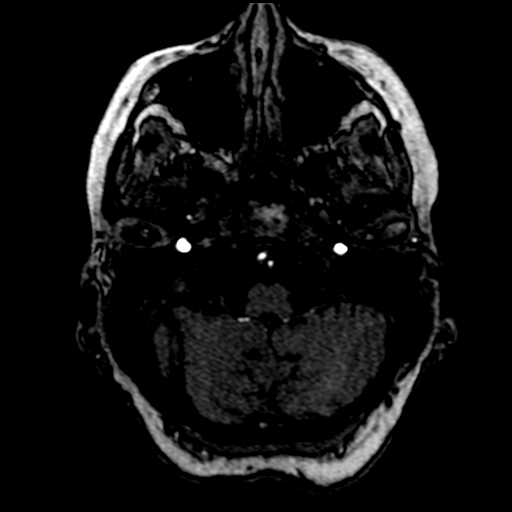
[im 40/188]
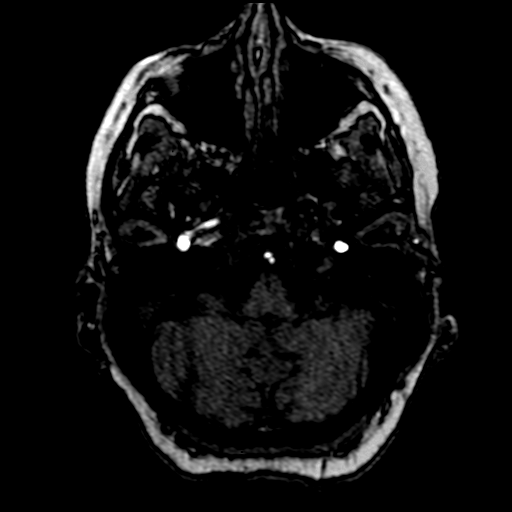
[im 60/188]
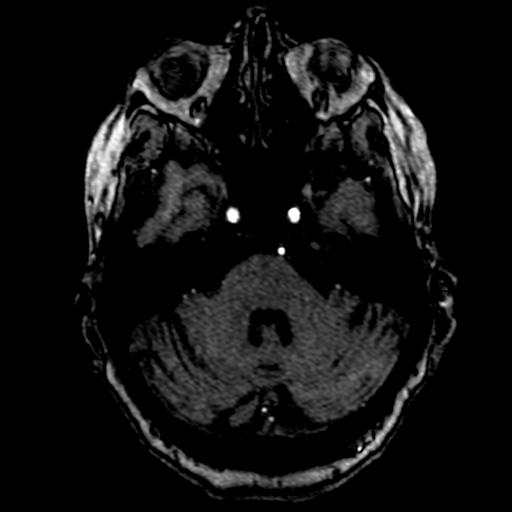
[im 84/188]
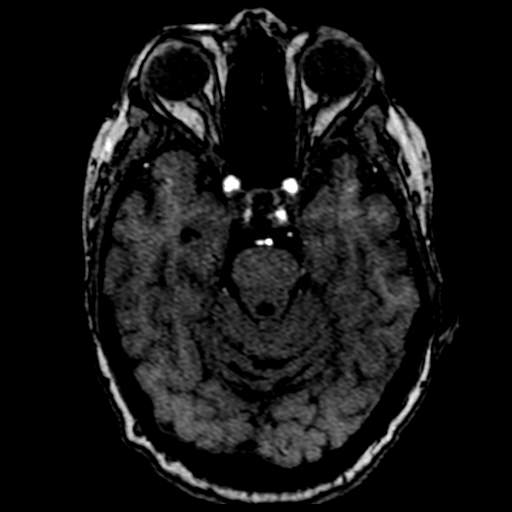
[im 96/188]
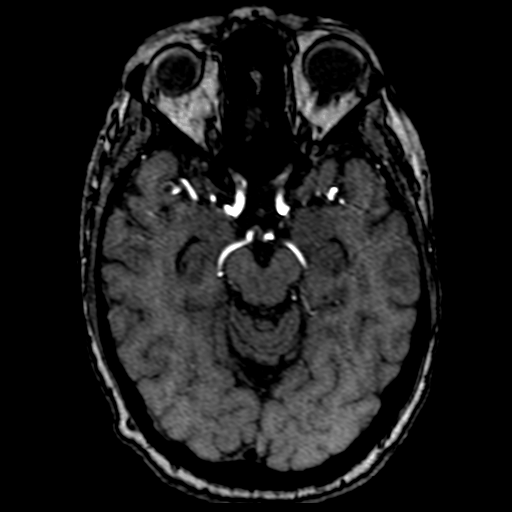
[im 108/188]
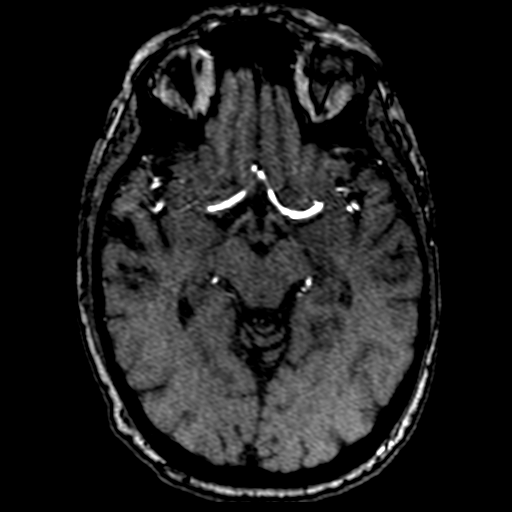
[im 132/188]
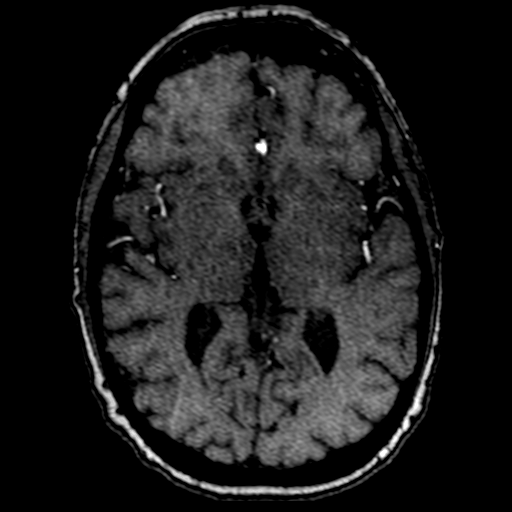
[im 156/188]
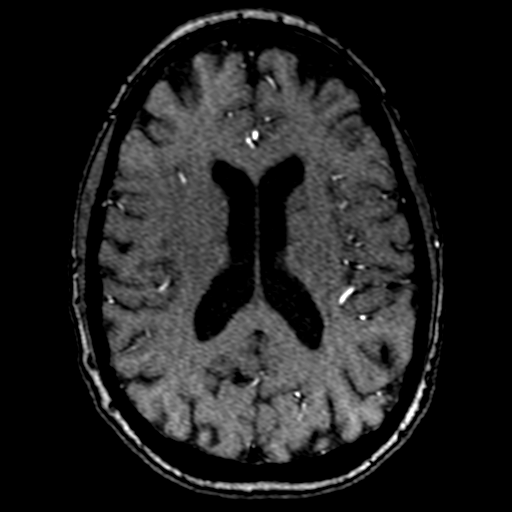
[im 160/188]
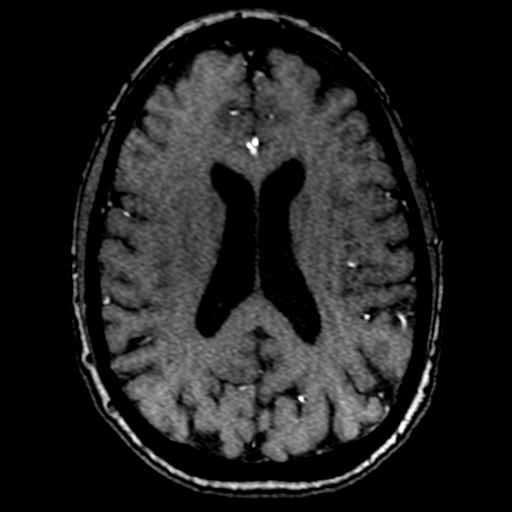
[im 180/188]
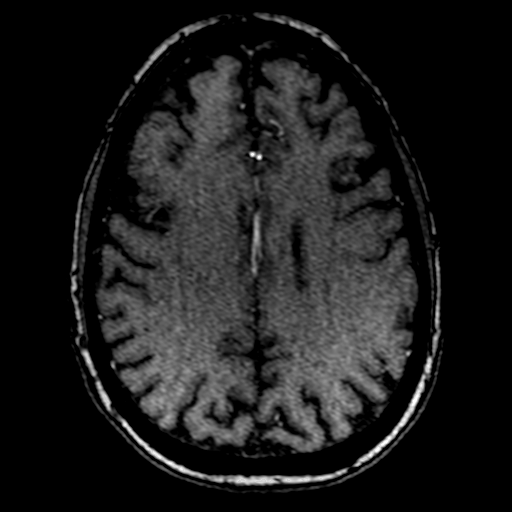

[19 of 48 positions shown; findings below may reference images not displayed]

FINDINGS: Anterior circulation: Internal carotid arteries are within normal
limits from the high cervical segments through the ICA is termini
bilaterally. A1 and M1 segments are normal. Early bifurcation of the
right MCA noted. Bilateral A1 segments in the left M1 segment are
within normal limits. MCA and ACA branch vessels are normal. No
significant stenosis, aneurysm, or branch vessel occlusion.

Posterior circulation: Right vertebral artery is the dominant
vessel. PICA origins are visualized and normal bilaterally.
Vertebrobasilar junction and basilar artery are normal. The left
posterior cerebral artery originates from the basilar tip. A small
posterior communicating artery contributes. The right posterior
communicating artery is of fetal origin. PCA branch vessels are
within normal limits bilaterally.

Anatomic variants: Fetal type right posterior cerebral artery.

Other: None.
IMPRESSION: Normal MRA circle-of-Willis. No significant proximal stenosis,
aneurysm, or branch vessel occlusion.

## 2023-01-18 DIAGNOSIS — M533 Sacrococcygeal disorders, not elsewhere classified: Secondary | ICD-10-CM | POA: Diagnosis not present

## 2023-01-18 DIAGNOSIS — M545 Low back pain, unspecified: Secondary | ICD-10-CM | POA: Diagnosis not present

## 2023-02-02 DIAGNOSIS — M533 Sacrococcygeal disorders, not elsewhere classified: Secondary | ICD-10-CM | POA: Diagnosis not present

## 2023-02-03 DIAGNOSIS — T8484XA Pain due to internal orthopedic prosthetic devices, implants and grafts, initial encounter: Secondary | ICD-10-CM | POA: Diagnosis not present

## 2023-02-28 DIAGNOSIS — M533 Sacrococcygeal disorders, not elsewhere classified: Secondary | ICD-10-CM | POA: Diagnosis not present

## 2023-06-01 DIAGNOSIS — M79672 Pain in left foot: Secondary | ICD-10-CM | POA: Diagnosis not present

## 2023-06-02 ENCOUNTER — Other Ambulatory Visit: Payer: Self-pay | Admitting: Family Medicine

## 2023-06-02 DIAGNOSIS — Z1231 Encounter for screening mammogram for malignant neoplasm of breast: Secondary | ICD-10-CM

## 2023-06-13 DIAGNOSIS — M533 Sacrococcygeal disorders, not elsewhere classified: Secondary | ICD-10-CM | POA: Diagnosis not present

## 2023-06-15 ENCOUNTER — Ambulatory Visit
Admission: RE | Admit: 2023-06-15 | Discharge: 2023-06-15 | Disposition: A | Discharge status: Home / Self Care | Source: Ambulatory Visit | Attending: Family Medicine | Admitting: Family Medicine

## 2023-06-15 DIAGNOSIS — Z1231 Encounter for screening mammogram for malignant neoplasm of breast: Secondary | ICD-10-CM | POA: Diagnosis not present

## 2023-06-23 DIAGNOSIS — M533 Sacrococcygeal disorders, not elsewhere classified: Secondary | ICD-10-CM | POA: Diagnosis not present

## 2023-07-15 DIAGNOSIS — M533 Sacrococcygeal disorders, not elsewhere classified: Secondary | ICD-10-CM | POA: Diagnosis not present

## 2023-10-07 DIAGNOSIS — Z0000 Encounter for general adult medical examination without abnormal findings: Secondary | ICD-10-CM | POA: Diagnosis not present

## 2023-10-07 DIAGNOSIS — G8929 Other chronic pain: Secondary | ICD-10-CM | POA: Diagnosis not present

## 2023-10-07 DIAGNOSIS — I4891 Unspecified atrial fibrillation: Secondary | ICD-10-CM | POA: Diagnosis not present

## 2023-10-07 DIAGNOSIS — I10 Essential (primary) hypertension: Secondary | ICD-10-CM | POA: Diagnosis not present

## 2023-10-07 DIAGNOSIS — E7800 Pure hypercholesterolemia, unspecified: Secondary | ICD-10-CM | POA: Diagnosis not present

## 2023-10-13 DIAGNOSIS — M7918 Myalgia, other site: Secondary | ICD-10-CM | POA: Diagnosis not present

## 2023-10-13 DIAGNOSIS — G8929 Other chronic pain: Secondary | ICD-10-CM | POA: Diagnosis not present

## 2023-10-13 DIAGNOSIS — M545 Low back pain, unspecified: Secondary | ICD-10-CM | POA: Diagnosis not present

## 2023-10-31 DIAGNOSIS — M5459 Other low back pain: Secondary | ICD-10-CM | POA: Diagnosis not present

## 2023-11-01 ENCOUNTER — Encounter (HOSPITAL_BASED_OUTPATIENT_CLINIC_OR_DEPARTMENT_OTHER): Payer: Self-pay

## 2023-11-03 ENCOUNTER — Ambulatory Visit (HOSPITAL_BASED_OUTPATIENT_CLINIC_OR_DEPARTMENT_OTHER): Admitting: Cardiology

## 2023-11-03 ENCOUNTER — Encounter (HOSPITAL_BASED_OUTPATIENT_CLINIC_OR_DEPARTMENT_OTHER): Payer: Self-pay | Admitting: Cardiology

## 2023-11-03 VITALS — BP 126/78 | HR 50 | Ht 69.0 in

## 2023-11-03 DIAGNOSIS — Z7901 Long term (current) use of anticoagulants: Secondary | ICD-10-CM

## 2023-11-03 DIAGNOSIS — I10 Essential (primary) hypertension: Secondary | ICD-10-CM | POA: Diagnosis not present

## 2023-11-03 DIAGNOSIS — I2510 Atherosclerotic heart disease of native coronary artery without angina pectoris: Secondary | ICD-10-CM | POA: Diagnosis not present

## 2023-11-03 DIAGNOSIS — E7800 Pure hypercholesterolemia, unspecified: Secondary | ICD-10-CM

## 2023-11-03 DIAGNOSIS — I480 Paroxysmal atrial fibrillation: Secondary | ICD-10-CM | POA: Diagnosis not present

## 2023-11-03 DIAGNOSIS — D6869 Other thrombophilia: Secondary | ICD-10-CM

## 2023-11-03 MED ORDER — EZETIMIBE 10 MG PO TABS: 5.0000 mg | ORAL_TABLET | Freq: Every day | ORAL | 3 refills | Status: AC

## 2023-11-03 MED ORDER — LISINOPRIL 5 MG PO TABS: 5.0000 mg | ORAL_TABLET | Freq: Every day | ORAL | 3 refills | Status: AC

## 2023-11-03 NOTE — Progress Notes (Signed)
 Cardiology Office Note:  .   Date:  11/03/2023  ID:  Katherine Mcpherson, DOB 1946/01/21, MRN [id number] PCP: Dayna Motto, DO  [facility] Providers Cardiologist:  Shelda Bruckner, MD {  History of Present Illness: .   Katherine Mcpherson is a 78 y.o. female with pmh paroxysmal atrial fibrillation, hypertension, nonobstructive CAD, hyperlipidemia who is seen for follow up today. She was previously followed by Dr. Monetta and established care with me on 09/02/22.  Prior CV history: Most recent echo 2020 with EF 60-65%, mild LVH, G2DD, normal RV, mild-moderate MR with mild-moderate MAC, mild aortic calcification without significant stenosis/regurgitation. Coronary CT 2023 with Ca score 1.27 (21st %ile), minimal nonobstructive CAD in LAD.  FH: Notes family members who have lived into their 29s with a cholesterol over 300 and ho heart issues.  Today: Having a lot of back pain for the last 6 weeks, severely limiting her. Tearful today, has had a lot of stress and has lost people close to her. Working with a new pain management team, hasn't had relief with trigger point injections, had MRI to see if there is anything they can do. Still trying to ride recumbent bike and elliptical-type machine three times/week as well as some weight training as tolerated. Can't walk far because of her back.  Blood pressure was well controlled recently when she wasn't in pain.  Her apple watch tracks her afib, reported as 2-8% burden, she has not felt it. Bruises easily but no significant bleeding.  ROS: Denies chest pain, shortness of breath at rest or with normal exertion. No PND, orthopnea, LE edema or unexpected weight gain. No syncope or palpitations. ROS otherwise negative except as noted.   Studies Reviewed: SABRA    EKG:  EKG Interpretation Date/Time:  Thursday November 03 2023 10:33:09 EDT Ventricular Rate:  50 PR Interval:  194 QRS Duration:  74 QT Interval:  436 QTC Calculation: 397 R  Axis:   -15  Text Interpretation: Sinus bradycardia Confirmed by Bruckner Shelda [phone]) on 11/03/2023 11:03:42 AM    Physical Exam:   VS:  BP 126/78   Pulse (!) 50   Ht 5' 9 (1.753 m)   SpO2 98%   BMI 31.96 kg/m    Wt Readings from Last 3 Encounters:  09/02/22 216 lb 6.4 oz (98.2 kg)  08/25/21 214 lb 1.9 oz (97.1 kg)  07/02/21 209 lb (94.8 kg)    GEN: Well nourished, well developed in no acute distress HEENT: Normal, moist mucous membranes NECK: No JVD CARDIAC: regular rhythm, normal S1 and S2, no rubs or gallops. No murmur. VASCULAR: Radial and DP pulses 2+ bilaterally. No carotid bruits RESPIRATORY:  Clear to auscultation without rales, wheezing or rhonchi  ABDOMEN: Soft, non-tender, non-distended MUSCULOSKELETAL:  Ambulates independently SKIN: Warm and dry, no edema NEUROLOGIC:  Alert and oriented x 3. No focal neuro deficits noted. PSYCHIATRIC:  Normal affect    ASSESSMENT AND PLAN: .    Paroxysmal atrial fibrillation Secondary hypercoagulable state -CHA2DS2/VAS Stroke Risk Points=5  -continue apixaban  -on metoprolol  12.5 mg BID  Hypertension -on lisinopril  5 mg -at goal on recheck, initial elevated (just walked in/back pain)  Nonobstructive CAD Hypercholesterolemia -2023: CaS 1.37, LAD stenosis <25%. -last LDL 2024 ws 139 -we have discussed goals and options. She would prefer to not change management, wants to avoid statin -on ezetimibe  alone, 1/2 pill.   Mild aortic regurgitation, mild-moderate MR -on echo in 2020, asymptomatic -she will contact me if she develops symptoms  CV  risk counseling and prevention -recommend heart healthy/Mediterranean diet, with whole grains, fruits, vegetable, fish, lean meats, nuts, and olive oil. Limit salt. -recommend moderate walking, 3-5 times/week for 30-50 minutes each session. Aim for at least 150 minutes.week. Goal should be pace of 3 miles/hours, or walking 1.5 miles in 30 minutes -recommend avoidance of  tobacco products. Avoid excess alcohol.  Dispo: 1 year or sooner as needed  Signed, Shelda Bruckner, MD   Shelda Bruckner, MD, PhD, [facility] [place]  [facility]  [place]  [facility] at [facility] at [facility] [address] [place], [place] [address] [phone]

## 2023-11-03 NOTE — Patient Instructions (Signed)
 Medication Instructions:  Your physician recommends that you continue on your current medications as directed. Please refer to the Current Medication list given to you today.  *If you need a refill on your cardiac medications before your next appointment, please call your pharmacy*  Lab Work: NONE  Testing/Procedures: NONE  Follow-Up: At [facility], you and your health needs are our priority.  As part of our continuing mission to provide you with exceptional heart care, we have created designated Provider Care Teams.  These Care Teams include your primary Cardiologist (physician) and Advanced Practice Providers (APPs -  Physician Assistants and Nurse Practitioners) who all work together to provide you with the care you need, when you need it.  We recommend signing up for the patient portal called MyChart.  Sign up information is provided on this After Visit Summary.  MyChart is used to connect with patients for Virtual Visits (Telemedicine).  Patients are able to view lab/test results, encounter notes, upcoming appointments, etc.  Non-urgent messages can be sent to your provider as well.   To learn more about what you can do with MyChart, go to [email or web address].    Your next appointment:   12 month(s)  The format for your next appointment:   In Person  Provider:   DR LONNI RECHE ORN NP, OR ROSALINE RAMAN NP

## 2023-11-16 ENCOUNTER — Telehealth: Payer: Self-pay | Admitting: *Deleted

## 2023-11-16 DIAGNOSIS — M7918 Myalgia, other site: Secondary | ICD-10-CM | POA: Diagnosis not present

## 2023-11-16 DIAGNOSIS — G8929 Other chronic pain: Secondary | ICD-10-CM | POA: Diagnosis not present

## 2023-11-16 DIAGNOSIS — M545 Low back pain, unspecified: Secondary | ICD-10-CM | POA: Diagnosis not present

## 2023-11-16 DIAGNOSIS — M5416 Radiculopathy, lumbar region: Secondary | ICD-10-CM | POA: Diagnosis not present

## 2023-11-16 NOTE — Telephone Encounter (Signed)
   Pre-operative Risk Assessment    Patient Name: Katherine Mcpherson  DOB: 1945/08/14 MRN: [id number]   Date of last office visit: 11/03/23 DR. BRIDGETTE CHRISTOPHER Date of next office visit: NONE   Request for Surgical Clearance    Procedure:  ESI, LUMBAR TRANSFORAMINAL ESI -RIGHT L3-4   Date of Surgery:  Clearance TBD                                Surgeon:  DR. OZELL FINNEY Surgeon's Group or Practice Name:  [facility] Phone number:  [phone] EXT [phone] MELISSA Fax number:  [phone]   Type of Clearance Requested:   - Medical  - Pharmacy:  Hold Apixaban  (Eliquis ) x 3 DAYS PRIOR   Type of Anesthesia:  Not Indicated   Additional requests/questions:    Bonney Niels Jest   11/16/2023, 3:53 PM

## 2023-11-18 NOTE — Telephone Encounter (Signed)
 Patient with diagnosis of atrial fibrillation on Eliquis  for anticoagulation.    Procedure:  ESI, LUMBAR TRANSFORAMINAL ESI -RIGHT L3-4    Date of Surgery:  Clearance TBD      CHA2DS2-VASc Score = 5   This indicates a 7.2% annual risk of stroke. The patient's score is based upon: CHF History: 0 HTN History: 1 Diabetes History: 0 Stroke History: 0 Vascular Disease History: 1 Age Score: 2 Gender Score: 1    CrCl 61 Platelet count 290  Patient has not had an Afib/aflutter ablation or Watchman within the last 3 months or DCCV within the last 30 days   Per office protocol, patient can hold Eliquis  for 3 days prior to procedure.   Patient will not need bridging with Lovenox (enoxaparin) around procedure.  **This guidance is not considered finalized until pre-operative APP has relayed final recommendations.**

## 2023-11-24 ENCOUNTER — Encounter (HOSPITAL_BASED_OUTPATIENT_CLINIC_OR_DEPARTMENT_OTHER): Payer: Self-pay

## 2023-11-29 NOTE — Telephone Encounter (Signed)
   Patient Name: Katherine Mcpherson  DOB: [birth year] MRN: [id number]  Primary Cardiologist: Shelda Bruckner, MD  Chart reviewed as part of pre-operative protocol coverage. Given past medical history and time since last visit, based on ACC/AHA guidelines, Katherine Mcpherson is at acceptable risk for the planned procedure without further cardiovascular testing.   Patient recently seen by Dr. Bruckner on 11/03/2023. Per Dr. Bruckner:   Epidural spinal injections are low risk, do not require formal clearance, find to proceed. Has been given anticoagulation recommendations based on note.   PharmD recommendations:  Per office protocol, patient can hold Eliquis  for 3 days prior to procedure.   Patient will not need bridging with Lovenox (enoxaparin) around procedure.  Please resume Eliquis  as soon as possible postprocedure, at the discretion of the surgeon.   I will route this recommendation to the requesting party via Epic fax function and remove from pre-op pool.  Please call with questions.  Elpidio Thielen E Rox Mcgriff, NP 11/29/2023, 3:32 PM

## 2023-12-07 DIAGNOSIS — M5416 Radiculopathy, lumbar region: Secondary | ICD-10-CM | POA: Diagnosis not present

## 2023-12-08 ENCOUNTER — Other Ambulatory Visit: Payer: Self-pay

## 2023-12-08 ENCOUNTER — Telehealth: Payer: Self-pay | Admitting: Cardiology

## 2023-12-08 DIAGNOSIS — I480 Paroxysmal atrial fibrillation: Secondary | ICD-10-CM

## 2023-12-08 MED ORDER — ELIQUIS 5 MG PO TABS
5.0000 mg | ORAL_TABLET | Freq: Two times a day (BID) | ORAL | 3 refills | Status: AC
Start: 2023-12-08 — End: ?

## 2023-12-08 MED ORDER — METOPROLOL TARTRATE 25 MG PO TABS: 12.5000 mg | ORAL_TABLET | Freq: Two times a day (BID) | ORAL | 3 refills | Status: AC

## 2023-12-08 NOTE — Telephone Encounter (Signed)
 Prescription refill request for Eliquis  received. Indication:afib Last office visit:8/25 Scr:1.15  2025 Age: 78 Weight:98.2  kg  Prescription refilled

## 2023-12-08 NOTE — Telephone Encounter (Signed)
*  STAT* If patient is at the pharmacy, call can be transferred to refill team.   1. Which medications need to be refilled? (please list name of each medication and dose if known) metoprolol  tartrate (LOPRESSOR ) 25 MG tablet    2. Would you like to learn more about the convenience, safety, & potential cost savings by using the [facility]?     3. Are you open to using the [facility] (Type [facility].  ).   4. Which pharmacy/location (including street and city if local pharmacy) is medication to be sent to? [facility] [employer], [place] - [address]    5. Do they need a 30 day or 90 day supply? 90 day

## 2023-12-08 NOTE — Telephone Encounter (Signed)
 RX sent in

## 2023-12-23 DIAGNOSIS — M25562 Pain in left knee: Secondary | ICD-10-CM | POA: Diagnosis not present

## 2023-12-23 DIAGNOSIS — M25561 Pain in right knee: Secondary | ICD-10-CM | POA: Diagnosis not present

## 2024-01-02 ENCOUNTER — Encounter (HOSPITAL_BASED_OUTPATIENT_CLINIC_OR_DEPARTMENT_OTHER): Payer: Self-pay

## 2024-01-02 DIAGNOSIS — M5416 Radiculopathy, lumbar region: Secondary | ICD-10-CM | POA: Diagnosis not present

## 2024-01-02 DIAGNOSIS — G8929 Other chronic pain: Secondary | ICD-10-CM | POA: Diagnosis not present

## 2024-01-02 DIAGNOSIS — M7918 Myalgia, other site: Secondary | ICD-10-CM | POA: Diagnosis not present

## 2024-01-02 DIAGNOSIS — M545 Low back pain, unspecified: Secondary | ICD-10-CM | POA: Diagnosis not present

## 2024-01-03 ENCOUNTER — Telehealth (HOSPITAL_BASED_OUTPATIENT_CLINIC_OR_DEPARTMENT_OTHER): Payer: Self-pay | Admitting: Cardiology

## 2024-01-03 NOTE — Telephone Encounter (Signed)
 I called and spoke w the patient.  I asked her to read me the label on lisinopril  bottle.  She removes labels from empty bottles before throwing them away, and said evidently I through the wrong label away.  She can't find the label, has not taken any 2 tablets but wanted to check why it said it.   I asked her if she maybe had been reading the Eliquis  bottle by mistake but she said no.    She will continue on lisinopril  5 mg daily and will call/lwrite in if any further concerns.

## 2024-01-03 NOTE — Telephone Encounter (Signed)
 Pt c/o medication issue:  1. Name of Medication:   lisinopril  (ZESTRIL ) 5 MG tablet   2. How are you currently taking this medication (dosage and times per day)?   3. Are you having a reaction (difficulty breathing--STAT)?   4. What is your medication issue?   Patient stated her prescription bottle shows she should be taking this medication twice daily and she was not told her medication was increased.  Patient wants to confirm instructions for this medication.

## 2024-01-03 NOTE — Telephone Encounter (Signed)
 Patient sent a MyChart message as well.  Will send her a message there too.    Returned call and got VM.  I do not see where her Lisinopril  was increased just as she is saying.  Did leave a message stating that I wanted to connect with her to discuss.

## 2024-01-10 ENCOUNTER — Other Ambulatory Visit: Payer: Self-pay | Admitting: Radiation Oncology

## 2024-01-10 ENCOUNTER — Inpatient Hospital Stay
Admission: RE | Admit: 2024-01-10 | Discharge: 2024-01-10 | Disposition: A | Discharge status: Home / Self Care | Payer: Self-pay | Source: Ambulatory Visit | Attending: Radiation Oncology | Admitting: Radiation Oncology

## 2024-01-10 ENCOUNTER — Telehealth: Payer: Self-pay | Admitting: Radiation Oncology

## 2024-01-10 ENCOUNTER — Inpatient Hospital Stay
Admission: RE | Admit: 2024-01-10 | Discharge: 2024-01-10 | Disposition: A | Payer: Self-pay | Source: Ambulatory Visit | Attending: Radiation Oncology | Admitting: Radiation Oncology

## 2024-01-10 DIAGNOSIS — M5416 Radiculopathy, lumbar region: Secondary | ICD-10-CM

## 2024-01-10 NOTE — Telephone Encounter (Signed)
 10/28 sent via stat fax requested for recent MRI/Xray images to be pushed to powershare from [facility].  Follow up call to [facility], images not in powershare to upload to epic.  Will follow up with EO.

## 2024-01-11 NOTE — Progress Notes (Signed)
 Radiation Oncology         [phone] ________________________________  Initial Outpatient Consultation Note  Name: Katherine Mcpherson MRN: [id number]  Date: 01/12/2024  DOB: 22-Oct-1945  RR:Tzoanmw, Bernardino, DO  Laqueta Ozell BIRCH, MD   REFERRING PHYSICIAN: Laqueta Ozell BIRCH, MD  DIAGNOSIS: The primary encounter diagnosis was Osteoarthritis of lumbar spine, unspecified spinal osteoarthritis complication status. A diagnosis of Lumbar radiculopathy was also pertinent to this visit.  Osteoarthritis of the sacro-lumbar spine.   HISTORY OF PRESENT ILLNESS::Katherine Mcpherson is a 78 y.o. female who is seen as a courtesy of Dr. Laqueta for an opinion concerning radiation therapy as part of management for her back arthritis.  Patient has a long term history of back pain secondary to significant scoliotic deformity of thoracolumbar spine. She undergone 3 separate surgeries for treatment of pain with her initial surgery as L5-S1 fusion which was then extended to T6.    She also receives epidural steroid injection with most recent injection performed on 12/07/2023 to the right L3-4.  She reports improvements of her right low back pain by roughly 50% but reports significant pain in the right low back near the sacroiliac joint/superior gluteal musculature that has not been relieved by injections.     She presented for a follow up with Dr. Laqueta on 10/20 who recommended undergoing radiation to the lumbar region to relief arthritis pain.   Pertinent imaging performed include: --MRI lumbar spine done on 10/31/2023: showing previous posterior instrumented rod and screw fusion extending from at least T11-S1. 8 mm synovial cyst in the left posterior paramedian midline spinal canal at L3-4 causing mild mass effect on adjacent descending nerve roots. At L3-4 facet degenerative hypertrophy and residual endplate osteophytes cause at least moderate right foraminal stenosis and mild left foraminal stenosis. There is  paravertebral edema on the right side of the disc at this level which appears to be due to pseudoarthrosis between large marginal endplate osteophytes. At L5-S1 residual endplate osteophytes at least moderately narrows the right neural foramen. At L2-3 facet degenerative hypertrophy causes mild to moderate foraminal stenosis on the left.  --X-ray thoracic spine done on 10/13/2023: Extensive thoracolumbar fusion extending out of the borders of the image, fusion extends from S1 to at least T8. Hardware appears in good alignment without evidence of malposition or loosening.   Patient notes low back pain along the right lower back near the sacroiliac joint. She can walk down her driveway until she starts to have pain. She is taking Tylenol  650 mg BID-TID and Tramadol  50 mg PRN. She tries to avoid this as much as possible and states that she sometimes cuts this in half. She notes worsening pain with the weather changes. She denies any numbness or weakness in her bilateral legs.    PREVIOUS RADIATION THERAPY: No  PAST MEDICAL HISTORY:  Past Medical History:  Diagnosis Date   Allergy    Anemia    comes and goes (01/22/2013)   Aortic regurgitation 07/21/2018   Arthritis    qwhere (01/22/2013)   Chronic lower back pain    ongoing since back OR 2013 (01/22/2013)   Fall from slip, trip, or stumble 01/11/2013   stubbed toe at gas pump at [employer] (01/22/2013)   Gastroesophageal reflux disease 08/30/2016   Seen by Dr. Renaye Sous 08/17/2016   GERD (gastroesophageal reflux disease)    H/O hiatal hernia    High cholesterol    Hypertension    Hypertensive heart disease 12/30/2011   Long term  current use of anticoagulant therapy 09/10/2016   Low back pain 12/15/2011   Dr. Debby Loupe  LBP with RLE pain with L5-S1 spondyiolithesis and scoliosis.    Menopausal state 12/30/2011   Obesity, Class I, BMI 30-34.9 12/30/2011   Paroxysmal atrial fibrillation (HCC) 09/10/2016   Polyp of colon 10/2009   PONV  (postoperative nausea and vomiting)    Pure hypercholesterolemia 09/10/2016   Torn rotator cuff 03/13/2019   Trochanteric bursitis of left hip 09/10/2016   Vaginal atrophy 12/30/2011    PAST SURGICAL HISTORY: Past Surgical History:  Procedure Laterality Date   ABDOMINAL HYSTERECTOMY  1999   TAH/BSO   ANKLE FRACTURE SURGERY Right 1985   ANKLE HARDWARE REMOVAL Right 1986   COLONOSCOPY  08/2007   TUBULAR ADENOMAWITH DYSPLASIA   FOOT FRACTURE SURGERY Left 1986   piece of bone removed (08/03/2012)   FRACTURE SURGERY Bilateral    HAMMER TOE SURGERY Right 2002   JOINT REPLACEMENT     KNEE ARTHROSCOPY Left 2001   POSTERIOR LUMBAR FUSION  2013   L 5-S1   RADIOFREQUENCY ABLATION NERVES     6 week ago 07/31/14   SPINE SURGERY  01/2012   TOTAL KNEE ARTHROPLASTY Right 07/31/2012   Procedure: TOTAL KNEE ARTHROPLASTY;  Surgeon: Dempsey JINNY Sensor, MD;  Location: [facility] OR;  Service: Orthopedics;  Laterality: Right;  DEPUY/SIGMA   TOTAL KNEE ARTHROPLASTY Left 01/22/2013   TOTAL KNEE ARTHROPLASTY Left 01/22/2013   Procedure: LEFT TOTAL KNEE ARTHROPLASTY;  Surgeon: Dempsey JINNY Sensor, MD;  Location: [facility] OR;  Service: Orthopedics;  Laterality: Left;    FAMILY HISTORY:  Family History  Problem Relation Age of Onset   Osteoporosis Mother    Hypertension Mother    Hypertension Sister    Hypertension Sister    Heart disease Father    Hypertension Father    Hyperlipidemia Father    Cancer Paternal Uncle        PANCREATIC    SOCIAL HISTORY:  Social History   Tobacco Use   Smoking status: Former    Current packs/day: 0.00    Average packs/day: 0.5 packs/day for 8.0 years (4.0 ttl pk-yrs)    Types: Cigarettes    Start date: 03/15/1964    Quit date: 03/15/1972    Years since quitting: 51.8    Passive exposure: Past   Smokeless tobacco: Never  Vaping Use   Vaping status: Never Used  Substance Use Topics   Alcohol use: Yes    Comment: occ/ rare wine-2 glasses of wine per month   Drug use: No     ALLERGIES:  Allergies  Allergen Reactions   Sulfa Antibiotics Other (See Comments)    Makes tongue feel funny and makes her feel like she will pass  Out.  Last for days. Other reaction(s): dizziness   Penicillins Itching    Childhood allergy.    Celecoxib Other (See Comments)    Pt declined to take because of sulfa allergy   Tape     Other reaction(s): Other (See Comments) Causes skin tear   Latex Rash    Had rash on lft side only after back surgery   Nsaids Rash   Povidone-Iodine Itching    MEDICATIONS:  Current Outpatient Medications  Medication Sig Dispense Refill   acetaminophen  (TYLENOL ) 650 MG CR tablet Take 650 mg by mouth 2 (two) times daily as needed for pain.     bismuth subsalicylate (PEPTO BISMOL) 262 MG chewable tablet Chew 1 tablet by mouth daily.  ELIQUIS  5 MG TABS tablet Take 1 tablet (5 mg total) by mouth 2 (two) times daily. 180 tablet 3   ezetimibe  (ZETIA ) 10 MG tablet Take 0.5 tablets (5 mg total) by mouth daily for 1 dose. 45 tablet 3   fluticasone  (FLONASE ) 50 MCG/ACT nasal spray Place 1 spray into both nostrils daily as needed for allergies or rhinitis.     lisinopril  (ZESTRIL ) 5 MG tablet Take 1 tablet (5 mg total) by mouth daily. 90 tablet 3   methocarbamol  (ROBAXIN ) 500 MG tablet Take 500 mg by mouth.     metoprolol  tartrate (LOPRESSOR ) 25 MG tablet Take 0.5 tablets (12.5 mg total) by mouth 2 (two) times daily. 90 tablet 3   carisoprodol  (SOMA ) 350 MG tablet Take 350 mg by mouth 3 (three) times daily as needed for muscle spasms. (Patient not taking: Reported on 01/12/2024)     famotidine (PEPCID) 20 MG tablet Take 20 mg by mouth daily as needed for heartburn.     loratadine  (CLARITIN ) 10 MG tablet Take 10 mg by mouth daily as needed for allergies.     No current facility-administered medications for this encounter.    REVIEW OF SYSTEMS: Notable for that above.    PHYSICAL EXAM:  height is 5' 9 (1.753 m) (pended) and weight is 224 lb 4 oz  (101.7 kg) (pended). Her temporal temperature is 97.5 F (36.4 C) (abnormal, pended). Her blood pressure is 157/77 (abnormal, pended) and her pulse is 59 (abnormal, pended). Her respiration is 18 (pended).   General: Alert and oriented, in no acute distress HEENT: Head is normocephalic. Extraocular movements are intact.  Neck: Neck is supple, no palpable cervical or supraclavicular lymphadenopathy. Heart: Regular in rate and rhythm with no murmurs, rubs, or gallops. Chest: Clear to auscultation bilaterally, with no rhonchi, wheezes, or rales. Abdomen: Soft, nontender, nondistended, with no rigidity or guarding. Extremities: No cyanosis or edema. Lymphatics: see Neck Exam Skin: No concerning lesions. Musculoskeletal: 5/5 bilateral lower extremity strength. Dorsiflexion intact.  Neurologic: Cranial nerves II through XII are grossly intact. No obvious focalities. Speech is fluent. Coordination is intact. Sensation grossly intact. Psychiatric: Judgment and insight are intact. Affect is appropriate.   ECOG = 1-2  0 - Asymptomatic (Fully active, able to carry on all predisease activities without restriction)  1 - Symptomatic but completely ambulatory (Restricted in physically strenuous activity but ambulatory and able to carry out work of a light or sedentary nature. For example, light housework, office work)  2 - Symptomatic, <50% in bed during the day (Ambulatory and capable of all self care but unable to carry out any work activities. Up and about more than 50% of waking hours)  3 - Symptomatic, >50% in bed, but not bedbound (Capable of only limited self-care, confined to bed or chair 50% or more of waking hours)  4 - Bedbound (Completely disabled. Cannot carry on any self-care. Totally confined to bed or chair)  5 - Death   Raylene MM, Creech RH, Tormey DC, et al. [phone]). Toxicity and response criteria of the [facility]. Am. DOROTHA Bridges. Oncol. 5 (6):  649-55  LABORATORY DATA:  Lab Results  Component Value Date   WBC 7.0 09/02/2022   HGB 12.9 09/02/2022   HCT 40.9 09/02/2022   MCV 93 09/02/2022   PLT 272 09/02/2022   NEUTROABS WILL FOLLOW 04/06/2016   Lab Results  Component Value Date   NA 140 09/27/2022   K 5.2 09/27/2022   CL 103 09/27/2022  CO2 20 09/27/2022   GLUCOSE 79 09/27/2022   BUN 30 (H) 09/27/2022   CREATININE 1.26 (H) 09/27/2022   CALCIUM  9.2 09/27/2022      RADIOGRAPHY: No results found.    IMPRESSION: Osteoarthritis of the sacro-lumbar spine.    Patient has a long history of lower back pain and has undergone multiple surgeries with little relief. She presents today after a L3-4 transforaminal ESI with considerable ongoing residual pain. Dr. Shannon offers a treatment directed at the lumbar spine and bilateral sacroiliac joints in efforts to reduce her pain.   We discussed the natural history of arthritis and general treatment, highlighting the role of radiotherapy in the management.   We reviewed the possible side effects, which include but are not limited to; skin irritation, fatigue, nausea, diarrhea, or damage to any tissue in the radiation field. Although extremely rare, we also discussed the risk of a secondary malignancy from radiation exposure. Patient understands that pain relief is generally temporary and may last up to two years after completing treatment.   The patient was encouraged to ask questions that I answered to the best of my ability. A patient consent form was discussed and signed. We retained a copy for our records.  The patient would like to proceed with radiation if financially feasible. Our billing department is working to get this treatment prior authorized and an price estimate for the patient.   PLAN: We will wait to hear back from the patient on whether or not she would like to proceed with treatment. Dr. Shannon anticipates a course of 6 radiation treatments, low dose, given every  other day. We look forward to participating in this patient's care.      60 minutes of total time was spent for this patient encounter, including preparation, face-to-face counseling with the patient and coordination of care, physical exam, and documentation of the encounter.   ------------------------------------------------   Leeroy Due, PA-C   Lynwood CHARM Shannon, PhD, MD   [facility]  Radiation Oncology Direct Dial: [phone]  Fax: [phone] [email or web address]    This document serves as a record of services personally performed by Lynwood Shannon, MD. It was created on his behalf by Reymundo Cartwright, a trained medical scribe. The creation of this record is based on the scribe's personal observations and the provider's statements to them. This document has been checked and approved by the attending provider.

## 2024-01-11 NOTE — Progress Notes (Incomplete)
     Pain on a scale of 0-10 is: {Number; 1-10  not applicable:20727}      Ambulatory status? Walker? Wheelchair?: {VQI Ambulatory Status:20974}  SAFETY ISSUES: Prior radiation? {:18581} Pacemaker/ICD? {:18581} Possible current pregnancy? {:18581} Is the patient on methotrexate? {:18581}  Current Complaints / other details:  ***

## 2024-01-12 ENCOUNTER — Telehealth: Payer: Self-pay | Admitting: Radiation Oncology

## 2024-01-12 ENCOUNTER — Ambulatory Visit
Admission: RE | Admit: 2024-01-12 | Discharge: 2024-01-12 | Disposition: A | Discharge status: Home / Self Care | Source: Ambulatory Visit | Attending: Radiation Oncology | Admitting: Radiation Oncology

## 2024-01-12 ENCOUNTER — Encounter: Payer: Self-pay | Admitting: Radiation Oncology

## 2024-01-12 DIAGNOSIS — M47897 Other spondylosis, lumbosacral region: Secondary | ICD-10-CM | POA: Insufficient documentation

## 2024-01-12 DIAGNOSIS — R197 Diarrhea, unspecified: Secondary | ICD-10-CM | POA: Insufficient documentation

## 2024-01-12 DIAGNOSIS — R5383 Other fatigue: Secondary | ICD-10-CM | POA: Diagnosis not present

## 2024-01-12 DIAGNOSIS — M5416 Radiculopathy, lumbar region: Secondary | ICD-10-CM

## 2024-01-12 DIAGNOSIS — R110 Nausea: Secondary | ICD-10-CM | POA: Insufficient documentation

## 2024-01-12 DIAGNOSIS — M47816 Spondylosis without myelopathy or radiculopathy, lumbar region: Secondary | ICD-10-CM

## 2024-01-12 NOTE — Telephone Encounter (Signed)
 10/30 F/U call to [facility], spoke to [place], still not in [place].  F/U call to [facility] to request CD to be sent to [facility] via mail, if issue with pushing images to powershare.  Waiting on cd.

## 2024-01-12 NOTE — Telephone Encounter (Signed)
 10/30 @ 12:53 pm Follow up call to [facility] spoke to [place], reaching out to their supervisor to send images to [employer].  Follow up call to [facility] to attach images to PACs in epic.

## 2024-01-13 DIAGNOSIS — M47816 Spondylosis without myelopathy or radiculopathy, lumbar region: Secondary | ICD-10-CM | POA: Insufficient documentation

## 2024-01-16 ENCOUNTER — Encounter: Payer: Self-pay | Admitting: Radiation Oncology

## 2024-01-16 ENCOUNTER — Ambulatory Visit: Admitting: Radiation Oncology

## 2024-01-17 NOTE — Addendum Note (Signed)
 Encounter addended by: Dyana Norse, LPN on: 88/07/7972 9:17 AM  Actions taken: Order list changed

## 2024-01-23 ENCOUNTER — Ambulatory Visit
Admission: RE | Admit: 2024-01-23 | Discharge: 2024-01-23 | Disposition: A | Discharge status: Home / Self Care | Source: Ambulatory Visit | Attending: Radiation Oncology | Admitting: Radiation Oncology

## 2024-01-23 DIAGNOSIS — M4726 Other spondylosis with radiculopathy, lumbar region: Secondary | ICD-10-CM | POA: Insufficient documentation

## 2024-01-23 DIAGNOSIS — Z510 Encounter for antineoplastic radiation therapy: Secondary | ICD-10-CM | POA: Diagnosis not present

## 2024-01-23 DIAGNOSIS — M5416 Radiculopathy, lumbar region: Secondary | ICD-10-CM | POA: Diagnosis not present

## 2024-01-23 DIAGNOSIS — M47816 Spondylosis without myelopathy or radiculopathy, lumbar region: Secondary | ICD-10-CM

## 2024-01-26 DIAGNOSIS — M5416 Radiculopathy, lumbar region: Secondary | ICD-10-CM | POA: Diagnosis not present

## 2024-01-26 DIAGNOSIS — Z510 Encounter for antineoplastic radiation therapy: Secondary | ICD-10-CM | POA: Diagnosis not present

## 2024-01-30 ENCOUNTER — Ambulatory Visit
Admission: RE | Admit: 2024-01-30 | Discharge: 2024-01-30 | Disposition: A | Discharge status: Home / Self Care | Source: Ambulatory Visit | Attending: Radiation Oncology | Admitting: Radiation Oncology

## 2024-01-30 ENCOUNTER — Other Ambulatory Visit: Payer: Self-pay

## 2024-01-30 DIAGNOSIS — M5416 Radiculopathy, lumbar region: Secondary | ICD-10-CM | POA: Diagnosis not present

## 2024-01-30 DIAGNOSIS — M47816 Spondylosis without myelopathy or radiculopathy, lumbar region: Secondary | ICD-10-CM

## 2024-01-30 DIAGNOSIS — Z510 Encounter for antineoplastic radiation therapy: Secondary | ICD-10-CM | POA: Diagnosis not present

## 2024-01-30 LAB — RAD ONC ARIA SESSION SUMMARY
Course Elapsed Days: 0
Plan Fractions Treated to Date: 1
Plan Prescribed Dose Per Fraction: 0.5 Gy
Plan Total Fractions Prescribed: 6
Plan Total Prescribed Dose: 3 Gy
Reference Point Dosage Given to Date: 0.5 Gy
Reference Point Session Dosage Given: 0.5 Gy
Session Number: 1

## 2024-01-31 ENCOUNTER — Ambulatory Visit: Admitting: Radiation Oncology

## 2024-02-01 ENCOUNTER — Ambulatory Visit

## 2024-02-01 ENCOUNTER — Ambulatory Visit
Admission: RE | Admit: 2024-02-01 | Discharge: 2024-02-01 | Disposition: A | Discharge status: Home / Self Care | Source: Ambulatory Visit | Attending: Radiation Oncology | Admitting: Radiation Oncology

## 2024-02-01 ENCOUNTER — Other Ambulatory Visit: Payer: Self-pay

## 2024-02-01 ENCOUNTER — Ambulatory Visit
Admission: RE | Admit: 2024-02-01 | Discharge: 2024-02-01 | Disposition: A | Source: Ambulatory Visit | Attending: Radiation Oncology | Admitting: Radiation Oncology

## 2024-02-01 DIAGNOSIS — M5416 Radiculopathy, lumbar region: Secondary | ICD-10-CM | POA: Diagnosis not present

## 2024-02-01 DIAGNOSIS — Z510 Encounter for antineoplastic radiation therapy: Secondary | ICD-10-CM | POA: Diagnosis not present

## 2024-02-01 LAB — RAD ONC ARIA SESSION SUMMARY
Course Elapsed Days: 2
Plan Fractions Treated to Date: 2
Plan Prescribed Dose Per Fraction: 0.5 Gy
Plan Total Fractions Prescribed: 6
Plan Total Prescribed Dose: 3 Gy
Reference Point Dosage Given to Date: 1 Gy
Reference Point Session Dosage Given: 0.5 Gy
Session Number: 2

## 2024-02-02 ENCOUNTER — Ambulatory Visit

## 2024-02-03 ENCOUNTER — Other Ambulatory Visit: Payer: Self-pay

## 2024-02-03 ENCOUNTER — Ambulatory Visit
Admission: RE | Admit: 2024-02-03 | Discharge: 2024-02-03 | Disposition: A | Discharge status: Home / Self Care | Source: Ambulatory Visit | Attending: Radiation Oncology | Admitting: Radiation Oncology

## 2024-02-03 ENCOUNTER — Ambulatory Visit

## 2024-02-03 DIAGNOSIS — Z510 Encounter for antineoplastic radiation therapy: Secondary | ICD-10-CM | POA: Diagnosis not present

## 2024-02-03 DIAGNOSIS — M5416 Radiculopathy, lumbar region: Secondary | ICD-10-CM | POA: Diagnosis not present

## 2024-02-03 LAB — RAD ONC ARIA SESSION SUMMARY
Course Elapsed Days: 4
Plan Fractions Treated to Date: 3
Plan Prescribed Dose Per Fraction: 0.5 Gy
Plan Total Fractions Prescribed: 6
Plan Total Prescribed Dose: 3 Gy
Reference Point Dosage Given to Date: 1.5 Gy
Reference Point Session Dosage Given: 0.5 Gy
Session Number: 3

## 2024-02-04 ENCOUNTER — Encounter: Payer: Self-pay | Admitting: Radiation Oncology

## 2024-02-06 ENCOUNTER — Other Ambulatory Visit: Payer: Self-pay

## 2024-02-06 ENCOUNTER — Ambulatory Visit
Admission: RE | Admit: 2024-02-06 | Discharge: 2024-02-06 | Disposition: A | Source: Ambulatory Visit | Attending: Radiation Oncology | Admitting: Radiation Oncology

## 2024-02-06 ENCOUNTER — Other Ambulatory Visit: Payer: Self-pay | Admitting: Radiation Oncology

## 2024-02-06 DIAGNOSIS — M5416 Radiculopathy, lumbar region: Secondary | ICD-10-CM | POA: Diagnosis not present

## 2024-02-06 DIAGNOSIS — Z510 Encounter for antineoplastic radiation therapy: Secondary | ICD-10-CM | POA: Diagnosis not present

## 2024-02-06 LAB — RAD ONC ARIA SESSION SUMMARY
Course Elapsed Days: 7
Plan Fractions Treated to Date: 4
Plan Prescribed Dose Per Fraction: 0.5 Gy
Plan Total Fractions Prescribed: 6
Plan Total Prescribed Dose: 3 Gy
Reference Point Dosage Given to Date: 2 Gy
Reference Point Session Dosage Given: 0.5 Gy
Session Number: 4

## 2024-02-06 MED ORDER — TRAMADOL HCL 50 MG PO TABS: 50.0000 mg | ORAL_TABLET | Freq: Two times a day (BID) | ORAL | 0 refills | Status: AC

## 2024-02-07 ENCOUNTER — Ambulatory Visit

## 2024-02-08 ENCOUNTER — Other Ambulatory Visit: Payer: Self-pay

## 2024-02-08 ENCOUNTER — Ambulatory Visit
Admission: RE | Admit: 2024-02-08 | Discharge: 2024-02-08 | Disposition: A | Discharge status: Home / Self Care | Source: Ambulatory Visit | Attending: Radiation Oncology | Admitting: Radiation Oncology

## 2024-02-08 ENCOUNTER — Ambulatory Visit

## 2024-02-08 DIAGNOSIS — M5416 Radiculopathy, lumbar region: Secondary | ICD-10-CM | POA: Diagnosis not present

## 2024-02-08 DIAGNOSIS — Z510 Encounter for antineoplastic radiation therapy: Secondary | ICD-10-CM | POA: Diagnosis not present

## 2024-02-08 LAB — RAD ONC ARIA SESSION SUMMARY
Course Elapsed Days: 9
Plan Fractions Treated to Date: 5
Plan Prescribed Dose Per Fraction: 0.5 Gy
Plan Total Fractions Prescribed: 6
Plan Total Prescribed Dose: 3 Gy
Reference Point Dosage Given to Date: 2.5 Gy
Reference Point Session Dosage Given: 0.5 Gy
Session Number: 5

## 2024-02-10 ENCOUNTER — Ambulatory Visit

## 2024-02-13 ENCOUNTER — Ambulatory Visit
Admission: RE | Admit: 2024-02-13 | Discharge: 2024-02-13 | Disposition: A | Source: Ambulatory Visit | Attending: Radiation Oncology | Admitting: Radiation Oncology

## 2024-02-13 ENCOUNTER — Ambulatory Visit

## 2024-02-13 ENCOUNTER — Other Ambulatory Visit: Payer: Self-pay

## 2024-02-13 DIAGNOSIS — Z510 Encounter for antineoplastic radiation therapy: Secondary | ICD-10-CM | POA: Diagnosis not present

## 2024-02-13 DIAGNOSIS — M4726 Other spondylosis with radiculopathy, lumbar region: Secondary | ICD-10-CM | POA: Diagnosis not present

## 2024-02-13 DIAGNOSIS — M5416 Radiculopathy, lumbar region: Secondary | ICD-10-CM | POA: Diagnosis not present

## 2024-02-13 LAB — RAD ONC ARIA SESSION SUMMARY
Course Elapsed Days: 14
Plan Fractions Treated to Date: 6
Plan Prescribed Dose Per Fraction: 0.5 Gy
Plan Total Fractions Prescribed: 6
Plan Total Prescribed Dose: 3 Gy
Reference Point Dosage Given to Date: 3 Gy
Reference Point Session Dosage Given: 0.5 Gy
Session Number: 6

## 2024-02-14 NOTE — Radiation Completion Notes (Addendum)
" °  Radiation Oncology         [phone] ________________________________  Name: Katherine Mcpherson MRN: [id number]  Date of Service: 02/13/2024  DOB: December 26, 1945  End of Treatment Note   Diagnosis: Osteoarthritis of the sacro-lumbar spine.  Intent: Curative     ==========DELIVERED PLANS==========  First Treatment Date: 2024-01-30 Last Treatment Date: 2024-02-13   Plan Name: Spine_L Site: Lumbar Spine Technique: 3D Mode: Photon Dose Per Fraction: 0.5 Gy Prescribed Dose (Delivered / Prescribed): 3 Gy / 3 Gy Prescribed Fxs (Delivered / Prescribed): 6 / 6    ====================================   The patient tolerated radiation. She developed increasing fatigue and lower back pain, suspected to be from the treatment table. She was given a limited prescription of tramadol  for this issue.   The patient will return in one month and will continue follow up with Dr. Laqueta as well.      Ronita Due, PA-C "

## 2024-02-15 ENCOUNTER — Ambulatory Visit

## 2024-03-19 ENCOUNTER — Ambulatory Visit: Admitting: Radiation Oncology

## 2024-03-20 ENCOUNTER — Encounter: Payer: Self-pay | Admitting: Radiation Oncology

## 2024-03-21 NOTE — Telephone Encounter (Signed)
 Good morning ladies, can someone please send patient a copy of her MRI outside films that were done in October?

## 2024-04-10 ENCOUNTER — Telehealth: Payer: Self-pay

## 2024-04-10 ENCOUNTER — Encounter (HOSPITAL_BASED_OUTPATIENT_CLINIC_OR_DEPARTMENT_OTHER): Payer: Self-pay | Admitting: Cardiology

## 2024-04-10 NOTE — Telephone Encounter (Signed)
 Patient called in to ask if she still needed to come in for 1 month follow up after receiving low dose radiation to her spine. Per patient she has a broken rod in her back and has to be seen by a careers adviser at [facility] to discuss treatment plan. Patient states she continues to have pain but thinks its related to the broken rod in her back.

## 2024-04-15 ENCOUNTER — Ambulatory Visit: Admitting: Radiation Oncology

## 2024-04-16 ENCOUNTER — Ambulatory Visit: Admitting: Radiation Oncology

## 2024-04-16 ENCOUNTER — Telehealth (HOSPITAL_BASED_OUTPATIENT_CLINIC_OR_DEPARTMENT_OTHER): Payer: Self-pay | Admitting: *Deleted

## 2024-04-16 ENCOUNTER — Telehealth (HOSPITAL_BASED_OUTPATIENT_CLINIC_OR_DEPARTMENT_OTHER): Payer: Self-pay

## 2024-04-16 NOTE — Telephone Encounter (Signed)
" ° °  Name: Katherine Mcpherson  DOB: 07-27-1945  MRN: [id number]  Primary Cardiologist: Shelda Bruckner, MD   Preoperative team, please contact this patient and set up a phone call appointment for further preoperative risk assessment. Please obtain consent and complete medication review. Thank you for your help.  I confirm that guidance regarding antiplatelet and oral anticoagulation therapy has been completed and, if necessary, noted below.  Per office protocol, patient can hold Eliquis  for 3 days prior to procedure.   Patient will not need bridging with Lovenox (enoxaparin) around procedure.  I also confirmed the patient resides in the state of [place] . As per [facility] Medical Board telemedicine laws, the patient must reside in the state in which the provider is licensed.   Josefa CHRISTELLA Beauvais, NP 04/16/2024, 11:39 AM [facility]    "

## 2024-04-16 NOTE — Telephone Encounter (Signed)
 Patient with diagnosis of atrial fibrillation on Eliquis  for anticoagulation.    Procedure:  L4-5 ALIF; L4 & L5 plate & screw instrumentation; removal of L4-5   Date of Surgery:  Clearance 06/15/24     CHA2DS2-VASc Score = 5   This indicates a 7.2% annual risk of stroke. The patient's score is based upon: CHF History: 0 HTN History: 1 Diabetes History: 0 Stroke History: 0 Vascular Disease History: 1 Age Score: 2 Gender Score: 1    CrCl 63 Platelet count 290  Patient has not had an Afib/aflutter ablation in the last 3 months, DCCV within the last 4 weeks or a watchman implanted in the last 45 days   Per office protocol, patient can hold Eliquis  for 3 days prior to procedure.   Patient will not need bridging with Lovenox (enoxaparin) around procedure.  For holds greater than 3 days, Cardiologist would need to provide input  **This guidance is not considered finalized until pre-operative APP has relayed final recommendations.**

## 2024-04-16 NOTE — Telephone Encounter (Signed)
 Please advise holding Eliquis  prior to L4-5 ALIF; L4 & L5 plate & screw instrumentation; removal of L4-5 on 06/15/2024 Last labs July 2025 on [facility] site.   Thank you!  DW r

## 2024-04-16 NOTE — Telephone Encounter (Signed)
"  ° °  Pre-operative Risk Assessment    Patient Name: Katherine Mcpherson  DOB: [birth year] MRN: [id number]   Date of last office visit: 11/03/2023 with Dr. Lonni Date of next office visit: None  Request for Surgical Clearance    Procedure:  L4-5 ALIF; L4 & L5 plate & screw instrumentation; removal of L4-5  Date of Surgery:  Clearance 06/15/24                                 Surgeon:  ORLINDA Holly Surgeon's Group or Practice Name:  [facility]  Phone number:  [phone] Fax number:  [phone]   Type of Clearance Requested:   - Medical  - Pharmacy:  Hold Apixaban  (Eliquis ) 7 days preoperatively. May continue 3-5 days postop    Type of Anesthesia:  does not specify   Additional requests/questions:  None  Signed, Patrcia Iverson CROME   04/16/2024, 9:45 AM   "

## 2024-05-25 ENCOUNTER — Ambulatory Visit
